# Patient Record
Sex: Male | Born: 1935
Health system: Southern US, Community
[De-identification: ages and names within clinical notes are randomized; demographics above are authoritative.]

## PROBLEM LIST (undated history)

## (undated) DIAGNOSIS — H9190 Unspecified hearing loss, unspecified ear: Secondary | ICD-10-CM

## (undated) DIAGNOSIS — I639 Cerebral infarction, unspecified: Secondary | ICD-10-CM

## (undated) DIAGNOSIS — I10 Essential (primary) hypertension: Secondary | ICD-10-CM

## (undated) DIAGNOSIS — N289 Disorder of kidney and ureter, unspecified: Secondary | ICD-10-CM

## (undated) DIAGNOSIS — E7800 Pure hypercholesterolemia, unspecified: Secondary | ICD-10-CM

## (undated) DIAGNOSIS — H269 Unspecified cataract: Secondary | ICD-10-CM

## (undated) DIAGNOSIS — K469 Unspecified abdominal hernia without obstruction or gangrene: Secondary | ICD-10-CM

## (undated) DIAGNOSIS — K219 Gastro-esophageal reflux disease without esophagitis: Secondary | ICD-10-CM

## (undated) DIAGNOSIS — I5032 Chronic diastolic (congestive) heart failure: Secondary | ICD-10-CM

## (undated) HISTORY — PX: CHOLECYSTECTOMY: SHX55

## (undated) HISTORY — PX: EYE SURGERY: SHX253

## (undated) HISTORY — PX: HERNIA REPAIR: SHX51

## (undated) SURGERY — Surgical Case
Anesthesia: *Unknown

---

## 1998-05-05 ENCOUNTER — Emergency Department (HOSPITAL_COMMUNITY): Admission: EM | Admit: 1998-05-05 | Discharge: 1998-05-05 | Payer: Self-pay | Admitting: Emergency Medicine

## 1998-05-07 ENCOUNTER — Ambulatory Visit (HOSPITAL_COMMUNITY): Admission: RE | Admit: 1998-05-07 | Discharge: 1998-05-07 | Payer: Self-pay | Admitting: Emergency Medicine

## 1998-05-15 ENCOUNTER — Observation Stay (HOSPITAL_COMMUNITY): Admission: RE | Admit: 1998-05-15 | Discharge: 1998-05-16 | Payer: Self-pay | Admitting: *Deleted

## 2007-11-05 ENCOUNTER — Emergency Department (HOSPITAL_COMMUNITY): Admission: EM | Admit: 2007-11-05 | Discharge: 2007-11-05 | Payer: Self-pay | Admitting: Emergency Medicine

## 2007-11-16 ENCOUNTER — Emergency Department (HOSPITAL_COMMUNITY): Admission: EM | Admit: 2007-11-16 | Discharge: 2007-11-16 | Payer: Self-pay | Admitting: Emergency Medicine

## 2007-11-23 ENCOUNTER — Encounter: Payer: Self-pay | Admitting: Pulmonary Disease

## 2007-12-02 ENCOUNTER — Ambulatory Visit: Payer: Self-pay | Admitting: Pulmonary Disease

## 2007-12-02 DIAGNOSIS — J209 Acute bronchitis, unspecified: Secondary | ICD-10-CM

## 2007-12-02 DIAGNOSIS — K219 Gastro-esophageal reflux disease without esophagitis: Secondary | ICD-10-CM | POA: Insufficient documentation

## 2007-12-02 DIAGNOSIS — T17308A Unspecified foreign body in larynx causing other injury, initial encounter: Secondary | ICD-10-CM

## 2007-12-10 ENCOUNTER — Telehealth: Payer: Self-pay | Admitting: Pulmonary Disease

## 2007-12-22 ENCOUNTER — Ambulatory Visit: Payer: Self-pay | Admitting: Pulmonary Disease

## 2009-07-19 ENCOUNTER — Inpatient Hospital Stay (HOSPITAL_COMMUNITY): Admission: EM | Admit: 2009-07-19 | Discharge: 2009-07-24 | Payer: Self-pay | Admitting: Emergency Medicine

## 2009-07-19 ENCOUNTER — Ambulatory Visit: Payer: Self-pay | Admitting: Cardiovascular Disease

## 2009-07-20 ENCOUNTER — Encounter (INDEPENDENT_AMBULATORY_CARE_PROVIDER_SITE_OTHER): Payer: Self-pay | Admitting: Internal Medicine

## 2009-07-23 ENCOUNTER — Ambulatory Visit: Payer: Self-pay | Admitting: Physical Medicine & Rehabilitation

## 2009-08-20 ENCOUNTER — Telehealth: Payer: Self-pay | Admitting: Pulmonary Disease

## 2009-08-21 ENCOUNTER — Encounter: Payer: Self-pay | Admitting: Pulmonary Disease

## 2009-08-30 ENCOUNTER — Ambulatory Visit: Payer: Self-pay | Admitting: Pulmonary Disease

## 2009-09-05 ENCOUNTER — Encounter: Payer: Self-pay | Admitting: Pulmonary Disease

## 2009-09-13 ENCOUNTER — Encounter: Payer: Self-pay | Admitting: Pulmonary Disease

## 2010-10-24 NOTE — Miscellaneous (Signed)
Summary: D/C summary/[facility]  D/C summary/[facility]   Imported By: Lester Halesite 09/28/2009 09:48:21  _____________________________________________________________________  External Attachment:    Type:   Image     Comment:   External Document

## 2010-12-26 LAB — POCT I-STAT, CHEM 8
Calcium, Ion: 1.13 mmol/L (ref 1.12–1.32)
Chloride: 103 mEq/L (ref 96–112)
Creatinine, Ser: 1 mg/dL (ref 0.4–1.5)
Glucose, Bld: 118 mg/dL — ABNORMAL HIGH (ref 70–99)
HCT: 46 % (ref 39.0–52.0)
Hemoglobin: 15.6 g/dL (ref 13.0–17.0)
Potassium: 3.8 mEq/L (ref 3.5–5.1)
Sodium: 139 mEq/L (ref 135–145)
TCO2: 25 mmol/L (ref 0–100)

## 2010-12-26 LAB — URINE CULTURE

## 2010-12-26 LAB — CBC
HCT: 44.7 % (ref 39.0–52.0)
Hemoglobin: 15.4 g/dL (ref 13.0–17.0)
MCHC: 34.5 g/dL (ref 30.0–36.0)
MCV: 95.4 fL (ref 78.0–100.0)
Platelets: 254 10*3/uL (ref 150–400)

## 2010-12-26 LAB — URINALYSIS, ROUTINE W REFLEX MICROSCOPIC
Bilirubin Urine: NEGATIVE
Hgb urine dipstick: NEGATIVE
Ketones, ur: NEGATIVE mg/dL
Nitrite: NEGATIVE

## 2010-12-26 LAB — DIFFERENTIAL
Eosinophils Absolute: 0 10*3/uL (ref 0.0–0.7)
Eosinophils Relative: 0 % (ref 0–5)

## 2010-12-26 LAB — LIPID PANEL
Triglycerides: 125 mg/dL (ref <150)
VLDL: 25 mg/dL (ref 0–40)

## 2010-12-26 LAB — HEMOGLOBIN A1C: Hgb A1c MFr Bld: 6 % (ref 4.6–6.1)

## 2010-12-26 LAB — PROTIME-INR
INR: 1.1 (ref 0.00–1.49)
Prothrombin Time: 14.1 seconds (ref 11.6–15.2)

## 2011-06-13 LAB — URINALYSIS, ROUTINE W REFLEX MICROSCOPIC
Glucose, UA: NEGATIVE
Hgb urine dipstick: NEGATIVE
Leukocytes, UA: NEGATIVE
Protein, ur: 100 — AB
pH: 5.5

## 2011-06-13 LAB — DIFFERENTIAL
Basophils Relative: 2 — ABNORMAL HIGH
Eosinophils Relative: 0
Lymphocytes Relative: 3 — ABNORMAL LOW
Monocytes Absolute: 0.5
Monocytes Relative: 3

## 2011-06-13 LAB — COMPREHENSIVE METABOLIC PANEL
ALT: 69 — ABNORMAL HIGH
Alkaline Phosphatase: 75
BUN: 22
CO2: 28
GFR calc Af Amer: >60
Potassium: 4.5

## 2011-06-13 LAB — URINE MICROSCOPIC-ADD ON

## 2011-06-13 LAB — CBC
MCHC: 34.7
MCV: 91.7
Platelets: 358
WBC: 13.5 — ABNORMAL HIGH

## 2012-06-08 ENCOUNTER — Emergency Department (HOSPITAL_COMMUNITY)
Admission: EM | Admit: 2012-06-08 | Discharge: 2012-06-08 | Disposition: A | Discharge status: Home / Self Care | Payer: Medicare Other | Attending: Emergency Medicine | Admitting: Emergency Medicine

## 2012-06-08 ENCOUNTER — Emergency Department (HOSPITAL_COMMUNITY): Payer: Medicare Other

## 2012-06-08 ENCOUNTER — Encounter (HOSPITAL_COMMUNITY): Payer: Self-pay | Admitting: *Deleted

## 2012-06-08 DIAGNOSIS — Z9089 Acquired absence of other organs: Secondary | ICD-10-CM | POA: Insufficient documentation

## 2012-06-08 DIAGNOSIS — Z8673 Personal history of transient ischemic attack (TIA), and cerebral infarction without residual deficits: Secondary | ICD-10-CM | POA: Insufficient documentation

## 2012-06-08 DIAGNOSIS — Z7982 Long term (current) use of aspirin: Secondary | ICD-10-CM | POA: Insufficient documentation

## 2012-06-08 DIAGNOSIS — R109 Unspecified abdominal pain: Secondary | ICD-10-CM | POA: Insufficient documentation

## 2012-06-08 DIAGNOSIS — Z79899 Other long term (current) drug therapy: Secondary | ICD-10-CM | POA: Insufficient documentation

## 2012-06-08 HISTORY — DX: Unspecified abdominal hernia without obstruction or gangrene: K46.9

## 2012-06-08 HISTORY — DX: Cerebral infarction, unspecified: I63.9

## 2012-06-08 HISTORY — DX: Disorder of kidney and ureter, unspecified: N28.9

## 2012-06-08 HISTORY — DX: Gastro-esophageal reflux disease without esophagitis: K21.9

## 2012-06-08 HISTORY — DX: Unspecified hearing loss, unspecified ear: H91.90

## 2012-06-08 HISTORY — DX: Unspecified cataract: H26.9

## 2012-06-08 HISTORY — DX: Pure hypercholesterolemia, unspecified: E78.00

## 2012-06-08 LAB — URINALYSIS, ROUTINE W REFLEX MICROSCOPIC
Bilirubin Urine: NEGATIVE
Glucose, UA: NEGATIVE mg/dL
Hgb urine dipstick: NEGATIVE
Ketones, ur: NEGATIVE mg/dL
Leukocytes, UA: NEGATIVE
Nitrite: NEGATIVE
Protein, ur: NEGATIVE mg/dL
Specific Gravity, Urine: 1.01 (ref 1.005–1.030)
Urobilinogen, UA: 0.2 mg/dL (ref 0.0–1.0)
pH: 6 (ref 5.0–8.0)

## 2012-06-08 LAB — CBC WITH DIFFERENTIAL/PLATELET
Basophils Absolute: 0 10*3/uL (ref 0.0–0.1)
Basophils Relative: 0 % (ref 0–1)
Eosinophils Absolute: 0.1 10*3/uL (ref 0.0–0.7)
Eosinophils Relative: 1 % (ref 0–5)
HCT: 42.8 % (ref 39.0–52.0)
Hemoglobin: 15.4 g/dL (ref 13.0–17.0)
Lymphocytes Relative: 26 % (ref 12–46)
Lymphs Abs: 2.1 10*3/uL (ref 0.7–4.0)
MCH: 32.6 pg (ref 26.0–34.0)
MCHC: 36 g/dL (ref 30.0–36.0)
MCV: 90.7 fL (ref 78.0–100.0)
Monocytes Absolute: 0.8 10*3/uL (ref 0.1–1.0)
Monocytes Relative: 10 % (ref 3–12)
Neutro Abs: 5.1 10*3/uL (ref 1.7–7.7)
Neutrophils Relative %: 63 % (ref 43–77)
Platelets: 268 10*3/uL (ref 150–400)
RBC: 4.72 MIL/uL (ref 4.22–5.81)
RDW: 12.5 % (ref 11.5–15.5)
WBC: 8.2 10*3/uL (ref 4.0–10.5)

## 2012-06-08 LAB — LIPASE, BLOOD: Lipase: 28 U/L (ref 11–59)

## 2012-06-08 LAB — CREATININE, SERUM
Creatinine, Ser: 0.89 mg/dL (ref 0.50–1.35)
GFR calc Af Amer: 89 mL/min — ABNORMAL LOW (ref >90)
GFR calc non Af Amer: 76 mL/min — ABNORMAL LOW (ref >90)

## 2012-06-08 LAB — BUN: BUN: 15 mg/dL (ref 6–23)

## 2012-06-08 MED ORDER — IOHEXOL 300 MG/ML  SOLN
100.0000 mL | Freq: Once | INTRAMUSCULAR | Status: AC | PRN
  Administered 2012-06-08: 100 mL via INTRAVENOUS

## 2012-06-08 NOTE — ED Notes (Signed)
Pt denies n/v

## 2012-06-08 NOTE — ED Notes (Signed)
Pt from home with reports of right lower abdominal pain that started about 3 days ago. Pt reports that he has his appendix but gallbladder has been removed. Pt also endorses hx of strangulated hernia on same side. Pt denies N/V/D.

## 2012-06-08 NOTE — ED Notes (Signed)
Took temp five times with two thermo mers. notified triage RN

## 2012-06-08 NOTE — ED Notes (Signed)
Patient transported to CT 

## 2012-06-18 NOTE — ED Provider Notes (Signed)
History    78 year old male with abdominal pain. Gradual onset about 3 days ago. Relatively persistent since. Pain is across lower abdomen, but worse on the right side. Past history significant for hernia repair. Triage note mentions strangulated hernia but patient's description is not consistent with this. He is status post cholecystectomy. He thinks he still has his appendix. No urinary complaints. No fevers or chills. No nausea or vomiting. CSN: [id number]  Arrival date & time 06/08/12  3086   First MD Initiated Contact with Patient 06/08/12 0900      Chief Complaint  Patient presents with  . Abdominal Pain    right lower    (Consider location/radiation/quality/duration/timing/severity/associated sxs/prior treatment) HPI  Past Medical History  Diagnosis Date  . Stroke   . Renal disorder   . Hernia   . High cholesterol   . Hearing loss   . GERD (gastroesophageal reflux disease)   . Cataracts, bilateral     Past Surgical History  Procedure Date  . Cholecystectomy   . Hernia repair   . Eye surgery     History reviewed. No pertinent family history.  History  Substance Use Topics  . Smoking status: Former Smoker    Quit date: 06/08/1962  . Smokeless tobacco: Never Used  . Alcohol Use: No      Review of Systems  Allergies  Review of patient's allergies indicates no known allergies.  Home Medications   Current Outpatient Rx  Name Route Sig Dispense Refill  . ASPIRIN 325 MG PO TABS Oral Take 325 mg by mouth daily.    Marland Kitchen VITAMIN D 1000 UNITS PO TABS Oral Take 1,000 Units by mouth daily.    Marland Kitchen CINNAMON 500 MG PO CAPS Oral Take 500 mg by mouth daily.    Marland Kitchen GARLIC PO Oral Take 1 tablet by mouth daily.    Marland Kitchen GLUCOSAMINE-CHONDROITIN 500-400 MG PO TABS Oral Take 1 tablet by mouth 3 (three) times daily.    Marland Kitchen OVER THE COUNTER MEDICATION  Mixes vinegar and grape juice in small glass and drinks at night-for metabolism    . SAW PALMETTO (SERENOA REPENS) 500 MG PO CAPS Oral  Take 500 mg by mouth daily.    Marland Kitchen SIMVASTATIN 40 MG PO TABS Oral Take 40 mg by mouth every evening.      BP 156/83  Pulse 78  Temp 92.3 F (33.5 C)  Resp 16  SpO2 98%  Physical Exam  Nursing note and vitals reviewed. Constitutional: He appears well-developed and well-nourished. No distress.  HENT:  Head: Normocephalic and atraumatic.  Eyes: Conjunctivae normal are normal. Right eye exhibits no discharge. Left eye exhibits no discharge.  Neck: Neck supple.  Cardiovascular: Normal rate, regular rhythm and normal heart sounds.  Exam reveals no gallop and no friction rub.   No murmur heard. Pulmonary/Chest: Effort normal and breath sounds normal. No respiratory distress.  Abdominal: Soft. He exhibits no distension. There is tenderness.       Well-healed surgical scars. No distention. Moderate tenderness suprapubically and right lower quadrant. No rebound or guarding. No hernia appreciated.  Genitourinary:       No CVA tenderness. Normal appearing external male genitalia. Normal eye testicles. No tenderness. No concerning lesions noted.  Musculoskeletal: He exhibits no edema and no tenderness.  Neurological: He is alert.  Skin: Skin is warm and dry.  Psychiatric: He has a normal mood and affect. His behavior is normal. Thought content normal.    ED Course  Procedures (including critical care  time)  Labs Reviewed  URINALYSIS, ROUTINE W REFLEX MICROSCOPIC - Abnormal; Notable for the following:    APPearance CLOUDY (*)     All other components within normal limits  CREATININE, SERUM - Abnormal; Notable for the following:    GFR calc non Af Amer 76 (*)     GFR calc Af Amer 89 (*)     All other components within normal limits  CBC WITH DIFFERENTIAL  LIPASE, BLOOD  BUN  LAB REPORT - SCANNED   No results found.   1. Abdominal pain       MDM  78 year old male with abdominal pain.: Mild tenderness on exam. Workup fairly unremarkable. Imaging without evidence of urgent acute  pathology. Patient noted to be hypothermic. he's not complaining of fevers or chills though. Consider infectious etiology, but doubt. Is hemodynamically stable otherwise.        Raeford Razor, MD 06/18/12 1159

## 2012-12-08 ENCOUNTER — Telehealth: Payer: Self-pay | Admitting: Family Medicine

## 2012-12-08 MED ORDER — SIMVASTATIN 40 MG PO TABS: 40.0000 mg | ORAL_TABLET | Freq: Every evening | ORAL | Status: DC

## 2012-12-08 NOTE — Telephone Encounter (Signed)
Done

## 2013-03-28 ENCOUNTER — Other Ambulatory Visit: Payer: Self-pay | Admitting: Family Medicine

## 2013-04-26 ENCOUNTER — Telehealth: Payer: Self-pay | Admitting: Family Medicine

## 2013-04-26 MED ORDER — SIMVASTATIN 40 MG PO TABS: ORAL_TABLET | ORAL | Status: DC

## 2013-04-26 NOTE — Telephone Encounter (Signed)
Rx Refilled  

## 2013-04-27 ENCOUNTER — Telehealth: Payer: Self-pay | Admitting: Family Medicine

## 2013-04-27 NOTE — Telephone Encounter (Signed)
Done 04/26/13

## 2013-04-28 MED ORDER — SIMVASTATIN 40 MG PO TABS: ORAL_TABLET | ORAL | Status: DC

## 2013-08-20 ENCOUNTER — Other Ambulatory Visit: Payer: Self-pay | Admitting: Family Medicine

## 2013-08-25 ENCOUNTER — Other Ambulatory Visit: Payer: Self-pay | Admitting: Family Medicine

## 2013-09-23 ENCOUNTER — Other Ambulatory Visit: Payer: Medicare HMO

## 2013-09-23 DIAGNOSIS — Z79899 Other long term (current) drug therapy: Secondary | ICD-10-CM

## 2013-09-23 DIAGNOSIS — Z125 Encounter for screening for malignant neoplasm of prostate: Secondary | ICD-10-CM

## 2013-09-23 DIAGNOSIS — E785 Hyperlipidemia, unspecified: Secondary | ICD-10-CM

## 2013-09-23 DIAGNOSIS — I10 Essential (primary) hypertension: Secondary | ICD-10-CM

## 2013-09-23 LAB — COMPREHENSIVE METABOLIC PANEL
ALBUMIN: 4.2 g/dL (ref 3.5–5.2)
ALK PHOS: 71 U/L (ref 39–117)
ALT: 28 U/L (ref 0–53)
AST: 30 U/L (ref 0–37)
BILIRUBIN TOTAL: 0.9 mg/dL (ref 0.3–1.2)
BUN: 19 mg/dL (ref 6–23)
CO2: 28 mEq/L (ref 19–32)
Calcium: 9.6 mg/dL (ref 8.4–10.5)
Chloride: 102 mEq/L (ref 96–112)
Creat: 1 mg/dL (ref 0.50–1.35)
GLUCOSE: 94 mg/dL (ref 70–99)
POTASSIUM: 5.1 meq/L (ref 3.5–5.3)
SODIUM: 140 meq/L (ref 135–145)
TOTAL PROTEIN: 7.5 g/dL (ref 6.0–8.3)

## 2013-09-23 LAB — CBC WITH DIFFERENTIAL/PLATELET
BASOS ABS: 0 10*3/uL (ref 0.0–0.1)
BASOS PCT: 0 % (ref 0–1)
EOS ABS: 0.1 10*3/uL (ref 0.0–0.7)
Eosinophils Relative: 1 % (ref 0–5)
HCT: 44.8 % (ref 39.0–52.0)
HEMOGLOBIN: 15.6 g/dL (ref 13.0–17.0)
Lymphocytes Relative: 31 % (ref 12–46)
Lymphs Abs: 2.5 10*3/uL (ref 0.7–4.0)
MCH: 32.4 pg (ref 26.0–34.0)
MCHC: 34.8 g/dL (ref 30.0–36.0)
MCV: 92.9 fL (ref 78.0–100.0)
Monocytes Absolute: 1 10*3/uL (ref 0.1–1.0)
Monocytes Relative: 13 % — ABNORMAL HIGH (ref 3–12)
NEUTROS ABS: 4.5 10*3/uL (ref 1.7–7.7)
NEUTROS PCT: 55 % (ref 43–77)
PLATELETS: 280 10*3/uL (ref 150–400)
RBC: 4.82 MIL/uL (ref 4.22–5.81)
RDW: 13 % (ref 11.5–15.5)
WBC: 8.1 10*3/uL (ref 4.0–10.5)

## 2013-09-23 LAB — LIPID PANEL
Cholesterol: 139 mg/dL (ref 0–200)
HDL: 38 mg/dL — ABNORMAL LOW (ref >39)
LDL CALC: 80 mg/dL (ref 0–99)
TRIGLYCERIDES: 107 mg/dL (ref <150)
Total CHOL/HDL Ratio: 3.7 Ratio
VLDL: 21 mg/dL (ref 0–40)

## 2013-09-23 LAB — PSA, MEDICARE: PSA: 5.68 ng/mL — AB (ref <=4.00)

## 2013-09-29 ENCOUNTER — Ambulatory Visit (INDEPENDENT_AMBULATORY_CARE_PROVIDER_SITE_OTHER): Payer: Medicare HMO | Admitting: Family Medicine

## 2013-09-29 ENCOUNTER — Encounter: Payer: Self-pay | Admitting: Family Medicine

## 2013-09-29 VITALS — BP 132/76 | HR 76 | Temp 96.4°F | Resp 18 | Ht 64.0 in | Wt 167.0 lb

## 2013-09-29 DIAGNOSIS — Z0000 Encounter for general adult medical examination without abnormal findings: Secondary | ICD-10-CM

## 2013-09-29 NOTE — Progress Notes (Signed)
Subjective:    Patient ID: Eric Berger, male    DOB: Jan 25, 1926, 80 y.o.   MRN: [id number]  HPI Patient is an 80 year old gentleman with a history of stroke who presents today for complete physical exam. He is followed by urology. He sees Dr. Jasmine December. His PSA was elevated on his most recent lab work. However he is asymptomatic. Given his age I feel like it is reasonable just to monitor this for now. Also given his age I would not recommend a colonoscopy at this point. The patient is in agreement with that. He is due for a flu vaccine as well as the pneumonia vaccine. He declines both of these vaccinations. Overall he has been doing well. He has no concerns. His most recent labwork as listed below: Lab on 09/23/2013  Component Date Value Range Status  . WBC 09/23/2013 8.1  4.0 - 10.5 K/uL Final  . RBC 09/23/2013 4.82  4.22 - 5.81 MIL/uL Final  . Hemoglobin 09/23/2013 15.6  13.0 - 17.0 g/dL Final  . HCT 09/23/2013 44.8  39.0 - 52.0 % Final  . MCV 09/23/2013 92.9  78.0 - 100.0 fL Final  . MCH 09/23/2013 32.4  26.0 - 34.0 pg Final  . MCHC 09/23/2013 34.8  30.0 - 36.0 g/dL Final  . RDW 09/23/2013 13.0  11.5 - 15.5 % Final  . Platelets 09/23/2013 280  150 - 400 K/uL Final  . Neutrophils Relative % 09/23/2013 55  43 - 77 % Final  . Neutro Abs 09/23/2013 4.5  1.7 - 7.7 K/uL Final  . Lymphocytes Relative 09/23/2013 31  12 - 46 % Final  . Lymphs Abs 09/23/2013 2.5  0.7 - 4.0 K/uL Final  . Monocytes Relative 09/23/2013 13* 3 - 12 % Final  . Monocytes Absolute 09/23/2013 1.0  0.1 - 1.0 K/uL Final  . Eosinophils Relative 09/23/2013 1  0 - 5 % Final  . Eosinophils Absolute 09/23/2013 0.1  0.0 - 0.7 K/uL Final  . Basophils Relative 09/23/2013 0  0 - 1 % Final  . Basophils Absolute 09/23/2013 0.0  0.0 - 0.1 K/uL Final  . Smear Review 09/23/2013 Criteria for review not met   Final  . Sodium 09/23/2013 140  135 - 145 mEq/L Final  . Potassium 09/23/2013 5.1  3.5 - 5.3 mEq/L Final  . Chloride  09/23/2013 102  96 - 112 mEq/L Final  . CO2 09/23/2013 28  19 - 32 mEq/L Final  . Glucose, Bld 09/23/2013 94  70 - 99 mg/dL Final  . BUN 09/23/2013 19  6 - 23 mg/dL Final  . Creat 09/23/2013 1.00  0.50 - 1.35 mg/dL Final  . Total Bilirubin 09/23/2013 0.9  0.3 - 1.2 mg/dL Final  . Alkaline Phosphatase 09/23/2013 71  39 - 117 U/L Final  . AST 09/23/2013 30  0 - 37 U/L Final  . ALT 09/23/2013 28  0 - 53 U/L Final  . Total Protein 09/23/2013 7.5  6.0 - 8.3 g/dL Final  . Albumin 09/23/2013 4.2  3.5 - 5.2 g/dL Final  . Calcium 09/23/2013 9.6  8.4 - 10.5 mg/dL Final  . Cholesterol 09/23/2013 139  0 - 200 mg/dL Final   Comment: ATP III Classification:                                < 200        mg/dL  Desirable                               200 - 239     mg/dL        Borderline High                               >= 240        mg/dL        High                             . Triglycerides 09/23/2013 107  <150 mg/dL Final  . HDL 09/23/2013 38* >39 mg/dL Final  . Total CHOL/HDL Ratio 09/23/2013 3.7   Final  . VLDL 09/23/2013 21  0 - 40 mg/dL Final  . LDL Cholesterol 09/23/2013 80  0 - 99 mg/dL Final   Comment:                            Total Cholesterol/HDL Ratio:CHD Risk                                                 Coronary Heart Disease Risk Table                                                                 Men       Women                                   1/2 Average Risk              3.4        3.3                                       Average Risk              5.0        4.4                                    2X Average Risk              9.6        7.1                                    3X Average Risk             23.4       11.0                          Use the calculated Patient  Ratio above and the CHD Risk table                           to determine the patient's CHD Risk.                          ATP III Classification (LDL):                                < 100         mg/dL         Optimal                               100 - 129     mg/dL         Near or Above Optimal                               130 - 159     mg/dL         Borderline High                               160 - 189     mg/dL         High                                > 190        mg/dL         Very High                             . PSA 09/23/2013 5.68* <=4.00 ng/mL Final   Comment: Test Methodology: ECLIA PSA (Electrochemiluminescence Immunoassay)                                                     For PSA values from 2.5-4.0, particularly in younger men <60 years                          old, the AUA and NCCN suggest testing for % Free PSA (3515) and                          evaluation of the rate of increase in PSA (PSA velocity).   Past Medical History  Diagnosis Date  . Stroke   . Renal disorder   . Hernia   . High cholesterol   . Hearing loss   . GERD (gastroesophageal reflux disease)   . Cataracts, bilateral    Current Outpatient Prescriptions on File Prior to Visit  Medication Sig Dispense Refill  . aspirin 325 MG tablet Take 325 mg by mouth daily.      . cholecalciferol (VITAMIN D) 1000 UNITS tablet Take 1,000 Units by mouth daily.      . Cinnamon 500 MG capsule Take 500 mg by mouth daily.      Marland Kitchen  GARLIC PO Take 1 tablet by mouth daily.      Marland Kitchen glucosamine-chondroitin 500-400 MG tablet Take 1 tablet by mouth 3 (three) times daily.      Marland Kitchen OVER THE COUNTER MEDICATION Mixes vinegar and grape juice in small glass and drinks at night-for metabolism      . saw palmetto 500 MG capsule Take 500 mg by mouth daily.      . simvastatin (ZOCOR) 40 MG tablet TAKE 1 TABLET BY MOUTH EVERY EVENING.*NEEDS OFFICE VISIT AND BLOOD WORK*  90 tablet  0   No current facility-administered medications on file prior to visit.   No Known Allergies History   Social History  . Marital Status: Married    Spouse Name: N/A    Number of Children: N/A  . Years of Education: N/A   Occupational  History  . Not on file.   Social History Main Topics  . Smoking status: Former Smoker    Quit date: 06/08/1962  . Smokeless tobacco: Never Used  . Alcohol Use: No  . Drug Use: No  . Sexual Activity: Yes     Comment: Married.  Retired.   Other Topics Concern  . Not on file   Social History Narrative  . No narrative on file      Review of Systems  All other systems reviewed and are negative.       Objective:   Physical Exam  Vitals reviewed. Constitutional: He is oriented to person, place, and time. He appears well-developed and well-nourished. No distress.  HENT:  Head: Normocephalic and atraumatic.  Right Ear: External ear normal.  Left Ear: External ear normal.  Nose: Nose normal.  Mouth/Throat: Oropharynx is clear and moist. No oropharyngeal exudate.  Eyes: Conjunctivae and EOM are normal. Pupils are equal, round, and reactive to light. Right eye exhibits no discharge. Left eye exhibits no discharge. No scleral icterus.  Neck: Normal range of motion. Neck supple. No JVD present. No tracheal deviation present. No thyromegaly present.  Cardiovascular: Normal rate, regular rhythm, normal heart sounds and intact distal pulses.  Exam reveals no gallop and no friction rub.   No murmur heard. Pulmonary/Chest: Breath sounds normal. No stridor. No respiratory distress. He has no wheezes. He has no rales. He exhibits no tenderness.  Abdominal: Soft. Bowel sounds are normal. He exhibits no distension and no mass. There is no tenderness. There is no rebound and no guarding.  Musculoskeletal: Normal range of motion. He exhibits no edema and no tenderness.  Lymphadenopathy:    He has no cervical adenopathy.  Neurological: He is alert and oriented to person, place, and time. He displays normal reflexes. No cranial nerve deficit. He exhibits normal muscle tone. Coordination normal.  Skin: Skin is warm. No rash noted. He is not diaphoretic. No erythema. No pallor.  Psychiatric: He  has a normal mood and affect. His behavior is normal. Judgment and thought content normal.          Assessment & Plan:  1. Routine general medical examination at a health care facility The patient's labs are significant for an HDL of 38. I recommended that he increase his regular aerobic exercise to address this. He also has an elevated PSA. However given his age we have determined clinically monitor this for the present time. He is not an appropriate candidate for colonoscopy. He declines pneumonia vaccine as well as the flu shot. The remainder of his preventive care is up to date. The remainder of his labs look  excellent. He is to follow up in 6 months or as needed. He can suddenly return any time for the flu vaccine and pneumonia vaccine that he wishes.  I also recommended a zostavax but he declines that as well

## 2013-11-20 ENCOUNTER — Other Ambulatory Visit: Payer: Self-pay | Admitting: Family Medicine

## 2013-11-24 ENCOUNTER — Other Ambulatory Visit: Payer: Self-pay | Admitting: Family Medicine

## 2013-11-24 DIAGNOSIS — E785 Hyperlipidemia, unspecified: Secondary | ICD-10-CM

## 2013-12-26 ENCOUNTER — Encounter: Payer: Self-pay | Admitting: Family Medicine

## 2013-12-26 ENCOUNTER — Ambulatory Visit (INDEPENDENT_AMBULATORY_CARE_PROVIDER_SITE_OTHER): Payer: Medicare HMO | Admitting: Family Medicine

## 2013-12-26 VITALS — BP 140/60 | HR 62 | Temp 96.7°F | Resp 18 | Ht 64.0 in | Wt 166.0 lb

## 2013-12-26 DIAGNOSIS — L723 Sebaceous cyst: Secondary | ICD-10-CM

## 2013-12-26 NOTE — Progress Notes (Signed)
Subjective:    Patient ID: Eric Berger, male    DOB: 09-22-1926, 80 y.o.   MRN: [id number]  HPI  Patient has had a sebaceous cyst around the level of T2 in the center of his upper back for 25 years. The lesion is approximately 2 cm in size. The patient is requesting excision of this lesion. There is no evidence of erythema or infection. There is a large central keratinaceous plug. Past Medical History  Diagnosis Date  . Stroke   . Renal disorder   . Hernia   . High cholesterol   . Hearing loss   . GERD (gastroesophageal reflux disease)   . Cataracts, bilateral    Past Surgical History  Procedure Laterality Date  . Cholecystectomy    . Hernia repair    . Eye surgery     Current Outpatient Prescriptions on File Prior to Visit  Medication Sig Dispense Refill  . aspirin 325 MG tablet Take 325 mg by mouth daily.      . cholecalciferol (VITAMIN D) 1000 UNITS tablet Take 1,000 Units by mouth daily.      . Cinnamon 500 MG capsule Take 500 mg by mouth daily.      Marland Kitchen GARLIC PO Take 1 tablet by mouth daily.      Marland Kitchen glucosamine-chondroitin 500-400 MG tablet Take 1 tablet by mouth 3 (three) times daily.      . lansoprazole (PREVACID) 15 MG capsule Take 15 mg by mouth daily at 12 noon.      Marland Kitchen OVER THE COUNTER MEDICATION Mixes vinegar and grape juice in small glass and drinks at night-for metabolism      . saw palmetto 500 MG capsule Take 500 mg by mouth daily.      . simvastatin (ZOCOR) 40 MG tablet Take 1 tablet (40 mg total) by mouth daily at 6 PM.  90 tablet  1  . simvastatin (ZOCOR) 40 MG tablet TAKE 1 TABLET BY MOUTH EVERY EVENING.  30 tablet  5   No current facility-administered medications on file prior to visit.   No Known Allergies\ History   Social History  . Marital Status: Married    Spouse Name: N/A    Number of Children: N/A  . Years of Education: N/A   Occupational History  . Not on file.   Social History Main Topics  . Smoking status: Former Smoker   Quit date: 06/08/1962  . Smokeless tobacco: Never Used  . Alcohol Use: No  . Drug Use: No  . Sexual Activity: Yes     Comment: Married.  Retired.   Other Topics Concern  . Not on file   Social History Narrative  . No narrative on file    Review of Systems  All other systems reviewed and are negative.       Objective:   Physical Exam  Vitals reviewed. Cardiovascular: Normal rate and regular rhythm.   Pulmonary/Chest: Effort normal and breath sounds normal.   2 cm sebaceous cyst in the center of the upper back around the level of T2        Assessment & Plan:  1. Sebaceous cyst After obtaining informed consent the area was anesthetized with 0.1% lidocaine without epinephrine. It was then prepped and draped in sterile fashion. A 1 cm x 2 cm elliptical excision was performed down to the subcutaneous fat and fascia.  The cyst sac was removed in its entirety. The subcutaneous fascia was then approximated using 2 simple interrupted 3-0  Vicryl sutures. The skin was then closed using 4 simple interrupted 3-0 Ethilon sutures. Blood loss was minimal. The patient tolerated the procedure well without complication. The wound was then dressed. Return in 10 days for suture removal or sooner if complications arise

## 2014-01-05 ENCOUNTER — Ambulatory Visit (INDEPENDENT_AMBULATORY_CARE_PROVIDER_SITE_OTHER): Payer: Medicare HMO | Admitting: Family Medicine

## 2014-01-05 ENCOUNTER — Encounter: Payer: Self-pay | Admitting: Family Medicine

## 2014-01-05 VITALS — BP 140/70 | HR 76 | Temp 97.8°F | Resp 16 | Ht 64.0 in | Wt 167.0 lb

## 2014-01-05 DIAGNOSIS — Z4802 Encounter for removal of sutures: Secondary | ICD-10-CM

## 2014-01-05 DIAGNOSIS — Q619 Cystic kidney disease, unspecified: Secondary | ICD-10-CM

## 2014-01-05 DIAGNOSIS — N281 Cyst of kidney, acquired: Secondary | ICD-10-CM

## 2014-01-05 NOTE — Progress Notes (Signed)
Subjective:    Patient ID: Eric Berger, male    DOB: 1926-02-10, 80 y.o.   MRN: [id number]  HPI  12/26/13 Patient has had a sebaceous cyst around the level of T2 in the center of his upper back for 25 years. The lesion is approximately 2 cm in size. The patient is requesting excision of this lesion. There is no evidence of erythema or infection. There is a large central keratinaceous plug.  At that time, my plan was: 1. Sebaceous cyst After obtaining informed consent the area was anesthetized with 0.1% lidocaine without epinephrine. It was then prepped and draped in sterile fashion. A 1 cm x 2 cm elliptical excision was performed down to the subcutaneous fat and fascia.  The cyst sac was removed in its entirety. The subcutaneous fascia was then approximated using 2 simple interrupted 3-0 Vicryl sutures. The skin was then closed using 4 simple interrupted 3-0 Ethilon sutures. Blood loss was minimal. The patient tolerated the procedure well without complication. The wound was then dressed. Return in 10 days for suture removal or sooner if complications arise  01/05/14 Patient here for suture removal.  No evidence of cellulitis or infection.  Wound appears clean, dry, and intact.   Patient also had a history of a left renal cyst is Bosniak category 2 last seen on CT scan in 2013. He has been seeing urology who has been following that. He is requesting a repeat ultrasound to monitor the progression of the cyst.  He is asymtpomatic.  He denies hematuria, dysuria, frequency, or low-back pain.   Past Medical History  Diagnosis Date  . Stroke   . Renal disorder   . Hernia   . High cholesterol   . Hearing loss   . GERD (gastroesophageal reflux disease)   . Cataracts, bilateral    Past Surgical History  Procedure Laterality Date  . Cholecystectomy    . Hernia repair    . Eye surgery     Current Outpatient Prescriptions on File Prior to Visit  Medication Sig Dispense Refill  . aspirin  325 MG tablet Take 325 mg by mouth daily.      . cholecalciferol (VITAMIN D) 1000 UNITS tablet Take 1,000 Units by mouth daily.      . Cinnamon 500 MG capsule Take 500 mg by mouth daily.      Marland Kitchen. GARLIC PO Take 1 tablet by mouth daily.      Marland Kitchen. glucosamine-chondroitin 500-400 MG tablet Take 1 tablet by mouth 3 (three) times daily.      . lansoprazole (PREVACID) 15 MG capsule Take 15 mg by mouth daily at 12 noon.      Marland Kitchen. OVER THE COUNTER MEDICATION Mixes vinegar and grape juice in small glass and drinks at night-for metabolism      . saw palmetto 500 MG capsule Take 500 mg by mouth daily.      . simvastatin (ZOCOR) 40 MG tablet Take 1 tablet (40 mg total) by mouth daily at 6 PM.  90 tablet  1  . simvastatin (ZOCOR) 40 MG tablet TAKE 1 TABLET BY MOUTH EVERY EVENING.  30 tablet  5   No current facility-administered medications on file prior to visit.   No Known Allergies\ History   Social History  . Marital Status: Married    Spouse Name: N/A    Number of Children: N/A  . Years of Education: N/A   Occupational History  . Not on file.   Social History Main  Topics  . Smoking status: Former Smoker    Quit date: 06/08/1962  . Smokeless tobacco: Never Used  . Alcohol Use: No  . Drug Use: No  . Sexual Activity: Yes     Comment: Married.  Retired.   Other Topics Concern  . Not on file   Social History Narrative  . No narrative on file    Review of Systems  All other systems reviewed and are negative.      Objective:   Physical Exam  Vitals reviewed. Cardiovascular: Normal rate and regular rhythm.   Pulmonary/Chest: Effort normal and breath sounds normal.  Wound is clean dry and intact without evidence of cellulitis.       Assessment & Plan:  1. Visit for suture removal 4 sutures removed without difficulty.  Healing incision reinforced with steri strips.  Wound cared discussed.  2. Renal cyst, left I tried to reassure the patient that no further followup is needed for the  cyst. He is still requesting a repeat ultrasound of the cyst because his brother recently was diagnosed with kidney cancer. Therefore I will schedule an ultrasound of the patient's left renal cyst the monitor for any signs of depression or malignancy.   - US Renal; Future

## 2014-01-09 ENCOUNTER — Ambulatory Visit
Admission: RE | Admit: 2014-01-09 | Discharge: 2014-01-09 | Disposition: A | Discharge status: Home / Self Care | Payer: Commercial Managed Care - HMO | Source: Ambulatory Visit | Attending: Family Medicine | Admitting: Family Medicine

## 2014-01-09 DIAGNOSIS — N281 Cyst of kidney, acquired: Secondary | ICD-10-CM

## 2014-05-18 ENCOUNTER — Other Ambulatory Visit: Payer: Self-pay | Admitting: Family Medicine

## 2014-05-18 NOTE — Telephone Encounter (Signed)
Refill appropriate and filled per protocol. 

## 2014-10-06 ENCOUNTER — Ambulatory Visit (INDEPENDENT_AMBULATORY_CARE_PROVIDER_SITE_OTHER): Payer: Commercial Managed Care - HMO | Admitting: Family Medicine

## 2014-10-06 ENCOUNTER — Encounter: Payer: Self-pay | Admitting: Family Medicine

## 2014-10-06 VITALS — BP 150/82 | HR 68 | Temp 97.4°F | Resp 14 | Ht 64.0 in | Wt 162.0 lb

## 2014-10-06 DIAGNOSIS — I10 Essential (primary) hypertension: Secondary | ICD-10-CM | POA: Diagnosis not present

## 2014-10-06 DIAGNOSIS — Z79899 Other long term (current) drug therapy: Secondary | ICD-10-CM | POA: Diagnosis not present

## 2014-10-06 DIAGNOSIS — Z0000 Encounter for general adult medical examination without abnormal findings: Secondary | ICD-10-CM

## 2014-10-06 LAB — COMPLETE METABOLIC PANEL WITH GFR
ALBUMIN: 4.3 g/dL (ref 3.5–5.2)
ALT: 34 U/L (ref 0–53)
AST: 32 U/L (ref 0–37)
Alkaline Phosphatase: 74 U/L (ref 39–117)
BILIRUBIN TOTAL: 0.9 mg/dL (ref 0.2–1.2)
BUN: 17 mg/dL (ref 6–23)
CO2: 27 meq/L (ref 19–32)
Calcium: 9.9 mg/dL (ref 8.4–10.5)
Chloride: 103 mEq/L (ref 96–112)
Creat: 0.97 mg/dL (ref 0.50–1.35)
GFR, Est African American: 81 mL/min
GFR, Est Non African American: 70 mL/min
Glucose, Bld: 92 mg/dL (ref 70–99)
Potassium: 5.3 mEq/L (ref 3.5–5.3)
SODIUM: 140 meq/L (ref 135–145)
Total Protein: 7.6 g/dL (ref 6.0–8.3)

## 2014-10-06 LAB — CBC WITH DIFFERENTIAL/PLATELET
Basophils Absolute: 0 10*3/uL (ref 0.0–0.1)
Basophils Relative: 0 % (ref 0–1)
EOS ABS: 0.1 10*3/uL (ref 0.0–0.7)
Eosinophils Relative: 1 % (ref 0–5)
HEMATOCRIT: 48.6 % (ref 39.0–52.0)
Hemoglobin: 16.5 g/dL (ref 13.0–17.0)
LYMPHS ABS: 2.5 10*3/uL (ref 0.7–4.0)
Lymphocytes Relative: 33 % (ref 12–46)
MCH: 32 pg (ref 26.0–34.0)
MCHC: 34 g/dL (ref 30.0–36.0)
MCV: 94.2 fL (ref 78.0–100.0)
MONOS PCT: 11 % (ref 3–12)
MPV: 10.6 fL (ref 8.6–12.4)
Monocytes Absolute: 0.8 10*3/uL (ref 0.1–1.0)
NEUTROS ABS: 4.2 10*3/uL (ref 1.7–7.7)
Neutrophils Relative %: 55 % (ref 43–77)
PLATELETS: 295 10*3/uL (ref 150–400)
RBC: 5.16 MIL/uL (ref 4.22–5.81)
RDW: 13.2 % (ref 11.5–15.5)
WBC: 7.6 10*3/uL (ref 4.0–10.5)

## 2014-10-06 LAB — LIPID PANEL
Cholesterol: 137 mg/dL (ref 0–200)
HDL: 41 mg/dL (ref >39)
LDL CALC: 69 mg/dL (ref 0–99)
TRIGLYCERIDES: 136 mg/dL (ref <150)
Total CHOL/HDL Ratio: 3.3 Ratio
VLDL: 27 mg/dL (ref 0–40)

## 2014-10-06 NOTE — Progress Notes (Signed)
Subjective:    Patient ID: Eric Berger, male    DOB: 1926-05-09, 81 y.o.   MRN: [id number]  HPI Patient is here today for complete physical exam. He has never had a colonoscopy. Due to his age I have recommended that he not proceed with a colonoscopy at this point. Also due to his age I recommended against prostate cancer screening. I did recommend a flu shot, pneumonia vaccine, and shingles vaccine all of which the patient refused even after a 10 minute discussion of the importance of these vaccinations given his age.  Otherwise the patient denies any complications or complaints. Past Medical History  Diagnosis Date  . Stroke   . Renal disorder   . Hernia   . High cholesterol   . Hearing loss   . GERD (gastroesophageal reflux disease)   . Cataracts, bilateral    Past Surgical History  Procedure Laterality Date  . Cholecystectomy    . Hernia repair    . Eye surgery     Current Outpatient Prescriptions on File Prior to Visit  Medication Sig Dispense Refill  . aspirin 325 MG tablet Take 325 mg by mouth daily.    . cholecalciferol (VITAMIN D) 1000 UNITS tablet Take 1,000 Units by mouth daily.    . Cinnamon 500 MG capsule Take 500 mg by mouth daily.    Marland Kitchen GARLIC PO Take 1 tablet by mouth daily.    Marland Kitchen glucosamine-chondroitin 500-400 MG tablet Take 1 tablet by mouth 3 (three) times daily.    . lansoprazole (PREVACID) 15 MG capsule Take 15 mg by mouth daily at 12 noon.    Marland Kitchen OVER THE COUNTER MEDICATION Mixes vinegar and grape juice in small glass and drinks at night-for metabolism    . saw palmetto 500 MG capsule Take 500 mg by mouth daily.    . simvastatin (ZOCOR) 40 MG tablet TAKE 1 TABLET (40 MG TOTAL) BY MOUTH DAILY AT 6 PM. 90 tablet 1   No current facility-administered medications on file prior to visit.   No Known Allergies History   Social History  . Marital Status: Married    Spouse Name: N/A    Number of Children: N/A  . Years of Education: N/A   Occupational  History  . Not on file.   Social History Main Topics  . Smoking status: Former Smoker    Quit date: 06/08/1962  . Smokeless tobacco: Never Used  . Alcohol Use: No  . Drug Use: No  . Sexual Activity: Yes     Comment: Married.  Retired.   Other Topics Concern  . Not on file   Social History Narrative   No family history on file.    Review of Systems  All other systems reviewed and are negative.      Objective:   Physical Exam  Constitutional: He is oriented to person, place, and time. He appears well-developed and well-nourished. No distress.  HENT:  Head: Normocephalic and atraumatic.  Right Ear: External ear normal.  Left Ear: External ear normal.  Nose: Nose normal.  Mouth/Throat: Oropharynx is clear and moist. No oropharyngeal exudate.  Eyes: Conjunctivae and EOM are normal. Pupils are equal, round, and reactive to light. Right eye exhibits no discharge. Left eye exhibits no discharge. No scleral icterus.  Neck: Normal range of motion. Neck supple. No JVD present. No tracheal deviation present. No thyromegaly present.  Cardiovascular: Normal rate, regular rhythm, normal heart sounds and intact distal pulses.  Exam reveals no gallop  and no friction rub.   No murmur heard. Pulmonary/Chest: Effort normal and breath sounds normal. No stridor. No respiratory distress. He has no wheezes. He has no rales. He exhibits no tenderness.  Abdominal: Soft. Bowel sounds are normal. He exhibits no distension and no mass. There is no tenderness. There is no rebound and no guarding.  Musculoskeletal: Normal range of motion. He exhibits no edema or tenderness.  Lymphadenopathy:    He has no cervical adenopathy.  Neurological: He is alert and oriented to person, place, and time. He has normal reflexes. He displays normal reflexes. No cranial nerve deficit. He exhibits normal muscle tone. Coordination normal.  Skin: Skin is warm. No rash noted. He is not diaphoretic. No erythema. No pallor.   Psychiatric: He has a normal mood and affect. His behavior is normal. Judgment and thought content normal.  Vitals reviewed.         Assessment & Plan:  Routine general medical examination at a health care facility - Plan: COMPLETE METABOLIC PANEL WITH GFR, CBC with Differential, Lipid panel  Patient's blood pressure slightly elevated today. I recommended that he check his blood pressure on a daily basis for the next week and report the values to me. If they're consistently greater than 140/90, I would start the patient on losartan. I will also check a CBC, CMP, and fasting lipid panel. I strongly encouraged a flu shot and a pneumonia vaccine but the patient declined.

## 2014-10-10 ENCOUNTER — Encounter: Payer: Self-pay | Admitting: *Deleted

## 2014-11-03 ENCOUNTER — Telehealth: Payer: Self-pay | Admitting: Family Medicine

## 2014-11-03 MED ORDER — LOSARTAN POTASSIUM 50 MG PO TABS: 50.0000 mg | ORAL_TABLET | Freq: Every day | ORAL | Status: DC

## 2014-11-03 NOTE — Telephone Encounter (Signed)
Pt's BP's 149/72,159/71,157/71,149/73,161/71,123/80,159/71,161/72,126/70  Per Dr. Tanya NonesPickard that is too high and pt needs to start Losartan 50mg  qd and follow up in one month  Pt aware, med sent to pharm and appt made.

## 2014-11-07 ENCOUNTER — Encounter: Payer: Self-pay | Admitting: Family Medicine

## 2014-11-07 DIAGNOSIS — Z79899 Other long term (current) drug therapy: Secondary | ICD-10-CM | POA: Insufficient documentation

## 2014-11-07 DIAGNOSIS — I10 Essential (primary) hypertension: Secondary | ICD-10-CM | POA: Insufficient documentation

## 2014-11-15 ENCOUNTER — Other Ambulatory Visit: Payer: Self-pay | Admitting: Family Medicine

## 2014-11-27 ENCOUNTER — Other Ambulatory Visit: Payer: Self-pay | Admitting: Family Medicine

## 2014-11-27 NOTE — Telephone Encounter (Signed)
Medication refilled per protocol. 

## 2014-11-29 ENCOUNTER — Other Ambulatory Visit: Payer: Self-pay | Admitting: Family Medicine

## 2014-12-05 ENCOUNTER — Other Ambulatory Visit: Payer: Self-pay | Admitting: Family Medicine

## 2014-12-06 ENCOUNTER — Other Ambulatory Visit: Payer: Self-pay | Admitting: Family Medicine

## 2014-12-18 ENCOUNTER — Encounter: Payer: Self-pay | Admitting: Family Medicine

## 2014-12-18 ENCOUNTER — Ambulatory Visit (INDEPENDENT_AMBULATORY_CARE_PROVIDER_SITE_OTHER): Payer: Commercial Managed Care - HMO | Admitting: Family Medicine

## 2014-12-18 VITALS — BP 130/68 | HR 80 | Temp 97.6°F | Resp 18 | Ht 64.0 in | Wt 160.0 lb

## 2014-12-18 DIAGNOSIS — I10 Essential (primary) hypertension: Secondary | ICD-10-CM

## 2014-12-18 MED ORDER — LOSARTAN POTASSIUM 50 MG PO TABS: 50.0000 mg | ORAL_TABLET | Freq: Every day | ORAL | Status: DC

## 2014-12-18 MED ORDER — SIMVASTATIN 40 MG PO TABS: ORAL_TABLET | ORAL | Status: DC

## 2014-12-18 NOTE — Progress Notes (Signed)
   Subjective:    Patient ID: Eric Berger, male    DOB: [birth year], 81 y.o.   MRN: [id number]  HPI Please see last office visit. Patient checked his blood pressure at home and it was consistently higher than 140 systolic. Therefore started the patient on losartan 50 mg by mouth daily. His blood pressure today is much better 130/68. He denies any chest pain shortness of breath or dyspnea on exertion. Past Medical History  Diagnosis Date  . Stroke   . Renal disorder   . Hernia   . High cholesterol   . Hearing loss   . GERD (gastroesophageal reflux disease)   . Cataracts, bilateral    Past Surgical History  Procedure Laterality Date  . Cholecystectomy    . Hernia repair    . Eye surgery     Current Outpatient Prescriptions on File Prior to Visit  Medication Sig Dispense Refill  . aspirin 325 MG tablet Take 325 mg by mouth daily.    . cholecalciferol (VITAMIN D) 1000 UNITS tablet Take 1,000 Units by mouth daily.    . Cinnamon 500 MG capsule Take 500 mg by mouth daily.    Marland Kitchen. GARLIC PO Take 1 tablet by mouth daily.    Marland Kitchen. glucosamine-chondroitin 500-400 MG tablet Take 1 tablet by mouth 3 (three) times daily.    . lansoprazole (PREVACID) 15 MG capsule Take 15 mg by mouth daily at 12 noon.    Marland Kitchen. OVER THE COUNTER MEDICATION Mixes vinegar and grape juice in small glass and drinks at night-for metabolism    . saw palmetto 500 MG capsule Take 500 mg by mouth daily.     No current facility-administered medications on file prior to visit.   No Known Allergies History   Social History  . Marital Status: Married    Spouse Name: N/A  . Number of Children: N/A  . Years of Education: N/A   Occupational History  . Not on file.   Social History Main Topics  . Smoking status: Former Smoker    Quit date: 06/08/1962  . Smokeless tobacco: Never Used  . Alcohol Use: No  . Drug Use: No  . Sexual Activity: Yes     Comment: Married.  Retired.   Other Topics Concern  . Not on file    Social History Narrative      Review of Systems  All other systems reviewed and are negative.      Objective:   Physical Exam  Cardiovascular: Normal rate, regular rhythm and normal heart sounds.   No murmur heard. Pulmonary/Chest: Effort normal and breath sounds normal. No respiratory distress. He has no wheezes. He has no rales.  Abdominal: Soft. Bowel sounds are normal. He exhibits no distension. There is no tenderness. There is no rebound and no guarding.  Musculoskeletal: He exhibits no edema.  Vitals reviewed.         Assessment & Plan:  Benign essential HTN  Blood pressure is much better today. The blood pressure is controlled. Continue losartan 50 mg by mouth daily and recheck in 6 months.

## 2015-05-11 ENCOUNTER — Other Ambulatory Visit: Payer: Self-pay | Admitting: Family Medicine

## 2015-05-11 NOTE — Telephone Encounter (Signed)
Medication refilled per protocol. 

## 2015-09-23 ENCOUNTER — Inpatient Hospital Stay (HOSPITAL_COMMUNITY)
Admission: EM | Admit: 2015-09-23 | Discharge: 2015-10-03 | DRG: 871 | Disposition: A | Discharge status: Home / Self Care | Payer: Medicare HMO | Attending: Internal Medicine | Admitting: Internal Medicine

## 2015-09-23 ENCOUNTER — Encounter (HOSPITAL_COMMUNITY): Payer: Self-pay | Admitting: Family Medicine

## 2015-09-23 ENCOUNTER — Inpatient Hospital Stay (HOSPITAL_COMMUNITY): Payer: Medicare HMO

## 2015-09-23 ENCOUNTER — Emergency Department (HOSPITAL_COMMUNITY): Payer: Medicare HMO

## 2015-09-23 DIAGNOSIS — E7800 Pure hypercholesterolemia, unspecified: Secondary | ICD-10-CM | POA: Diagnosis present

## 2015-09-23 DIAGNOSIS — R0902 Hypoxemia: Secondary | ICD-10-CM | POA: Diagnosis not present

## 2015-09-23 DIAGNOSIS — K219 Gastro-esophageal reflux disease without esophagitis: Secondary | ICD-10-CM | POA: Diagnosis present

## 2015-09-23 DIAGNOSIS — G9340 Encephalopathy, unspecified: Secondary | ICD-10-CM | POA: Diagnosis not present

## 2015-09-23 DIAGNOSIS — R112 Nausea with vomiting, unspecified: Secondary | ICD-10-CM | POA: Diagnosis not present

## 2015-09-23 DIAGNOSIS — R197 Diarrhea, unspecified: Secondary | ICD-10-CM | POA: Diagnosis present

## 2015-09-23 DIAGNOSIS — R05 Cough: Secondary | ICD-10-CM | POA: Diagnosis not present

## 2015-09-23 DIAGNOSIS — R739 Hyperglycemia, unspecified: Secondary | ICD-10-CM | POA: Diagnosis not present

## 2015-09-23 DIAGNOSIS — H9190 Unspecified hearing loss, unspecified ear: Secondary | ICD-10-CM | POA: Diagnosis present

## 2015-09-23 DIAGNOSIS — J690 Pneumonitis due to inhalation of food and vomit: Secondary | ICD-10-CM | POA: Diagnosis present

## 2015-09-23 DIAGNOSIS — Z87891 Personal history of nicotine dependence: Secondary | ICD-10-CM | POA: Diagnosis not present

## 2015-09-23 DIAGNOSIS — J9601 Acute respiratory failure with hypoxia: Secondary | ICD-10-CM | POA: Diagnosis not present

## 2015-09-23 DIAGNOSIS — I10 Essential (primary) hypertension: Secondary | ICD-10-CM | POA: Diagnosis present

## 2015-09-23 DIAGNOSIS — Z7982 Long term (current) use of aspirin: Secondary | ICD-10-CM | POA: Diagnosis not present

## 2015-09-23 DIAGNOSIS — R1310 Dysphagia, unspecified: Secondary | ICD-10-CM | POA: Diagnosis present

## 2015-09-23 DIAGNOSIS — Z66 Do not resuscitate: Secondary | ICD-10-CM | POA: Diagnosis not present

## 2015-09-23 DIAGNOSIS — J209 Acute bronchitis, unspecified: Secondary | ICD-10-CM | POA: Diagnosis present

## 2015-09-23 DIAGNOSIS — R0602 Shortness of breath: Secondary | ICD-10-CM | POA: Diagnosis not present

## 2015-09-23 DIAGNOSIS — E860 Dehydration: Secondary | ICD-10-CM | POA: Diagnosis not present

## 2015-09-23 DIAGNOSIS — I340 Nonrheumatic mitral (valve) insufficiency: Secondary | ICD-10-CM | POA: Diagnosis not present

## 2015-09-23 DIAGNOSIS — I5033 Acute on chronic diastolic (congestive) heart failure: Secondary | ICD-10-CM | POA: Diagnosis not present

## 2015-09-23 DIAGNOSIS — T380X5A Adverse effect of glucocorticoids and synthetic analogues, initial encounter: Secondary | ICD-10-CM | POA: Diagnosis not present

## 2015-09-23 DIAGNOSIS — A419 Sepsis, unspecified organism: Principal | ICD-10-CM | POA: Diagnosis present

## 2015-09-23 DIAGNOSIS — F05 Delirium due to known physiological condition: Secondary | ICD-10-CM | POA: Diagnosis present

## 2015-09-23 DIAGNOSIS — R531 Weakness: Secondary | ICD-10-CM | POA: Diagnosis present

## 2015-09-23 DIAGNOSIS — R509 Fever, unspecified: Secondary | ICD-10-CM | POA: Diagnosis not present

## 2015-09-23 DIAGNOSIS — N289 Disorder of kidney and ureter, unspecified: Secondary | ICD-10-CM

## 2015-09-23 DIAGNOSIS — N179 Acute kidney failure, unspecified: Secondary | ICD-10-CM | POA: Diagnosis not present

## 2015-09-23 DIAGNOSIS — E785 Hyperlipidemia, unspecified: Secondary | ICD-10-CM | POA: Diagnosis present

## 2015-09-23 DIAGNOSIS — J189 Pneumonia, unspecified organism: Secondary | ICD-10-CM | POA: Diagnosis not present

## 2015-09-23 DIAGNOSIS — Z79899 Other long term (current) drug therapy: Secondary | ICD-10-CM | POA: Diagnosis not present

## 2015-09-23 DIAGNOSIS — Z8673 Personal history of transient ischemic attack (TIA), and cerebral infarction without residual deficits: Secondary | ICD-10-CM | POA: Diagnosis not present

## 2015-09-23 DIAGNOSIS — R1111 Vomiting without nausea: Secondary | ICD-10-CM | POA: Diagnosis not present

## 2015-09-23 DIAGNOSIS — R062 Wheezing: Secondary | ICD-10-CM

## 2015-09-23 DIAGNOSIS — R0609 Other forms of dyspnea: Secondary | ICD-10-CM | POA: Diagnosis not present

## 2015-09-23 HISTORY — DX: Essential (primary) hypertension: I10

## 2015-09-23 LAB — CBC WITH DIFFERENTIAL/PLATELET
BASOS ABS: 0 10*3/uL (ref 0.0–0.1)
Basophils Relative: 0 %
EOS ABS: 0 10*3/uL (ref 0.0–0.7)
EOS PCT: 0 %
HCT: 43.7 % (ref 39.0–52.0)
Hemoglobin: 15.2 g/dL (ref 13.0–17.0)
LYMPHS PCT: 9 %
Lymphs Abs: 0.9 10*3/uL (ref 0.7–4.0)
MCH: 32.3 pg (ref 26.0–34.0)
MCHC: 34.8 g/dL (ref 30.0–36.0)
MCV: 93 fL (ref 78.0–100.0)
MONO ABS: 1.1 10*3/uL — AB (ref 0.1–1.0)
Monocytes Relative: 10 %
Neutro Abs: 8.4 10*3/uL — ABNORMAL HIGH (ref 1.7–7.7)
Neutrophils Relative %: 81 %
PLATELETS: 228 10*3/uL (ref 150–400)
RBC: 4.7 MIL/uL (ref 4.22–5.81)
RDW: 12.5 % (ref 11.5–15.5)
WBC: 10.4 10*3/uL (ref 4.0–10.5)

## 2015-09-23 LAB — URINALYSIS, ROUTINE W REFLEX MICROSCOPIC
Bilirubin Urine: NEGATIVE
Glucose, UA: NEGATIVE mg/dL
Ketones, ur: 15 mg/dL — AB
LEUKOCYTES UA: NEGATIVE
Nitrite: NEGATIVE
PROTEIN: 100 mg/dL — AB
Specific Gravity, Urine: 1.023 (ref 1.005–1.030)
pH: 5 (ref 5.0–8.0)

## 2015-09-23 LAB — COMPREHENSIVE METABOLIC PANEL
ALT: 32 U/L (ref 17–63)
AST: 66 U/L — AB (ref 15–41)
Albumin: 4.1 g/dL (ref 3.5–5.0)
Alkaline Phosphatase: 50 U/L (ref 38–126)
Anion gap: 14 (ref 5–15)
BUN: 26 mg/dL — AB (ref 6–20)
CHLORIDE: 100 mmol/L — AB (ref 101–111)
CO2: 23 mmol/L (ref 22–32)
Calcium: 9.2 mg/dL (ref 8.9–10.3)
Creatinine, Ser: 1.35 mg/dL — ABNORMAL HIGH (ref 0.61–1.24)
GFR, EST AFRICAN AMERICAN: 53 mL/min — AB (ref >60)
GFR, EST NON AFRICAN AMERICAN: 46 mL/min — AB (ref >60)
Glucose, Bld: 134 mg/dL — ABNORMAL HIGH (ref 65–99)
POTASSIUM: 4.9 mmol/L (ref 3.5–5.1)
SODIUM: 137 mmol/L (ref 135–145)
Total Bilirubin: 1.2 mg/dL (ref 0.3–1.2)
Total Protein: 7.7 g/dL (ref 6.5–8.1)

## 2015-09-23 LAB — LACTIC ACID, PLASMA
LACTIC ACID, VENOUS: 1.5 mmol/L (ref 0.5–2.0)
Lactic Acid, Venous: 1.7 mmol/L (ref 0.5–2.0)

## 2015-09-23 LAB — URINE MICROSCOPIC-ADD ON: Bacteria, UA: NONE SEEN

## 2015-09-23 LAB — TROPONIN I: TROPONIN I: 0.03 ng/mL (ref <0.031)

## 2015-09-23 MED ORDER — ONDANSETRON HCL 4 MG/2ML IJ SOLN
4.0000 mg | Freq: Once | INTRAMUSCULAR | Status: AC
  Administered 2015-09-23: 4 mg via INTRAVENOUS
  Filled 2015-09-23: qty 2

## 2015-09-23 MED ORDER — VITAMIN D 1000 UNITS PO TABS
1000.0000 [IU] | ORAL_TABLET | Freq: Every day | ORAL | Status: DC
  Administered 2015-09-23 – 2015-09-27 (×5): 1000 [IU] via ORAL
  Filled 2015-09-23 (×5): qty 1

## 2015-09-23 MED ORDER — ACETAMINOPHEN 650 MG RE SUPP: 650.0000 mg | Freq: Four times a day (QID) | RECTAL | Status: DC | PRN

## 2015-09-23 MED ORDER — SODIUM CHLORIDE 0.9 % IV BOLUS (SEPSIS)
2000.0000 mL | Freq: Once | INTRAVENOUS | Status: AC
  Administered 2015-09-23: 2000 mL via INTRAVENOUS

## 2015-09-23 MED ORDER — ASPIRIN 325 MG PO TABS
325.0000 mg | ORAL_TABLET | Freq: Every day | ORAL | Status: DC
  Administered 2015-09-23 – 2015-10-03 (×11): 325 mg via ORAL
  Filled 2015-09-23 (×11): qty 1

## 2015-09-23 MED ORDER — ONDANSETRON HCL 4 MG PO TABS: 4.0000 mg | ORAL_TABLET | Freq: Four times a day (QID) | ORAL | Status: DC | PRN

## 2015-09-23 MED ORDER — ACETAMINOPHEN 325 MG PO TABS
650.0000 mg | ORAL_TABLET | Freq: Four times a day (QID) | ORAL | Status: DC | PRN
  Administered 2015-09-23: 650 mg via ORAL
  Filled 2015-09-23: qty 2

## 2015-09-23 MED ORDER — ENOXAPARIN SODIUM 40 MG/0.4ML ~~LOC~~ SOLN
40.0000 mg | Freq: Every day | SUBCUTANEOUS | Status: DC
  Administered 2015-09-23 – 2015-10-02 (×9): 40 mg via SUBCUTANEOUS
  Filled 2015-09-23 (×10): qty 0.4

## 2015-09-23 MED ORDER — SODIUM CHLORIDE 0.9 % IV SOLN
INTRAVENOUS | Status: DC
  Administered 2015-09-23 – 2015-09-24 (×2): via INTRAVENOUS

## 2015-09-23 MED ORDER — LOSARTAN POTASSIUM 50 MG PO TABS
50.0000 mg | ORAL_TABLET | Freq: Every day | ORAL | Status: DC
  Administered 2015-09-23: 50 mg via ORAL
  Filled 2015-09-23: qty 1

## 2015-09-23 MED ORDER — DEXTROSE 5 % IV SOLN
1.0000 g | INTRAVENOUS | Status: DC
  Administered 2015-09-24: 1 g via INTRAVENOUS
  Filled 2015-09-23: qty 10

## 2015-09-23 MED ORDER — DEXTROSE 5 % IV SOLN
500.0000 mg | INTRAVENOUS | Status: DC
  Administered 2015-09-24 – 2015-09-27 (×4): 500 mg via INTRAVENOUS
  Filled 2015-09-23 (×5): qty 500

## 2015-09-23 MED ORDER — PANTOPRAZOLE SODIUM 40 MG PO TBEC
40.0000 mg | DELAYED_RELEASE_TABLET | Freq: Every day | ORAL | Status: DC
  Administered 2015-09-23 – 2015-10-03 (×11): 40 mg via ORAL
  Filled 2015-09-23 (×12): qty 1

## 2015-09-23 MED ORDER — ONDANSETRON HCL 4 MG/2ML IJ SOLN: 4.0000 mg | Freq: Four times a day (QID) | INTRAMUSCULAR | Status: DC | PRN

## 2015-09-23 MED ORDER — DEXTROSE 5 % IV SOLN
1.0000 g | Freq: Once | INTRAVENOUS | Status: AC
  Administered 2015-09-23: 1 g via INTRAVENOUS
  Filled 2015-09-23: qty 10

## 2015-09-23 MED ORDER — DEXTROSE 5 % IV SOLN
500.0000 mg | Freq: Once | INTRAVENOUS | Status: AC
  Administered 2015-09-23: 500 mg via INTRAVENOUS
  Filled 2015-09-23: qty 500

## 2015-09-23 MED ORDER — ACETAMINOPHEN 325 MG PO TABS
650.0000 mg | ORAL_TABLET | Freq: Four times a day (QID) | ORAL | Status: DC | PRN
  Filled 2015-09-23: qty 2

## 2015-09-23 MED ORDER — SIMVASTATIN 40 MG PO TABS
40.0000 mg | ORAL_TABLET | Freq: Every day | ORAL | Status: DC
  Administered 2015-09-24 – 2015-10-02 (×9): 40 mg via ORAL
  Filled 2015-09-23 (×9): qty 1

## 2015-09-23 NOTE — ED Provider Notes (Signed)
CSN: [id number]     Arrival date & time 09/23/15  1437 History   First MD Initiated Contact with Patient 09/23/15 1506     Chief Complaint  Patient presents with  . Altered Mental Status  . Emesis      HPI Patient presents to emergency department with productive cough and chills at home over the past week.  Over the past 2 days he's had nausea and vomiting.  No diarrhea.  No blood in his vomit.  Patient denies chest pain.  He does report some mild shortness of breath.  His family is concerned that his been slightly confused over the past several days.  Today he's been too weak to get up out of the bed.  No dysuria urinary frequency.  No back pain or abdominal pain.  No new rash noted by family   Past Medical History  Diagnosis Date  . Stroke (HCC)   . Renal disorder   . Hernia   . High cholesterol   . Hearing loss   . GERD (gastroesophageal reflux disease)   . Cataracts, bilateral   . Hypertension    Past Surgical History  Procedure Laterality Date  . Cholecystectomy    . Hernia repair    . Eye surgery     History reviewed. No pertinent family history. Social History  Substance Use Topics  . Smoking status: Former Smoker    Quit date: 06/08/1962  . Smokeless tobacco: Never Used  . Alcohol Use: No    Review of Systems  All other systems reviewed and are negative.     Allergies  Review of patient's allergies indicates no known allergies.  Home Medications   Prior to Admission medications   Medication Sig Start Date End Date Taking? Authorizing Provider  aspirin 325 MG tablet Take 325 mg by mouth daily.   Yes Historical Provider, MD  cholecalciferol (VITAMIN D) 1000 UNITS tablet Take 1,000 Units by mouth daily.   Yes Historical Provider, MD  Cinnamon 500 MG capsule Take 500 mg by mouth daily.   Yes Historical Provider, MD  Dextromethorphan Polistirex (DELSYM PO) Take 10 mLs by mouth 2 (two) times daily.   Yes Historical Provider, MD  GARLIC PO Take 1 tablet by  mouth daily.   Yes Historical Provider, MD  glucosamine-chondroitin 500-400 MG tablet Take 1 tablet by mouth 3 (three) times daily.   Yes Historical Provider, MD  lansoprazole (PREVACID) 15 MG capsule Take 15 mg by mouth daily at 12 noon.   Yes Historical Provider, MD  losartan (COZAAR) 50 MG tablet Take 1 tablet (50 mg total) by mouth daily. 12/18/14  Yes Donita BrooksWarren T Pickard, MD  OVER THE COUNTER MEDICATION Mixes vinegar and grape juice in small glass and drinks at night-for metabolism   Yes Historical Provider, MD  saw palmetto 500 MG capsule Take 500 mg by mouth daily.   Yes Historical Provider, MD  simvastatin (ZOCOR) 40 MG tablet TAKE ONE TABLET BY MOUTH ONCE DAILY AT 6PM 05/11/15  Yes Donita BrooksWarren T Pickard, MD   BP 138/57 mmHg  Pulse 94  Temp(Src) 99.2 F (37.3 C) (Oral)  Resp 39  SpO2 91% Physical Exam  Constitutional: He is oriented to person, place, and time. He appears well-developed and well-nourished.  HENT:  Head: Normocephalic and atraumatic.  Eyes: EOM are normal.  Neck: Normal range of motion.  Cardiovascular: Normal rate, regular rhythm, normal heart sounds and intact distal pulses.   Pulmonary/Chest: Effort normal. No respiratory distress.  Rhonchi bilaterally.  Decreased breath sounds at the bases  Abdominal: Soft. He exhibits no distension. There is no tenderness.  Musculoskeletal: Normal range of motion.  Neurological: He is alert and oriented to person, place, and time.  Skin: Skin is warm and dry.  Psychiatric: He has a normal mood and affect. Judgment normal.  Nursing note and vitals reviewed.   ED Course  Procedures (including critical care time) Labs Review Labs Reviewed  CBC WITH DIFFERENTIAL/PLATELET - Abnormal; Notable for the following:    Neutro Abs 8.4 (*)    Monocytes Absolute 1.1 (*)    All other components within normal limits  COMPREHENSIVE METABOLIC PANEL - Abnormal; Notable for the following:    Chloride 100 (*)    Glucose, Bld 134 (*)    BUN 26  (*)    Creatinine, Ser 1.35 (*)    AST 66 (*)    GFR calc non Af Amer 46 (*)    GFR calc Af Amer 53 (*)    All other components within normal limits  URINALYSIS, ROUTINE W REFLEX MICROSCOPIC (NOT AT [facility]) - Abnormal; Notable for the following:    Color, Urine AMBER (*)    Hgb urine dipstick MODERATE (*)    Ketones, ur 15 (*)    Protein, ur 100 (*)    All other components within normal limits  URINE MICROSCOPIC-ADD ON - Abnormal; Notable for the following:    Squamous Epithelial / LPF 0-5 (*)    All other components within normal limits  CULTURE, BLOOD (ROUTINE X 2)  CULTURE, BLOOD (ROUTINE X 2)  URINE CULTURE  TROPONIN I  LACTIC ACID, PLASMA  LACTIC ACID, PLASMA   BUN  Date Value Ref Range Status  09/23/2015 26* 6 - 20 mg/dL Final  16/06/9603 17 6 - 23 mg/dL Final  54/05/8118 19 6 - 23 mg/dL Final  14/78/2956 15 6 - 23 mg/dL Final   CREAT  Date Value Ref Range Status  10/06/2014 0.97 0.50 - 1.35 mg/dL Final  21/30/8657 8.46 0.50 - 1.35 mg/dL Final   CREATININE, SER  Date Value Ref Range Status  09/23/2015 1.35* 0.61 - 1.24 mg/dL Final  96/29/5284 1.32 0.50 - 1.35 mg/dL Final  44/09/270 1.0 0.4 - 1.5 mg/dL Final  53/66/4403 4.74  Final       Imaging Review Dg Chest 2 View  09/23/2015  CLINICAL DATA:  Patient with cough, fever and weakness for 3 days. EXAM: CHEST  2 VIEW COMPARISON:  Chest radiograph 07/18/2009 FINDINGS: Patient is rotated. Stable enlarged cardiac and mediastinal contours. Dependent heterogeneous opacities within the lung bases bilaterally. No pleural effusion or pneumothorax. Thoracic spine degenerative changes. IMPRESSION: Heterogeneous opacities within the lung bases bilaterally favored to represent atelectasis. Infection not excluded. Electronically Signed   By: Annia Belt M.D.   On: 09/23/2015 16:25   I have personally reviewed and evaluated these images and lab results as part of my medical decision-making.   EKG Interpretation   Date/Time:   Sunday September 23 2015 14:49:12 EST Ventricular Rate:  93 PR Interval:  154 QRS Duration: 98 QT Interval:  342 QTC Calculation: 425 R Axis:   -10 Text Interpretation:  Sinus rhythm Low voltage, extremity leads  nonspecific st changes compared to prior ecg Confirmed by Danijela Vessey  MD,  Caryn Bee ([id number]) on 09/23/2015 6:07:57 PM      MDM   Final diagnoses:  None    Nausea vomiting.  Inability keep fluids down.  Patient with cough and fever.  Chest x-ray with  what looks like developing between acquired pneumonia.  Patient was given Rocephin and azithromycin.  Tachypnea noted.  Oxygen level 91% on arrival.  Patient be admitted the hospital.  IV hydration.  Zofran.  Antibiotics.  Worsening renal insufficiency from baseline likely volume related    Azalia Bilis, MD 09/23/15 [phone]

## 2015-09-23 NOTE — H&P (Signed)
[facility] History and Physical  Eric Berger ZOX:[id number] DOB: Feb 13, 1926 DOA: 09/23/2015  Referring physician: Beatris Shipr.Campos. PCP: Leo GrosserPICKARD,WARREN TOM, MD  Specialists: None.  Chief Complaint:  Nausea vomiting.  History obtained from patient's wife.  HPI: Eric IvanWilliam H January is a 82 y.o. male with history of hypertension, stroke, hyperlipidemia was brought to the ER after patient had sudden onset of multiple episodes of nausea vomiting. Patient's wife states that prior to that patient has been having productive cough for last for 5 days. In the ER patient was found to be febrile. Chest x-ray shows possible infiltrates concerning for pneumonia. Patient also appears confused but follows commands. Abdomen appears benign. Blood cultures were obtained along with urine cultures and admitted for fever most likely from pneumonia. Patient's wife stated that he has become confused after having the fever. Has not had any recent sick contacts or recent travel.  Review of Systems: As presented in the history of presenting illness, rest negative.  Past Medical History  Diagnosis Date  . Stroke (HCC)   . Renal disorder   . Hernia   . High cholesterol   . Hearing loss   . GERD (gastroesophageal reflux disease)   . Cataracts, bilateral   . Hypertension    Past Surgical History  Procedure Laterality Date  . Cholecystectomy    . Hernia repair    . Eye surgery     Social History:  reports that he quit smoking about 53 years ago. He has never used smokeless tobacco. He reports that he does not drink alcohol or use illicit drugs. Where does patient live home. Can patient participate in ADLs? Yes.  No Known Allergies  Family History:  Family History  Problem Relation Age of Onset  . Diabetes Mellitus II Brother       Prior to Admission medications   Medication Sig Start Date End Date Taking? Authorizing Provider  aspirin 325 MG tablet Take 325 mg by mouth daily.   Yes Historical  Provider, MD  cholecalciferol (VITAMIN D) 1000 UNITS tablet Take 1,000 Units by mouth daily.   Yes Historical Provider, MD  Cinnamon 500 MG capsule Take 500 mg by mouth daily.   Yes Historical Provider, MD  Dextromethorphan Polistirex (DELSYM PO) Take 10 mLs by mouth 2 (two) times daily.   Yes Historical Provider, MD  GARLIC PO Take 1 tablet by mouth daily.   Yes Historical Provider, MD  glucosamine-chondroitin 500-400 MG tablet Take 1 tablet by mouth 3 (three) times daily.   Yes Historical Provider, MD  lansoprazole (PREVACID) 15 MG capsule Take 15 mg by mouth daily at 12 noon.   Yes Historical Provider, MD  losartan (COZAAR) 50 MG tablet Take 1 tablet (50 mg total) by mouth daily. 12/18/14  Yes Donita BrooksWarren T Pickard, MD  OVER THE COUNTER MEDICATION Mixes vinegar and grape juice in small glass and drinks at night-for metabolism   Yes Historical Provider, MD  saw palmetto 500 MG capsule Take 500 mg by mouth daily.   Yes Historical Provider, MD  simvastatin (ZOCOR) 40 MG tablet TAKE ONE TABLET BY MOUTH ONCE DAILY AT 6PM 05/11/15  Yes Donita BrooksWarren T Pickard, MD    Physical Exam: Filed Vitals:   09/23/15 1830 09/23/15 1900 09/23/15 2034 09/23/15 2049  BP: 127/63 129/90 114/46   Pulse: 93 96 86   Temp:   100.9 F (38.3 C) 103.7 F (39.8 C)  TempSrc:   Oral Rectal  Resp: 26 25 24    Weight:   73.5 kg (  162 lb 0.6 oz)   SpO2: 93% 91% 91% 93%     General: Moderately built and nourished.  Eyes: Anicteric no pallor.  ENT: No discharge from the ears eyes nose and mouth.  Neck: No mass felt. No neck rigidity.  Cardiovascular: S1 and S2 heard.  Respiratory: No rhonchi or crepitations.  Abdomen: Soft nontender bowel sounds present. No guarding or rigidity.  Skin: No rash.  Musculoskeletal: No edema.  Psychiatric: Appears confused.  Neurologic: Appears confused but moves all extremities.  Labs on Admission:  Basic Metabolic Panel:  Recent Labs Lab 09/23/15 1555  NA 137  K 4.9  CL 100*   CO2 23  GLUCOSE 134*  BUN 26*  CREATININE 1.35*  CALCIUM 9.2   Liver Function Tests:  Recent Labs Lab 09/23/15 1555  AST 66*  ALT 32  ALKPHOS 50  BILITOT 1.2  PROT 7.7  ALBUMIN 4.1   No results for input(s): LIPASE, AMYLASE in the last 168 hours. No results for input(s): AMMONIA in the last 168 hours. CBC:  Recent Labs Lab 09/23/15 1555  WBC 10.4  NEUTROABS 8.4*  HGB 15.2  HCT 43.7  MCV 93.0  PLT 228   Cardiac Enzymes:  Recent Labs Lab 09/23/15 1555  TROPONINI 0.03    BNP (last 3 results) No results for input(s): BNP in the last 8760 hours.  ProBNP (last 3 results) No results for input(s): PROBNP in the last 8760 hours.  CBG: No results for input(s): GLUCAP in the last 168 hours.  Radiological Exams on Admission: Dg Chest 2 View  09/23/2015  CLINICAL DATA:  Patient with cough, fever and weakness for 3 days. EXAM: CHEST  2 VIEW COMPARISON:  Chest radiograph 07/18/2009 FINDINGS: Patient is rotated. Stable enlarged cardiac and mediastinal contours. Dependent heterogeneous opacities within the lung bases bilaterally. No pleural effusion or pneumothorax. Thoracic spine degenerative changes. IMPRESSION: Heterogeneous opacities within the lung bases bilaterally favored to represent atelectasis. Infection not excluded. Electronically Signed   By: Annia Belt M.D.   On: 09/23/2015 16:25     Assessment/Plan Principal Problem:   Pneumonia Active Problems:   HTN (hypertension)   CAP (community acquired pneumonia)   Acute encephalopathy   ARF (acute renal failure) (HCC)   Nausea with vomiting   1. Fever most likely from pneumonia - at this time patient been placed on them began by ceftriaxone and Zithromax. Check urine strep antigen and Legionella antigen and influenza PCR. Follow blood cultures and urine cultures. Continue with hydration. 2. Acute encephalopathy from fever - see #1. 3. Nausea vomiting - abdomen B is benign. Since patient is having fever we  will check CT abdomen and pelvis. 4. Acute renal failure - probably from nausea vomiting. Hold Cozaar and hydrated. 5. Hypertension - when necessary IV hydralazine since we are holding off Cozaar closely follow blood pressure trends. 6. History of stroke.   DVT Prophylaxis Lovenox.  Code Status: DO NOT RESUSCITATE.  Family Communication: Discussed with patient's wife.  Disposition Plan: Admit to inpatient.    Jamina Macbeth N. [facility] Pager [phone].  If 7PM-7AM, please contact night-coverage [email or web address] Password [facility] 09/23/2015, 9:47 PM

## 2015-09-23 NOTE — ED Notes (Signed)
Pt requesting update, unaware of admission status. Dr.Campos aware and to bedside

## 2015-09-23 NOTE — ED Notes (Signed)
Pt here for altered mental status, vomiting per family. Pt lethargic. Pt has cough x 1 week. Pt A&Ox3. 20 in lac. PT HAS NO COMPLAINTS. BP 166/68 CBG 189. 12 lead unremarkable.

## 2015-09-24 ENCOUNTER — Inpatient Hospital Stay (HOSPITAL_COMMUNITY): Payer: Medicare HMO

## 2015-09-24 LAB — COMPREHENSIVE METABOLIC PANEL
ALBUMIN: 3.1 g/dL — AB (ref 3.5–5.0)
ALT: 34 U/L (ref 17–63)
ANION GAP: 10 (ref 5–15)
AST: 65 U/L — ABNORMAL HIGH (ref 15–41)
Alkaline Phosphatase: 39 U/L (ref 38–126)
BILIRUBIN TOTAL: 0.8 mg/dL (ref 0.3–1.2)
BUN: 30 mg/dL — ABNORMAL HIGH (ref 6–20)
CO2: 24 mmol/L (ref 22–32)
Calcium: 8.1 mg/dL — ABNORMAL LOW (ref 8.9–10.3)
Chloride: 107 mmol/L (ref 101–111)
Creatinine, Ser: 1.5 mg/dL — ABNORMAL HIGH (ref 0.61–1.24)
GFR calc non Af Amer: 40 mL/min — ABNORMAL LOW (ref >60)
GFR, EST AFRICAN AMERICAN: 46 mL/min — AB (ref >60)
GLUCOSE: 109 mg/dL — AB (ref 65–99)
POTASSIUM: 4.6 mmol/L (ref 3.5–5.1)
SODIUM: 141 mmol/L (ref 135–145)
TOTAL PROTEIN: 6.3 g/dL — AB (ref 6.5–8.1)

## 2015-09-24 LAB — CBC WITH DIFFERENTIAL/PLATELET
BASOS ABS: 0 10*3/uL (ref 0.0–0.1)
BASOS PCT: 0 %
Eosinophils Absolute: 0 10*3/uL (ref 0.0–0.7)
Eosinophils Relative: 0 %
HEMATOCRIT: 39.1 % (ref 39.0–52.0)
HEMOGLOBIN: 13.2 g/dL (ref 13.0–17.0)
LYMPHS PCT: 16 %
Lymphs Abs: 1.6 10*3/uL (ref 0.7–4.0)
MCH: 31.7 pg (ref 26.0–34.0)
MCHC: 33.8 g/dL (ref 30.0–36.0)
MCV: 93.8 fL (ref 78.0–100.0)
MONO ABS: 1.7 10*3/uL — AB (ref 0.1–1.0)
Monocytes Relative: 17 %
NEUTROS ABS: 6.7 10*3/uL (ref 1.7–7.7)
NEUTROS PCT: 67 %
Platelets: 193 10*3/uL (ref 150–400)
RBC: 4.17 MIL/uL — AB (ref 4.22–5.81)
RDW: 12.9 % (ref 11.5–15.5)
WBC: 9.9 10*3/uL (ref 4.0–10.5)

## 2015-09-24 LAB — CK: Total CK: 1516 U/L — ABNORMAL HIGH (ref 49–397)

## 2015-09-24 MED ORDER — HYDRALAZINE HCL 20 MG/ML IJ SOLN: 10.0000 mg | INTRAMUSCULAR | Status: DC | PRN

## 2015-09-24 MED ORDER — IOHEXOL 300 MG/ML  SOLN
25.0000 mL | INTRAMUSCULAR | Status: AC
  Administered 2015-09-24 (×2): 25 mL via ORAL

## 2015-09-24 MED ORDER — SODIUM CHLORIDE 0.9 % IV SOLN
INTRAVENOUS | Status: DC
  Administered 2015-09-24 – 2015-09-25 (×2): via INTRAVENOUS

## 2015-09-24 MED ORDER — SODIUM CHLORIDE 0.9 % IV SOLN
3.0000 g | Freq: Three times a day (TID) | INTRAVENOUS | Status: DC
  Administered 2015-09-24 – 2015-09-28 (×11): 3 g via INTRAVENOUS
  Filled 2015-09-24 (×14): qty 3

## 2015-09-24 MED ORDER — VANCOMYCIN HCL IN DEXTROSE 1-5 GM/200ML-% IV SOLN
1000.0000 mg | INTRAVENOUS | Status: DC
  Administered 2015-09-24 – 2015-09-25 (×2): 1000 mg via INTRAVENOUS
  Filled 2015-09-24 (×3): qty 200

## 2015-09-24 NOTE — Progress Notes (Addendum)
[facility] PROGRESS NOTE  Eric IvanWilliam H Mazurowski JYN:[id number] DOB: [birth year] DOA: 09/23/2015 PCP: Leo GrosserPICKARD,WARREN TOM, MD  Brief Summary  Eric Berger is a 82 y.o. male with history of hypertension, stroke, hyperlipidemia was brought to the ER after patient had sudden onset of multiple episodes of nausea vomiting. Patient's wife states that prior to that patient has been having productive cough for last for 5 days. In the ER patient was found to be febrile. Chest x-ray shows possible infiltrates concerning for pneumonia. Patient also appears confused but follows commands. Abdomen appears benign. Blood cultures were obtained along with urine cultures and admitted for fever most likely from pneumonia. Patient's wife stated that he has become confused after having the fever. Has not had any recent sick contacts or recent travel.  Assessment/Plan  Sepsis (fever, tachypnea) secondary to CAP vs. Aspiration pneumonia since hx of stroke and vomiting -  Continue azithromycin -  Change ceftriaxone to unasyn -  F/u blood cultures (see below) -  Legionella and S. pneumo ag -  Flu PCR neg -  SLP evaluation  Positive blood culture -  Start vancomycin -  Repeat blood culture -  F/u speciation  Acute encephalopathy, likely secondary to infection -  Still confused -  As above  Nausea and vomiting, may be secondary to pneumonia -  CT without evidence of underlying intraabdominal process to explain these symptoms  AKI, creatinine trended up to 1.5  -  Continue to minimize nephrotoxins -  Avoid IV contrast -  IVF   Hypertension, currently hypotensive -  Hold losartan  Hx of stroke, stable -  Continue aspirin and simvastatin  Diet:  Healthy heart Access:  PIV IVF:  yes Proph:  lovenox  Code Status: DNR Family Communication: patient alone Disposition Plan: pending improvement in sepsis, afebrile, breathing and mentation improved.  F/u blood culture, creatinine trending down,  blood pressure stable   Consultants:  none  Procedures:  CT abd/pelvis  Antibiotics:  Ceftriaxone 1/1  Azithromycin 1/1  Vancomycin 1/2    HPI/Subjective:  Confused, no recent vomiting.  Denies pain, SOB.    Objective: Filed Vitals:   09/23/15 2049 09/24/15 0400 09/24/15 0511 09/24/15 1033  BP:   111/43 91/51  Pulse:   74 80  Temp: 103.7 F (39.8 C) 98.8 F (37.1 C) 98.1 F (36.7 C) 98.5 F (36.9 C)  TempSrc: Rectal Oral Oral Oral  Resp:   20 18  Weight:      SpO2: 93%  95% 94%    Intake/Output Summary (Last 24 hours) at 09/24/15 1801 Last data filed at 09/24/15 1500  Gross per 24 hour  Intake   2000 ml  Output    720 ml  Net   1280 ml   Filed Weights   09/23/15 2034  Weight: 73.5 kg (162 lb 0.6 oz)   Body mass index is 27.8 kg/(m^2).  Exam:   General:  adult male, No acute distress  HEENT:  NCAT, MMM  Cardiovascular:  RRR, nl S1, S2 no mrg, 2+ pulses, warm extremities  Respiratory:  rhonchorous bilateral breath sounds, rales at bilateral bases, no wheezes, no increased WOB  Abdomen:   NABS, soft, NT/ND  MSK:   Normal tone and bulk, no LEE  Data Reviewed: Basic Metabolic Panel:  Recent Labs Lab 09/23/15 1555 09/24/15 0458  NA 137 141  K 4.9 4.6  CL 100* 107  CO2 23 24  GLUCOSE 134* 109*  BUN 26* 30*  CREATININE 1.35* 1.50*  CALCIUM  9.2 8.1*   Liver Function Tests:  Recent Labs Lab 09/23/15 1555 09/24/15 0458  AST 66* 65*  ALT 32 34  ALKPHOS 50 39  BILITOT 1.2 0.8  PROT 7.7 6.3*  ALBUMIN 4.1 3.1*   No results for input(s): LIPASE, AMYLASE in the last 168 hours. No results for input(s): AMMONIA in the last 168 hours. CBC:  Recent Labs Lab 09/23/15 1555 09/24/15 0458  WBC 10.4 9.9  NEUTROABS 8.4* 6.7  HGB 15.2 13.2  HCT 43.7 39.1  MCV 93.0 93.8  PLT 228 193    Recent Results (from the past 240 hour(s))  Urine culture     Status: None (Preliminary result)   Collection Time: 09/23/15  3:50 PM  Result Value  Ref Range Status   Specimen Description URINE, RANDOM  Final   Special Requests NONE  Final   Culture TOO YOUNG TO READ  Final   Report Status PENDING  Incomplete  Blood culture (routine x 2)     Status: None (Preliminary result)   Collection Time: 09/23/15  3:55 PM  Result Value Ref Range Status   Specimen Description BLOOD RIGHT ANTECUBITAL  Final   Special Requests BOTTLES DRAWN AEROBIC AND ANAEROBIC 5CC  Final   Culture  Setup Time   Final    GRAM POSITIVE COCCI IN CLUSTERS AEROBIC BOTTLE ONLY CRITICAL RESULT CALLED TO, READ BACK BY AND VERIFIED WITH: J ADAMS,RN AT 1237 09/24/15 BY L BENFIELD    Culture GRAM POSITIVE COCCI  Final   Report Status PENDING  Incomplete  Blood culture (routine x 2)     Status: None (Preliminary result)   Collection Time: 09/23/15  4:00 PM  Result Value Ref Range Status   Specimen Description BLOOD RIGHT HAND  Final   Special Requests IN PEDIATRIC BOTTLE 3CC  Final   Culture NO GROWTH < 24 HOURS  Final   Report Status PENDING  Incomplete     Studies: Ct Abdomen Pelvis Wo Contrast  09/24/2015  CLINICAL DATA:  Fever nausea vomiting since yesterday. EXAM: CT ABDOMEN AND PELVIS WITHOUT CONTRAST TECHNIQUE: Multidetector CT imaging of the abdomen and pelvis was performed following the standard protocol without IV contrast. COMPARISON:  06/08/2012 FINDINGS: Lower chest: 4 mm posterior right middle lobe pulmonary nodule noted. Right lower lobe collapse/consolidation with mild atelectasis or infiltrate in the left base. Hepatobiliary: No focal abnormality in the liver on this study without intravenous contrast. No evidence of hepatomegaly. Small rim calcified structures along the posterior liver capsular stable. Patient is status post cholecystectomy. No intrahepatic or extrahepatic biliary dilation. Pancreas: No focal mass lesion. No dilatation of the main duct. No intraparenchymal cyst. No peripancreatic edema. Spleen: No splenomegaly. No focal mass lesion.  Adrenals/Urinary Tract: No adrenal nodule or mass. Right kidney is unremarkable. There is evidence of central sinus cysts in the left kidney, as seen previously. Fine calcification along the wall of 1 of the cysts is also stable. No evidence for hydroureter. The urinary bladder appears normal for the degree of distention. Stomach/Bowel: Stomach is nondistended. No gastric wall thickening. No evidence of outlet obstruction. Duodenum is normally positioned as is the ligament of Treitz. No small bowel wall thickening. No small bowel dilatation. The terminal ileum is normal. The appendix is normal. No gross colonic mass. No colonic wall thickening. No substantial diverticular change. Vascular/Lymphatic: There is abdominal aortic atherosclerosis without aneurysm. There is no gastrohepatic or hepatoduodenal ligament lymphadenopathy. No intraperitoneal or retroperitoneal lymphadenopy. No pelvic sidewall lymphadenopathy. Reproductive: Prostate gland appears  enlarged. Other: No intraperitoneal free fluid. Musculoskeletal: Gas loculation the subcutaneous fat of the lower left anterior abdominal wall presumably represent injection site. Left inguinal hernia contains prominent venous anatomy and varicocele could have this appearance. Bone windows reveal no worrisome lytic or sclerotic osseous lesions. IMPRESSION: 1. No CT findings to explain the patient's history of nausea and vomiting with fever. 2. Bilateral lower lobe collapse/consolidation suspicious for pneumonia. 3. 4 mm right middle lobe pulmonary nodule. If the patient is at high risk for bronchogenic carcinoma, follow-up chest CT at 1 year is recommended. If the patient is at low risk, no follow-up is needed. This recommendation follows the consensus statement: Guidelines for Management of Small Pulmonary Nodules Detected on CT Scans: A Statement from the Fleischner Society as published in Radiology 2005; 237:395-400. 4. Central sinus cysts in the left kidney. 5.  Prostatomegaly. 6. Abdominal aortic atherosclerosis. Electronically Signed   By: Kennith Center M.D.   On: 09/24/2015 11:50   Dg Chest 2 View  09/23/2015  CLINICAL DATA:  Patient with cough, fever and weakness for 3 days. EXAM: CHEST  2 VIEW COMPARISON:  Chest radiograph 07/18/2009 FINDINGS: Patient is rotated. Stable enlarged cardiac and mediastinal contours. Dependent heterogeneous opacities within the lung bases bilaterally. No pleural effusion or pneumothorax. Thoracic spine degenerative changes. IMPRESSION: Heterogeneous opacities within the lung bases bilaterally favored to represent atelectasis. Infection not excluded. Electronically Signed   By: Annia Belt M.D.   On: 09/23/2015 16:25   Ct Head Wo Contrast  09/24/2015  CLINICAL DATA:  Acute onset of productive cough and chills. Nausea and vomiting. Mild shortness of breath. Initial encounter. EXAM: CT HEAD WITHOUT CONTRAST TECHNIQUE: Contiguous axial images were obtained from the base of the skull through the vertex without intravenous contrast. COMPARISON:  CT of the head performed 07/18/2009, and MRI/MRA of the head performed 07/19/2009 FINDINGS: There is no evidence of acute infarction, mass lesion, or intra- or extra-axial hemorrhage on CT. Prominence of ventricles and sulci reflects mild cortical volume loss. Scattered periventricular and subcortical white matter change likely reflects small vessel ischemic microangiopathy. Chronic lacunar infarcts are noted at the basal ganglia bilaterally. The brainstem and fourth ventricle are within normal limits. The cerebral hemispheres demonstrate grossly normal gray-white differentiation. No mass effect or midline shift is seen. There is no evidence of fracture; visualized osseous structures are unremarkable in appearance. The visualized portions of the orbits are within normal limits. The paranasal sinuses and mastoid air cells are well-aerated. No significant soft tissue abnormalities are seen. IMPRESSION:  1. No acute intracranial pathology seen on CT. 2. Mild cortical volume loss and scattered small vessel microangiopathy. 3. Chronic lacunar infarcts at the basal ganglia bilaterally. Electronically Signed   By: Roanna Raider M.D.   On: 09/24/2015 00:10    Scheduled Meds: . aspirin  325 mg Oral Daily  . azithromycin  500 mg Intravenous Q24H  . cefTRIAXone (ROCEPHIN)  IV  1 g Intravenous Q24H  . cholecalciferol  1,000 Units Oral Daily  . enoxaparin (LOVENOX) injection  40 mg Subcutaneous QHS  . pantoprazole  40 mg Oral Daily  . simvastatin  40 mg Oral q1800  . vancomycin  1,000 mg Intravenous Q24H   Continuous Infusions: . sodium chloride 100 mL/hr at 09/24/15 1555    Principal Problem:   Pneumonia Active Problems:   HTN (hypertension)   CAP (community acquired pneumonia)   Acute encephalopathy   ARF (acute renal failure) (HCC)   Nausea with vomiting    Time  spent: 30 min    Ameshia Pewitt, [facility]  [facility] Pager [phone]. If 7PM-7AM, please contact night-coverage at [email or web address], password [email or web address] 09/24/2015, 6:01 PM  LOS: 1 day

## 2015-09-24 NOTE — Evaluation (Addendum)
Clinical/Bedside Swallow Evaluation Patient Details  Name: Eric Berger MRN: [id number] Date of Birth: [birth year]  Today's Date: 09/24/2015 Time: SLP Start Time (ACUTE ONLY): 1328 SLP Stop Time (ACUTE ONLY): 1340 SLP Time Calculation (min) (ACUTE ONLY): 12 min  Past Medical History:  Past Medical History  Diagnosis Date  . Stroke (HCC)   . Renal disorder   . Hernia   . High cholesterol   . Hearing loss   . GERD (gastroesophageal reflux disease)   . Cataracts, bilateral   . Hypertension    Past Surgical History:  Past Surgical History  Procedure Laterality Date  . Cholecystectomy    . Hernia repair    . Eye surgery     HPI:  82 y.o. male with history of hypertension, stroke, hyperlipidemia, GERD and hernia brought to the ER after patient had sudden onset of multiple episodes of nausea vomiting and productive cough for last for 5 days. Per MD note pt found to be febrile and chest x-ray shows possible infiltrates concerning for pneumonia.CT abdomen " not findings to explain nausea and vomiting". Pt reports using thick liquids for "awhile after stroke and I hated it" .   Assessment / Plan / Recommendation Clinical Impression  Immediate throat clearing following straw sips thin water not present with cup sips. Adequate mastication and transit with solid. Suspect possible intermittent laryngeal penetration. Delayed oral transit (mild) with liquids (? volitional). Mild eructation noting possible esophageal involvement; has hx of GERD. Recommend continue regular texture and thin liquids, no straws, pills whole in applesauce, small sips and ST follow up.     Aspiration Risk  Moderate aspiration risk    Diet Recommendation Regular;Thin liquid   Liquid Administration via: Cup;No straw Medication Administration: Whole meds with liquid Supervision: Patient able to self feed;Intermittent supervision to cue for compensatory strategies Compensations: Slow rate;Small  sips/bites Postural Changes: Seated upright at 90 degrees;Remain upright for at least 30 minutes after po intake    Other  Recommendations Oral Care Recommendations: Oral care QID   Follow up Recommendations   (TBD)    Frequency and Duration min 2x/week  2 weeks       Prognosis Prognosis for Safe Diet Advancement: Good      Swallow Study   General HPI: 82 y.o. male with history of hypertension, stroke, hyperlipidemia, GERD and hernia brought to the ER after patient had sudden onset of multiple episodes of nausea vomiting and productive cough for last for 5 days. Per MD note pt found to be febrile and chest x-ray shows possible infiltrates concerning for pneumonia.CT abdomen " not findings to explain nausea and vomiting". Pt reports using thick liquids for "awhile after stroke and I hated it" . Type of Study: Bedside Swallow Evaluation Previous Swallow Assessment:  (none found) Diet Prior to this Study: Regular;Thin liquids Temperature Spikes Noted: No Respiratory Status: Nasal cannula History of Recent Intubation: No Behavior/Cognition: Alert;Cooperative;Pleasant mood;Requires cueing Oral Cavity Assessment: Within Functional Limits Oral Care Completed by SLP: Yes Oral Cavity - Dentition: Dentures, top;Dentures, bottom (upper dentures unsanitary due to previous adhesive ) Vision: Functional for self-feeding Self-Feeding Abilities: Able to feed self Patient Positioning: Upright in bed Baseline Vocal Quality: Normal Volitional Cough: Strong Volitional Swallow: Able to elicit    Oral/Motor/Sensory Function Overall Oral Motor/Sensory Function: Within functional limits   Ice Chips Ice chips: Not tested   Thin Liquid Thin Liquid: Impaired Presentation: Cup;Straw Oral Phase Functional Implications: Oral holding (hesitation vs holding) Pharyngeal  Phase Impairments: Throat Clearing -  Immediate (after straw sips)    Nectar Thick Nectar Thick Liquid: Not tested   Honey Thick Honey  Thick Liquid: Not tested   Puree Puree: Within functional limits   Solid      Solid: Within functional limits       Eric Berger 09/24/2015,3:18 PM  Eric Berger.Ed [employer] [phone]

## 2015-09-24 NOTE — Progress Notes (Signed)
Bladder scan showed >500 cc urine. Will place foley as per standing care order.

## 2015-09-24 NOTE — Progress Notes (Signed)
ANTIBIOTIC CONSULT NOTE - INITIAL  Pharmacy Consult for Vancomycin Indication: GPC in BC x 1  No Known Allergies  Patient Measurements: Weight: 162 lb 0.6 oz (73.5 kg) Adjusted Body Weight:    Vital Signs: Temp: 98.5 F (36.9 C) (01/02 1033) Temp Source: Oral (01/02 1033) BP: 91/51 mmHg (01/02 1033) Pulse Rate: 80 (01/02 1033) Intake/Output from previous day: 01/01 0701 - 01/02 0700 In: 2000 [I.V.:2000] Out: 450 [Urine:450] Intake/Output from this shift: Total I/O In: -  Out: 20 [Urine:20]  Labs:  Recent Labs  09/23/15 1555 09/24/15 0458  WBC 10.4 9.9  HGB 15.2 13.2  PLT 228 193  CREATININE 1.35* 1.50*   CrCl cannot be calculated (Unknown ideal weight.). No results for input(s): VANCOTROUGH, VANCOPEAK, VANCORANDOM, GENTTROUGH, GENTPEAK, GENTRANDOM, TOBRATROUGH, TOBRAPEAK, TOBRARND, AMIKACINPEAK, AMIKACINTROU, AMIKACIN in the last 72 hours.   Microbiology: Recent Results (from the past 720 hour(s))  Urine culture     Status: None (Preliminary result)   Collection Time: 09/23/15  3:50 PM  Result Value Ref Range Status   Specimen Description URINE, RANDOM  Final   Special Requests NONE  Final   Culture NO GROWTH < 24 HOURS  Final   Report Status PENDING  Incomplete  Blood culture (routine x 2)     Status: None (Preliminary result)   Collection Time: 09/23/15  3:55 PM  Result Value Ref Range Status   Specimen Description BLOOD RIGHT ANTECUBITAL  Final   Special Requests BOTTLES DRAWN AEROBIC AND ANAEROBIC 5CC  Final   Culture  Setup Time   Final    GRAM POSITIVE COCCI IN CLUSTERS AEROBIC BOTTLE ONLY CRITICAL RESULT CALLED TO, READ BACK BY AND VERIFIED WITH: J ADAMS,RN AT 1237 09/24/15 BY L BENFIELD    Culture GRAM POSITIVE COCCI  Final   Report Status PENDING  Incomplete  Blood culture (routine x 2)     Status: None (Preliminary result)   Collection Time: 09/23/15  4:00 PM  Result Value Ref Range Status   Specimen Description BLOOD RIGHT HAND  Final   Special Requests IN PEDIATRIC BOTTLE 3CC  Final   Culture NO GROWTH < 24 HOURS  Final   Report Status PENDING  Incomplete    Medical History: Past Medical History  Diagnosis Date  . Stroke (HCC)   . Renal disorder   . Hernia   . High cholesterol   . Hearing loss   . GERD (gastroesophageal reflux disease)   . Cataracts, bilateral   . Hypertension     Medications:  Prescriptions prior to admission  Medication Sig Dispense Refill Last Dose  . aspirin 325 MG tablet Take 325 mg by mouth daily.   09/22/2015 at Unknown time  . cholecalciferol (VITAMIN D) 1000 UNITS tablet Take 1,000 Units by mouth daily.   09/22/2015 at Unknown time  . Cinnamon 500 MG capsule Take 500 mg by mouth daily.   09/22/2015 at Unknown time  . Dextromethorphan Polistirex (DELSYM PO) Take 10 mLs by mouth 2 (two) times daily.   09/23/2015 at Unknown time  . GARLIC PO Take 1 tablet by mouth daily.   09/22/2015 at Unknown time  . glucosamine-chondroitin 500-400 MG tablet Take 1 tablet by mouth 3 (three) times daily.   09/22/2015 at Unknown time  . lansoprazole (PREVACID) 15 MG capsule Take 15 mg by mouth daily at 12 noon.   09/22/2015 at Unknown time  . losartan (COZAAR) 50 MG tablet Take 1 tablet (50 mg total) by mouth daily. 30 tablet 5 09/22/2015  at Unknown time  . OVER THE COUNTER MEDICATION Mixes vinegar and grape juice in small glass and drinks at night-for metabolism   09/22/2015 at Unknown time  . saw palmetto 500 MG capsule Take 500 mg by mouth daily.   09/22/2015 at Unknown time  . simvastatin (ZOCOR) 40 MG tablet TAKE ONE TABLET BY MOUTH ONCE DAILY AT 6PM 90 tablet 0 09/22/2015 at Unknown time   Assessment: 82 y/o M presents with AMS, cough/chills x 1 week, N/V last 2 days. Febrile in ER with chest x-ray shows possible infiltrates concerning for pneumonia. Initially started on Rocephin/Azithro - Temp 103.8. CK 1516. Scr 1.5, AST 65  Infectious Disease: Tmax 103.7. WBC 9.9.  Rocephin/Azithro 1/1>> Vanco  1/2>>  1/1: BC x2: GPC 1/1 UCx: pending  Goal of Therapy:  Vancomycin trough level 15-20 mcg/ml  Plan:  Vancomcyin 1g IV q24hrs Trough after 3-5 doses at steady state Continue Rocephin/Azithro for PNA.   Krysteena Stalker S. Merilynn Finlandobertson, PharmD, BCPS Clinical Staff Pharmacist Pager [phone]  Misty Stanleyobertson, Ambree Frances Stillinger 09/24/2015,2:08 PM

## 2015-09-24 NOTE — Progress Notes (Signed)
New Admission Note  Arrival: Via stretcher Mental Orientation: Alert &oriented X 2, confused. Telemetry: On tele Skin:  No skin issues verified by 2nd RN Cathie IV: Left AC Pain: No c/o of pain Safety measures:  verbalized understanding. Bed in lowest position. Yellow bracelet on. Non-skid socks on. Bed alarm on. Family: No family at bedside. Orders have been reviewed and implemented. Will continue to monitor.

## 2015-09-24 NOTE — Progress Notes (Signed)
ANTIBIOTIC CONSULT NOTE - INITIAL  Pharmacy Consult for Unasyn Indication: aspiration PNA  No Known Allergies  Patient Measurements: Weight: 162 lb 0.6 oz (73.5 kg)  Vital Signs: Temp: 98.5 F (36.9 C) (01/02 1033) Temp Source: Oral (01/02 1033) BP: 91/51 mmHg (01/02 1033) Pulse Rate: 80 (01/02 1033) Intake/Output from previous day: 01/01 0701 - 01/02 0700 In: 2000 [I.V.:2000] Out: 450 [Urine:450] Intake/Output from this shift: Total I/O In: -  Out: 370 [Urine:370]  Labs:  Recent Labs  09/23/15 1555 09/24/15 0458  WBC 10.4 9.9  HGB 15.2 13.2  PLT 228 193  CREATININE 1.35* 1.50*   CrCl cannot be calculated (Unknown ideal weight.). No results for input(s): VANCOTROUGH, VANCOPEAK, VANCORANDOM, GENTTROUGH, GENTPEAK, GENTRANDOM, TOBRATROUGH, TOBRAPEAK, TOBRARND, AMIKACINPEAK, AMIKACINTROU, AMIKACIN in the last 72 hours.   Microbiology: Recent Results (from the past 720 hour(s))  Urine culture     Status: None (Preliminary result)   Collection Time: 09/23/15  3:50 PM  Result Value Ref Range Status   Specimen Description URINE, RANDOM  Final   Special Requests NONE  Final   Culture TOO YOUNG TO READ  Final   Report Status PENDING  Incomplete  Blood culture (routine x 2)     Status: None (Preliminary result)   Collection Time: 09/23/15  3:55 PM  Result Value Ref Range Status   Specimen Description BLOOD RIGHT ANTECUBITAL  Final   Special Requests BOTTLES DRAWN AEROBIC AND ANAEROBIC 5CC  Final   Culture  Setup Time   Final    GRAM POSITIVE COCCI IN CLUSTERS AEROBIC BOTTLE ONLY CRITICAL RESULT CALLED TO, READ BACK BY AND VERIFIED WITH: J ADAMS,RN AT 1237 09/24/15 BY L BENFIELD    Culture GRAM POSITIVE COCCI  Final   Report Status PENDING  Incomplete  Blood culture (routine x 2)     Status: None (Preliminary result)   Collection Time: 09/23/15  4:00 PM  Result Value Ref Range Status   Specimen Description BLOOD RIGHT HAND  Final   Special Requests IN PEDIATRIC  BOTTLE 3CC  Final   Culture NO GROWTH < 24 HOURS  Final   Report Status PENDING  Incomplete    Medical History: Past Medical History  Diagnosis Date  . Stroke (HCC)   . Renal disorder   . Hernia   . High cholesterol   . Hearing loss   . GERD (gastroesophageal reflux disease)   . Cataracts, bilateral   . Hypertension     Assessment: 82 y/o M presents with AMS, cough/chills x 1 week, N/V last 2 days. Febrile in ER with chest x-ray shows possible infiltrates concerning for pneumonia. Initially started on Rocephin/Azithro. Now has 1/2 blood cx's positive and switching Rocephin to Unasyn for possible aspiration PNA. SCr 1.5, CrCl ~3335ml/min.  Goal of Therapy:  Resolution of infection  Plan:  Start Unasyn 3g IV Q8 Monitor clinical picture, renal function F/U C&S, abx deescalation / LOT

## 2015-09-25 ENCOUNTER — Inpatient Hospital Stay (HOSPITAL_COMMUNITY): Payer: Medicare HMO

## 2015-09-25 DIAGNOSIS — I10 Essential (primary) hypertension: Secondary | ICD-10-CM

## 2015-09-25 DIAGNOSIS — A419 Sepsis, unspecified organism: Secondary | ICD-10-CM | POA: Diagnosis present

## 2015-09-25 LAB — CULTURE, BLOOD (ROUTINE X 2)

## 2015-09-25 LAB — INFLUENZA PANEL BY PCR (TYPE A & B)
H1N1 flu by pcr: NOT DETECTED
INFLAPCR: NEGATIVE
INFLBPCR: NEGATIVE

## 2015-09-25 LAB — BASIC METABOLIC PANEL
Anion gap: 6 (ref 5–15)
BUN: 39 mg/dL — ABNORMAL HIGH (ref 6–20)
CHLORIDE: 109 mmol/L (ref 101–111)
CO2: 22 mmol/L (ref 22–32)
Calcium: 7.5 mg/dL — ABNORMAL LOW (ref 8.9–10.3)
Creatinine, Ser: 1.66 mg/dL — ABNORMAL HIGH (ref 0.61–1.24)
GFR, EST AFRICAN AMERICAN: 41 mL/min — AB (ref >60)
GFR, EST NON AFRICAN AMERICAN: 35 mL/min — AB (ref >60)
Glucose, Bld: 96 mg/dL (ref 65–99)
POTASSIUM: 3.7 mmol/L (ref 3.5–5.1)
SODIUM: 137 mmol/L (ref 135–145)

## 2015-09-25 LAB — CBC
HEMATOCRIT: 32.2 % — AB (ref 39.0–52.0)
Hemoglobin: 11 g/dL — ABNORMAL LOW (ref 13.0–17.0)
MCH: 31.7 pg (ref 26.0–34.0)
MCHC: 34.2 g/dL (ref 30.0–36.0)
MCV: 92.8 fL (ref 78.0–100.0)
PLATELETS: 170 10*3/uL (ref 150–400)
RBC: 3.47 MIL/uL — AB (ref 4.22–5.81)
RDW: 13 % (ref 11.5–15.5)
WBC: 7.8 10*3/uL (ref 4.0–10.5)

## 2015-09-25 LAB — URINE CULTURE

## 2015-09-25 LAB — STREP PNEUMONIAE URINARY ANTIGEN: STREP PNEUMO URINARY ANTIGEN: NEGATIVE

## 2015-09-25 MED ORDER — SACCHAROMYCES BOULARDII 250 MG PO CAPS
250.0000 mg | ORAL_CAPSULE | Freq: Two times a day (BID) | ORAL | Status: DC
  Administered 2015-09-25 – 2015-10-03 (×15): 250 mg via ORAL
  Filled 2015-09-25 (×16): qty 1

## 2015-09-25 MED ORDER — IPRATROPIUM-ALBUTEROL 0.5-2.5 (3) MG/3ML IN SOLN
3.0000 mL | Freq: Four times a day (QID) | RESPIRATORY_TRACT | Status: DC
  Administered 2015-09-25 – 2015-10-03 (×25): 3 mL via RESPIRATORY_TRACT
  Filled 2015-09-25 (×33): qty 3

## 2015-09-25 MED ORDER — SODIUM CHLORIDE 0.9 % IV SOLN
INTRAVENOUS | Status: DC
  Administered 2015-09-26: via INTRAVENOUS

## 2015-09-25 NOTE — Progress Notes (Signed)
[facility] PROGRESS NOTE  Eric Berger:[id number] DOB: 1926-04-26 DOA: 09/23/2015 PCP: Leo Grosser, MD  Brief Summary  Eric Berger is a 82 y.o. male with history of hypertension, stroke, hyperlipidemia was brought to the ER after patient had sudden onset of multiple episodes of nausea vomiting. Patient's wife states that prior to that patient has been having productive cough for last for 5 days. In the ER patient was found to be febrile. Chest x-ray shows possible infiltrates concerning for pneumonia. Patient also appears confused but follows commands. Abdomen appears benign. Blood cultures were obtained along with urine cultures and admitted for fever most likely from pneumonia. Patient's wife stated that he has become confused after having the fever. Has not had any recent sick contacts or recent travel.  Assessment/Plan  Sepsis (fever, tachypnea) and acute hypoxic respiratory failure present at time of admission secondary to CAP vs. Aspiration pneumonia since hx of stroke and vomiting -  Continue azithromycin day 3 -  Continue unasyn -  F/u blood cultures (see below) -  Legionella pending and S. pneumo ag neg -  Flu PCR neg -  SLP assistance appreciated:  Regular with thin liquid -  CXR:  Worsening infiltrates   Coag negative staph blood culture, contaminant -  D/c vancomycin -  Repeat blood culture negative  Acute encephalopathy, likely secondary to infection, gradually improving per family  Nausea and vomiting, may be secondary to pneumonia, resolving -  CT without evidence of underlying intraabdominal process to explain these symptoms  AKI, creatinine trended up to 1.5 and now up to 1.66 today.  -  FENa and UA -  No evidence of obstruction on CT -  Decreased IVF   Hypertension, currently hypotensive, continue to hold losartan  Hx of stroke, stable, continue aspirin and simvastatin  Generalized weakness, PT evaluation.  Suspect that he will  need SNF.    Diet:  Healthy heart Access:  PIV IVF:  yes Proph:  lovenox  Code Status: DNR Family Communication: patient, his wife and his daughter Disposition Plan: pending improvement in sepsis, afebrile  F/u blood culture, creatinine trending down, blood pressure stable, likely to SNF   Consultants:  none  Procedures:  CT abd/pelvis  Antibiotics:  Ceftriaxone 1/1  Azithromycin 1/1  Vancomycin 1/2 > 1/3  HPI/Subjective:  Having cough, SOB at rest.  Denies nausea, vomiting, abdominal pain.  Objective: Filed Vitals:   09/24/15 2100 09/25/15 0500 09/25/15 0958 09/25/15 1300  BP: 95/42 114/46 117/51   Pulse: 81 75 70   Temp: 100.6 F (38.1 C) 98.5 F (36.9 C) 98.6 F (37 C)   TempSrc: Oral Oral Oral   Resp: 16 18 18    Height:    5\' 4"  (1.626 m)  Weight: 75 kg (165 lb 5.5 oz)     SpO2: 95% 94% 94%     Intake/Output Summary (Last 24 hours) at 09/25/15 1646 Last data filed at 09/25/15 1300  Gross per 24 hour  Intake 1838.33 ml  Output    825 ml  Net 1013.33 ml   Filed Weights   09/23/15 2034 09/24/15 2100  Weight: 73.5 kg (162 lb 0.6 oz) 75 kg (165 lb 5.5 oz)   Body mass index is 28.37 kg/(m^2).  Exam:   General:  adult male, No acute distress, frequent cough, wheezing audible  HEENT:  NCAT, MMM  Cardiovascular:  RRR, nl S1, S2 no mrg, 2+ pulses, warm extremities  Respiratory:  rhonchorous bilateral breath sounds, rales throughout the left  chest and also at right base to mid back, + wheezing, no increased WOB  Abdomen:   NABS, soft, NT/ND  MSK:   Normal tone and bulk, no LEE  Data Reviewed: Basic Metabolic Panel:  Recent Labs Lab 09/23/15 1555 09/24/15 0458 09/25/15 0520  NA 137 141 137  K 4.9 4.6 3.7  CL 100* 107 109  CO2 23 24 22   GLUCOSE 134* 109* 96  BUN 26* 30* 39*  CREATININE 1.35* 1.50* 1.66*  CALCIUM 9.2 8.1* 7.5*   Liver Function Tests:  Recent Labs Lab 09/23/15 1555 09/24/15 0458  AST 66* 65*  ALT 32 34  ALKPHOS  50 39  BILITOT 1.2 0.8  PROT 7.7 6.3*  ALBUMIN 4.1 3.1*   No results for input(s): LIPASE, AMYLASE in the last 168 hours. No results for input(s): AMMONIA in the last 168 hours. CBC:  Recent Labs Lab 09/23/15 1555 09/24/15 0458 09/25/15 0520  WBC 10.4 9.9 7.8  NEUTROABS 8.4* 6.7  --   HGB 15.2 13.2 11.0*  HCT 43.7 39.1 32.2*  MCV 93.0 93.8 92.8  PLT 228 193 170    Recent Results (from the past 240 hour(s))  Urine culture     Status: None   Collection Time: 09/23/15  3:50 PM  Result Value Ref Range Status   Specimen Description URINE, RANDOM  Final   Special Requests NONE  Final   Culture MULTIPLE SPECIES PRESENT, SUGGEST RECOLLECTION  Final   Report Status 09/25/2015 FINAL  Final  Blood culture (routine x 2)     Status: None   Collection Time: 09/23/15  3:55 PM  Result Value Ref Range Status   Specimen Description BLOOD RIGHT ANTECUBITAL  Final   Special Requests BOTTLES DRAWN AEROBIC AND ANAEROBIC 5CC  Final   Culture  Setup Time   Final    GRAM POSITIVE COCCI IN CLUSTERS AEROBIC BOTTLE ONLY CRITICAL RESULT CALLED TO, READ BACK BY AND VERIFIED WITH: J ADAMS,RN AT 1237 09/24/15 BY L BENFIELD    Culture   Final    STAPHYLOCOCCUS SPECIES (COAGULASE NEGATIVE) THE SIGNIFICANCE OF ISOLATING THIS ORGANISM FROM A SINGLE SET OF BLOOD CULTURES WHEN MULTIPLE SETS ARE DRAWN IS UNCERTAIN. PLEASE NOTIFY THE MICROBIOLOGY DEPARTMENT WITHIN ONE WEEK IF SPECIATION AND SENSITIVITIES ARE REQUIRED.    Report Status 09/25/2015 FINAL  Final  Blood culture (routine x 2)     Status: None (Preliminary result)   Collection Time: 09/23/15  4:00 PM  Result Value Ref Range Status   Specimen Description BLOOD RIGHT HAND  Final   Special Requests IN PEDIATRIC BOTTLE 3CC  Final   Culture NO GROWTH 2 DAYS  Final   Report Status PENDING  Incomplete  Culture, blood (single)     Status: None (Preliminary result)   Collection Time: 09/24/15  2:04 PM  Result Value Ref Range Status   Specimen  Description BLOOD RIGHT ANTECUBITAL  Final   Special Requests BOTTLES DRAWN AEROBIC ONLY 10CCS  Final   Culture NO GROWTH < 24 HOURS  Final   Report Status PENDING  Incomplete     Studies: Ct Abdomen Pelvis Wo Contrast  09/24/2015  CLINICAL DATA:  Fever nausea vomiting since yesterday. EXAM: CT ABDOMEN AND PELVIS WITHOUT CONTRAST TECHNIQUE: Multidetector CT imaging of the abdomen and pelvis was performed following the standard protocol without IV contrast. COMPARISON:  06/08/2012 FINDINGS: Lower chest: 4 mm posterior right middle lobe pulmonary nodule noted. Right lower lobe collapse/consolidation with mild atelectasis or infiltrate in the left base.  Hepatobiliary: No focal abnormality in the liver on this study without intravenous contrast. No evidence of hepatomegaly. Small rim calcified structures along the posterior liver capsular stable. Patient is status post cholecystectomy. No intrahepatic or extrahepatic biliary dilation. Pancreas: No focal mass lesion. No dilatation of the main duct. No intraparenchymal cyst. No peripancreatic edema. Spleen: No splenomegaly. No focal mass lesion. Adrenals/Urinary Tract: No adrenal nodule or mass. Right kidney is unremarkable. There is evidence of central sinus cysts in the left kidney, as seen previously. Fine calcification along the wall of 1 of the cysts is also stable. No evidence for hydroureter. The urinary bladder appears normal for the degree of distention. Stomach/Bowel: Stomach is nondistended. No gastric wall thickening. No evidence of outlet obstruction. Duodenum is normally positioned as is the ligament of Treitz. No small bowel wall thickening. No small bowel dilatation. The terminal ileum is normal. The appendix is normal. No gross colonic mass. No colonic wall thickening. No substantial diverticular change. Vascular/Lymphatic: There is abdominal aortic atherosclerosis without aneurysm. There is no gastrohepatic or hepatoduodenal ligament  lymphadenopathy. No intraperitoneal or retroperitoneal lymphadenopy. No pelvic sidewall lymphadenopathy. Reproductive: Prostate gland appears enlarged. Other: No intraperitoneal free fluid. Musculoskeletal: Gas loculation the subcutaneous fat of the lower left anterior abdominal wall presumably represent injection site. Left inguinal hernia contains prominent venous anatomy and varicocele could have this appearance. Bone windows reveal no worrisome lytic or sclerotic osseous lesions. IMPRESSION: 1. No CT findings to explain the patient's history of nausea and vomiting with fever. 2. Bilateral lower lobe collapse/consolidation suspicious for pneumonia. 3. 4 mm right middle lobe pulmonary nodule. If the patient is at high risk for bronchogenic carcinoma, follow-up chest CT at 1 year is recommended. If the patient is at low risk, no follow-up is needed. This recommendation follows the consensus statement: Guidelines for Management of Small Pulmonary Nodules Detected on CT Scans: A Statement from the Fleischner Society as published in Radiology 2005; 237:395-400. 4. Central sinus cysts in the left kidney. 5. Prostatomegaly. 6. Abdominal aortic atherosclerosis. Electronically Signed   By: Kennith CenterEric  Mansell M.D.   On: 09/24/2015 11:50   Ct Head Wo Contrast  09/24/2015  CLINICAL DATA:  Acute onset of productive cough and chills. Nausea and vomiting. Mild shortness of breath. Initial encounter. EXAM: CT HEAD WITHOUT CONTRAST TECHNIQUE: Contiguous axial images were obtained from the base of the skull through the vertex without intravenous contrast. COMPARISON:  CT of the head performed 07/18/2009, and MRI/MRA of the head performed 07/19/2009 FINDINGS: There is no evidence of acute infarction, mass lesion, or intra- or extra-axial hemorrhage on CT. Prominence of ventricles and sulci reflects mild cortical volume loss. Scattered periventricular and subcortical white matter change likely reflects small vessel ischemic  microangiopathy. Chronic lacunar infarcts are noted at the basal ganglia bilaterally. The brainstem and fourth ventricle are within normal limits. The cerebral hemispheres demonstrate grossly normal gray-white differentiation. No mass effect or midline shift is seen. There is no evidence of fracture; visualized osseous structures are unremarkable in appearance. The visualized portions of the orbits are within normal limits. The paranasal sinuses and mastoid air cells are well-aerated. No significant soft tissue abnormalities are seen. IMPRESSION: 1. No acute intracranial pathology seen on CT. 2. Mild cortical volume loss and scattered small vessel microangiopathy. 3. Chronic lacunar infarcts at the basal ganglia bilaterally. Electronically Signed   By: Roanna RaiderJeffery  Chang M.D.   On: 09/24/2015 00:10   Dg Chest Port 1 View  09/25/2015  CLINICAL DATA:  Wheezing and shortness  of breath. Patient reports a diagnosis of pneumonia. Also have a syncopal episode 3 days ago. EXAM: PORTABLE CHEST 1 VIEW COMPARISON:  09/23/2015 FINDINGS: Cardiac silhouette is normal in size. No mediastinal or hilar masses. Interstitial opacities at the lung bases have increased most evident on the right. There is milder perihilar interstitial prominence, greater on the right. No convincing pleural effusion.  No pneumothorax. Bony thorax is demineralized but grossly intact. IMPRESSION: 1. Worsening lung aeration when compared the prior exam, and when allowing for differences in technique. There is an increase in interstitial opacities at the lung bases, right greater than left. Findings may reflect acute bronchitis/bronchopneumonia, atelectasis or a combination. Electronically Signed   By: Amie Portland M.D.   On: 09/25/2015 13:50    Scheduled Meds: . ampicillin-sulbactam (UNASYN) IV  3 g Intravenous Q8H  . aspirin  325 mg Oral Daily  . azithromycin  500 mg Intravenous Q24H  . cholecalciferol  1,000 Units Oral Daily  . enoxaparin (LOVENOX)  injection  40 mg Subcutaneous QHS  . pantoprazole  40 mg Oral Daily  . simvastatin  40 mg Oral q1800  . vancomycin  1,000 mg Intravenous Q24H   Continuous Infusions:    Principal Problem:   Pneumonia Active Problems:   HTN (hypertension)   CAP (community acquired pneumonia)   Acute encephalopathy   ARF (acute renal failure) (HCC)   Nausea with vomiting    Time spent: 30 min    Phuoc Huy  [facility] Pager [phone]. If 7PM-7AM, please contact night-coverage at [email or web address], password [facility] 09/25/2015, 4:46 PM  LOS: 2 days

## 2015-09-25 NOTE — Progress Notes (Signed)
Speech Language Pathology Treatment: Dysphagia  Patient Details Name: Eric Berger MRN: [id number] DOB: 07-Dec-1925 Today's Date: 09/25/2015 Time: [id number] SLP Time Calculation (min) (ACUTE ONLY): 12 min  Assessment / Plan / Recommendation Clinical Impression  Skilled treatment session focused on addressing diagnostic treatment of dysphagia.  Patient was observed to consume thin liquids via cup with Mod verbal and visual cues for small portions, which still resulted in intermittent delayed throat clears and coughs.  RN informed SLP that patient frequently insists on continuing to use a straw which consistently resulted in coughing.  Updated chest x-ray results also reviewed and as a result recommend an objective assessment for trials of compensatory strategies as well as for awareness and education purposes.  Plan for MBS tomorrow.     HPI HPI: 82 y.o. male with history of hypertension, stroke, hyperlipidemia, GERD and hernia brought to the ER after patient had sudden onset of multiple episodes of nausea vomiting and productive cough for last for 5 days. Per MD note pt found to be febrile and chest x-ray shows possible infiltrates concerning for pneumonia.CT abdomen " not findings to explain nausea and vomiting". Pt reports using thick liquids for "awhile after stroke and I hated it" .      SLP Plan  Continue with current plan of care;Other (Comment) (objective assessment)     Recommendations  Diet recommendations: Dysphagia 3 (mechanical soft);Thin liquid Liquids provided via: Cup;No straw Medication Administration: Whole meds with liquid Supervision: Patient able to self feed;Full supervision/cueing for compensatory strategies Compensations: Slow rate;Small sips/bites Postural Changes and/or Swallow Maneuvers: Seated upright 90 degrees              Oral Care Recommendations: Oral care BID Follow up Recommendations: Other (comment) (TBD) Plan: Continue with current plan of  care;Other (Comment) (objective assessment)  Fae PippinMelissa Namya Voges, M.A., CCC-SLP [phone]  Taleshia Luff 09/25/2015, 4:50 PM

## 2015-09-25 NOTE — Progress Notes (Signed)
SLP Cancellation Note  Patient Details Name: Eric Berger MRN: [id number] DOB: Mar 10, 1926   Cancelled treatment:       Reason Eval/Treat Not Completed: Patient at procedure or test/unavailable.  Will follow up as able.  Fae PippinMelissa Deshawnda Acrey, M.A., CCC-SLP [phone]  Riot Barrick 09/25/2015, 10:01 AM

## 2015-09-26 ENCOUNTER — Inpatient Hospital Stay (HOSPITAL_COMMUNITY): Payer: Medicare HMO

## 2015-09-26 LAB — CBC
HEMATOCRIT: 32.5 % — AB (ref 39.0–52.0)
HEMOGLOBIN: 11 g/dL — AB (ref 13.0–17.0)
MCH: 31.1 pg (ref 26.0–34.0)
MCHC: 33.8 g/dL (ref 30.0–36.0)
MCV: 91.8 fL (ref 78.0–100.0)
Platelets: 190 10*3/uL (ref 150–400)
RBC: 3.54 MIL/uL — ABNORMAL LOW (ref 4.22–5.81)
RDW: 12.7 % (ref 11.5–15.5)
WBC: 7.2 10*3/uL (ref 4.0–10.5)

## 2015-09-26 LAB — BASIC METABOLIC PANEL
ANION GAP: 8 (ref 5–15)
BUN: 30 mg/dL — AB (ref 6–20)
CHLORIDE: 110 mmol/L (ref 101–111)
CO2: 21 mmol/L — ABNORMAL LOW (ref 22–32)
Calcium: 7.9 mg/dL — ABNORMAL LOW (ref 8.9–10.3)
Creatinine, Ser: 1.36 mg/dL — ABNORMAL HIGH (ref 0.61–1.24)
GFR calc Af Amer: 52 mL/min — ABNORMAL LOW (ref >60)
GFR, EST NON AFRICAN AMERICAN: 45 mL/min — AB (ref >60)
GLUCOSE: 108 mg/dL — AB (ref 65–99)
POTASSIUM: 3.6 mmol/L (ref 3.5–5.1)
Sodium: 139 mmol/L (ref 135–145)

## 2015-09-26 LAB — URINALYSIS, ROUTINE W REFLEX MICROSCOPIC
BILIRUBIN URINE: NEGATIVE
GLUCOSE, UA: NEGATIVE mg/dL
KETONES UR: NEGATIVE mg/dL
Nitrite: NEGATIVE
PH: 5.5 (ref 5.0–8.0)
Protein, ur: 30 mg/dL — AB
SPECIFIC GRAVITY, URINE: 1.024 (ref 1.005–1.030)

## 2015-09-26 LAB — URINE MICROSCOPIC-ADD ON

## 2015-09-26 LAB — LEGIONELLA ANTIGEN, URINE

## 2015-09-26 LAB — CREATININE, URINE, RANDOM: Creatinine, Urine: 142.53 mg/dL

## 2015-09-26 LAB — SODIUM, URINE, RANDOM: Sodium, Ur: 32 mmol/L

## 2015-09-26 MED ORDER — METHYLPREDNISOLONE SODIUM SUCC 125 MG IJ SOLR
60.0000 mg | Freq: Two times a day (BID) | INTRAMUSCULAR | Status: AC
  Administered 2015-09-26 (×2): 60 mg via INTRAVENOUS
  Filled 2015-09-26 (×2): qty 2

## 2015-09-26 MED ORDER — PREDNISONE 50 MG PO TABS
50.0000 mg | ORAL_TABLET | Freq: Every day | ORAL | Status: DC
  Administered 2015-09-27: 50 mg via ORAL
  Filled 2015-09-26: qty 1

## 2015-09-26 NOTE — Care Management Important Message (Signed)
Important Message  Patient Details  Name: Eric Berger MRN: [id number] Date of Birth: 1925/10/28   Medicare Important Message Given:  Yes    Adib Wahba P Estelle Greenleaf 09/26/2015, 1:22 PM

## 2015-09-26 NOTE — Progress Notes (Addendum)
Speech Language Pathology  Patient Details Name: Eric IvanWilliam H Berger MRN: [id number] DOB: 08-03-1926 Today's Date: 09/26/2015 Time:  -1055     MBS completed. Full report to be documented. Pharyngeal penetration to vocal cords (silent), pharyngeal residue ranged from mild to max, decreased awareness and inability to clear. Honey thick liquids are recommended, however pt has stated on 2 separate occasions that he will NOT drink thick liquids again. SLP explained chronic aspiration risk and he responded "that's just a risk I'll have to take." SLP will leave pt on thin liquids and Dys 3 texture; he is a DNR. SLP will attempt to speak with pt's wife as well. Concern for future repeat hospitalizations for pna.   [name] M.Ed [employer] IndustriesCCC-SLP Pager [phone]

## 2015-09-26 NOTE — Evaluation (Signed)
Physical Therapy Evaluation Patient Details Name: Eric Berger MRN: [id number] DOB: 05/01/1926 Today's Date: 09/26/2015   History of Present Illness  82 y.o. male with history of hypertension, stroke, hyperlipidemia, GERD and hernia brought to the ER after patient had sudden onset of multiple episodes of nausea vomiting and productive cough for last for 5 days. Per MD note pt found to be febrile and chest x-ray shows possible infiltrates concerning for pneumonia.  Clinical Impression  Pt admitted with the above complications. Pt currently with functional limitations due to the deficits listed below (see PT Problem List). Ambulates slowly with a rolling walker but without overt loss of balance. SpO2 94% on both 2 and 3L supplemental O2 but quite dyspneic on 2L. Reports he was independent at home prior to this admission. Difficulty focus at times and with delayed thought processing. Pt will benefit from skilled PT to increase their independence and safety with mobility to allow discharge to the venue listed below.       Follow Up Recommendations Other (comment) Would be appropriate with HHPT if family can provide 24/7 supervision due to cognitive difficulties. Otherwise will benefit from ST-SNF for rehab prior to returning home.    Equipment Recommendations  None recommended by PT    Recommendations for Other Services OT consult     Precautions / Restrictions Precautions Precautions: Fall Precaution Comments: monitor O2 Restrictions Weight Bearing Restrictions: No      Mobility  Bed Mobility Overal bed mobility: Needs Assistance Bed Mobility: Supine to Sit;Sit to Supine     Supine to sit: Supervision Sit to supine: Supervision   General bed mobility comments: Supervision for safety. Cues for technique  Transfers Overall transfer level: Needs assistance Equipment used: Rolling walker (2 wheeled) Transfers: Sit to/from Stand Sit to Stand: Min guard         General  transfer comment: min guard for safety. VC for hand placement. Performed from recliner  Ambulation/Gait Ambulation/Gait assistance: Supervision Ambulation Distance (Feet): 100 Feet Assistive device: Rolling walker (2 wheeled) Gait Pattern/deviations: Step-through pattern;Decreased stride length;Trunk flexed Gait velocity: slow Gait velocity interpretation: <1.8 ft/sec, indicative of risk for recurrent falls General Gait Details: Very slow but overall stable with RW for support. Intermittent cues to continue ambulating as he is occasionally distracted. No overt loss of balance. Cues for larger steps and forward gaze with upright posture. SpO2 94% on 3L supplemental O2 with 2/4 dyspnea. SpO2 94% on 2L supplemental O2 with 3/4 dyspnea. Cues for pursed lip breathing.  Stairs            Wheelchair Mobility    Modified Rankin (Stroke Patients Only)       Balance Overall balance assessment: Needs assistance Sitting-balance support: No upper extremity supported;Feet supported Sitting balance-Leahy Scale: Good     Standing balance support: No upper extremity supported Standing balance-Leahy Scale: Fair                               Pertinent Vitals/Pain Pain Assessment: No/denies pain    Home Living Family/patient expects to be discharged to:: Private residence Living Arrangements: Spouse/significant other;Children Available Help at Discharge: Family;Available 24 hours/day Type of Home: House Home Access: Stairs to enter Entrance Stairs-Rails: Doctor, general practiceight;Left Entrance Stairs-Number of Steps: 3 Home Layout: One level Home Equipment: Shower seat;Bedside commode;Walker - 2 wheels;Hospital bed;Cane - single point      Prior Function Level of Independence: Independent  Hand Dominance   Dominant Hand: Right    Extremity/Trunk Assessment   Upper Extremity Assessment: Defer to OT evaluation           Lower Extremity Assessment: Generalized  weakness         Communication   Communication: No difficulties  Cognition Arousal/Alertness: Awake/alert Behavior During Therapy: WFL for tasks assessed/performed Overall Cognitive Status: Impaired/Different from baseline Area of Impairment: Orientation;Problem solving;Following commands Orientation Level: Disoriented to;Situation     Following Commands: Follows one step commands with increased time     Problem Solving: Slow processing;Difficulty sequencing;Requires verbal cues      General Comments      Exercises        Assessment/Plan    PT Assessment Patient needs continued PT services  PT Diagnosis Abnormality of gait;Generalized weakness;Altered mental status   PT Problem List Decreased strength;Decreased activity tolerance;Decreased balance;Decreased mobility;Decreased cognition;Decreased knowledge of use of DME;Cardiopulmonary status limiting activity  PT Treatment Interventions DME instruction;Gait training;Stair training;Functional mobility training;Therapeutic activities;Therapeutic exercise;Balance training;Patient/family education;Cognitive remediation   PT Goals (Current goals can be found in the Care Plan section) Acute Rehab PT Goals Patient Stated Goal: Go home PT Goal Formulation: With patient Time For Goal Achievement: 10/10/15 Potential to Achieve Goals: Good    Frequency Min 3X/week   Barriers to discharge        Co-evaluation               End of Session Equipment Utilized During Treatment: Oxygen Activity Tolerance: Patient tolerated treatment well Patient left: in bed;with call bell/phone within reach;with bed alarm set Nurse Communication: Mobility status         Time: 1610-9604 PT Time Calculation (min) (ACUTE ONLY): 25 min   Charges:   PT Evaluation $PT Eval Low Complexity: 1 Procedure PT Treatments $Gait Training: 8-22 mins   PT G CodesBerton Mount 09/26/2015, 10:47 AM  Charlsie Merles,  PT [phone]

## 2015-09-26 NOTE — Progress Notes (Signed)
[facility] Progress Note  Patient: Eric IvanWilliam H Werling ZOX:[id number]   PCP: Leo GrosserPICKARD,WARREN TOM, MD DOB: September 12, 1926   DOA: 09/23/2015   DOS: 09/26/2015   Date of Service: the patient was seen and examined on 09/26/2015  Subjective: Patient mentions his breathing is better. Does not use oxygen at his baseline. Denies having any chest pain or abdominal pain. Nutrition: Able to tolerate oral diet and working with speech therapy Activity: Working with physical therapy Last BM: 09/26/2015  Assessment and Plan: 1. Pneumonia sepsis with acute hypoxic respiratory failure, suspected aspiration pneumonia. Complete azithromycin for day 5, continue Unasyn. Legionella streptococcus negative, flu PCR negative, coagulase-negative blood culture contaminant. Continues to have some wheezing and tachypnea will continue close monitoring.  2. Acute encephalopathy. Most likely delirium secondary to infection. Improving and started at his baseline.  3.  Acute kidney injury. Improving. We'll continue close monitoring.  4. History of CVA. Dyslipidemia. Continuing aspirin and simvastatin.  DVT Prophylaxis: subcutaneous Heparin Nutrition: Following recommendation from the speech therapy Advance goals of care discussion: DO NOT RESUSCITATE  Brief Summary of Hospitalization:  HPI: As per the H and P dictated on admission, "Eric IvanWilliam H Berger is a 82 y.o. male with history of hypertension, stroke, hyperlipidemia was brought to the ER after patient had sudden onset of multiple episodes of nausea vomiting. Patient's wife states that prior to that patient has been having productive cough for last for 5 days. In the ER patient was found to be febrile. Chest x-ray shows possible infiltrates concerning for pneumonia. Patient also appears confused but follows commands. Abdomen appears benign. Blood cultures were obtained along with urine cultures and admitted for fever most likely from pneumonia. Patient's wife stated  that he has become confused after having the fever. Has not had any recent sick contacts or recent travel." Daily update, Procedures: Admitted on 09/23/2015, modified barium swallow on 09/26/2015 Consultants: None Antibiotics: Anti-infectives    Start     Dose/Rate Route Frequency Ordered Stop   09/24/15 1900  Ampicillin-Sulbactam (UNASYN) 3 g in sodium chloride 0.9 % 100 mL IVPB     3 g 100 mL/hr over 60 Minutes Intravenous Every 8 hours 09/24/15 1813     09/24/15 1800  cefTRIAXone (ROCEPHIN) 1 g in dextrose 5 % 50 mL IVPB  Status:  Discontinued     1 g 100 mL/hr over 30 Minutes Intravenous Every 24 hours 09/23/15 2144 09/24/15 1809   09/24/15 1800  azithromycin (ZITHROMAX) 500 mg in dextrose 5 % 250 mL IVPB     500 mg 250 mL/hr over 60 Minutes Intravenous Every 24 hours 09/23/15 2144 10/01/15 1759   09/24/15 1500  vancomycin (VANCOCIN) IVPB 1000 mg/200 mL premix  Status:  Discontinued     1,000 mg 200 mL/hr over 60 Minutes Intravenous Every 24 hours 09/24/15 1408 09/26/15 0918   09/23/15 1700  cefTRIAXone (ROCEPHIN) 1 g in dextrose 5 % 50 mL IVPB     1 g 100 mL/hr over 30 Minutes Intravenous  Once 09/23/15 1647 09/23/15 1901   09/23/15 1700  azithromycin (ZITHROMAX) 500 mg in dextrose 5 % 250 mL IVPB     500 mg 250 mL/hr over 60 Minutes Intravenous  Once 09/23/15 1647 09/23/15 1814       Family Communication: no family was present at bedside, at the time of interview.   Disposition:  Expected discharge date: 09/27/2015 Barriers to safe discharge: Improvement in oxygenation   Intake/Output Summary (Last 24 hours) at 09/26/15 1327 Last data filed at  09/26/15 0900  Gross per 24 hour  Intake 1458.33 ml  Output    501 ml  Net 957.33 ml   Filed Weights   09/23/15 2034 09/24/15 2100 09/25/15 2100  Weight: 73.5 kg (162 lb 0.6 oz) 75 kg (165 lb 5.5 oz) 75.3 kg (166 lb 0.1 oz)    Objective: Physical Exam: Filed Vitals:   09/25/15 2100 09/26/15 0519 09/26/15 1002 09/26/15 1222   BP: 115/55 127/68 129/55   Pulse: 95 71 76 78  Temp: 98.5 F (36.9 C) 98.1 F (36.7 C) 97.9 F (36.6 C)   TempSrc: Oral Oral Oral   Resp: 18 22 19 22   Height:      Weight: 75.3 kg (166 lb 0.1 oz)     SpO2: 95% 96% 100% 100%     General: Appear in mild distress, no Rash; Oral Mucosa moist. Cardiovascular: S1 and S2 Present, no Murmur, no JVD Respiratory: Bilateral Air entry present and basal Crackles, bilateral wheezes Abdomen: Bowel Sound present, Soft and no tenderness Extremities: no Pedal edema, no calf tenderness Neurology: Grossly no focal neuro deficit.  Data Reviewed: CBC:  Recent Labs Lab 09/23/15 1555 09/24/15 0458 09/25/15 0520 09/26/15 0515  WBC 10.4 9.9 7.8 7.2  NEUTROABS 8.4* 6.7  --   --   HGB 15.2 13.2 11.0* 11.0*  HCT 43.7 39.1 32.2* 32.5*  MCV 93.0 93.8 92.8 91.8  PLT 228 193 170 190   Basic Metabolic Panel:  Recent Labs Lab 09/23/15 1555 09/24/15 0458 09/25/15 0520 09/26/15 0515  NA 137 141 137 139  K 4.9 4.6 3.7 3.6  CL 100* 107 109 110  CO2 23 24 22  21*  GLUCOSE 134* 109* 96 108*  BUN 26* 30* 39* 30*  CREATININE 1.35* 1.50* 1.66* 1.36*  CALCIUM 9.2 8.1* 7.5* 7.9*   Liver Function Tests:  Recent Labs Lab 09/23/15 1555 09/24/15 0458  AST 66* 65*  ALT 32 34  ALKPHOS 50 39  BILITOT 1.2 0.8  PROT 7.7 6.3*  ALBUMIN 4.1 3.1*   No results for input(s): LIPASE, AMYLASE in the last 168 hours. No results for input(s): AMMONIA in the last 168 hours.  Cardiac Enzymes:  Recent Labs Lab 09/23/15 1555 09/24/15 0458  CKTOTAL  --  1516*  TROPONINI 0.03  --     BNP (last 3 results) No results for input(s): BNP in the last 8760 hours.  CBG: No results for input(s): GLUCAP in the last 168 hours.  Recent Results (from the past 240 hour(s))  Urine culture     Status: None   Collection Time: 09/23/15  3:50 PM  Result Value Ref Range Status   Specimen Description URINE, RANDOM  Final   Special Requests NONE  Final   Culture  MULTIPLE SPECIES PRESENT, SUGGEST RECOLLECTION  Final   Report Status 09/25/2015 FINAL  Final  Blood culture (routine x 2)     Status: None   Collection Time: 09/23/15  3:55 PM  Result Value Ref Range Status   Specimen Description BLOOD RIGHT ANTECUBITAL  Final   Special Requests BOTTLES DRAWN AEROBIC AND ANAEROBIC 5CC  Final   Culture  Setup Time   Final    GRAM POSITIVE COCCI IN CLUSTERS AEROBIC BOTTLE ONLY CRITICAL RESULT CALLED TO, READ BACK BY AND VERIFIED WITH: J ADAMS,RN AT 1237 09/24/15 BY L BENFIELD    Culture   Final    STAPHYLOCOCCUS SPECIES (COAGULASE NEGATIVE) THE SIGNIFICANCE OF ISOLATING THIS ORGANISM FROM A SINGLE SET OF BLOOD CULTURES WHEN  MULTIPLE SETS ARE DRAWN IS UNCERTAIN. PLEASE NOTIFY THE MICROBIOLOGY DEPARTMENT WITHIN ONE WEEK IF SPECIATION AND SENSITIVITIES ARE REQUIRED.    Report Status 09/25/2015 FINAL  Final  Blood culture (routine x 2)     Status: None (Preliminary result)   Collection Time: 09/23/15  4:00 PM  Result Value Ref Range Status   Specimen Description BLOOD RIGHT HAND  Final   Special Requests IN PEDIATRIC BOTTLE 3CC  Final   Culture NO GROWTH 2 DAYS  Final   Report Status PENDING  Incomplete  Culture, blood (single)     Status: None (Preliminary result)   Collection Time: 09/24/15  2:04 PM  Result Value Ref Range Status   Specimen Description BLOOD RIGHT ANTECUBITAL  Final   Special Requests BOTTLES DRAWN AEROBIC ONLY 10CCS  Final   Culture NO GROWTH < 24 HOURS  Final   Report Status PENDING  Incomplete     Studies: Dg Chest Port 1 View  09/25/2015  CLINICAL DATA:  Wheezing and shortness of breath. Patient reports a diagnosis of pneumonia. Also have a syncopal episode 3 days ago. EXAM: PORTABLE CHEST 1 VIEW COMPARISON:  09/23/2015 FINDINGS: Cardiac silhouette is normal in size. No mediastinal or hilar masses. Interstitial opacities at the lung bases have increased most evident on the right. There is milder perihilar interstitial prominence,  greater on the right. No convincing pleural effusion.  No pneumothorax. Bony thorax is demineralized but grossly intact. IMPRESSION: 1. Worsening lung aeration when compared the prior exam, and when allowing for differences in technique. There is an increase in interstitial opacities at the lung bases, right greater than left. Findings may reflect acute bronchitis/bronchopneumonia, atelectasis or a combination. Electronically Signed   By: Amie Portland M.D.   On: 09/25/2015 13:50     Scheduled Meds: . ampicillin-sulbactam (UNASYN) IV  3 g Intravenous Q8H  . aspirin  325 mg Oral Daily  . azithromycin  500 mg Intravenous Q24H  . cholecalciferol  1,000 Units Oral Daily  . enoxaparin (LOVENOX) injection  40 mg Subcutaneous QHS  . ipratropium-albuterol  3 mL Nebulization QID  . methylPREDNISolone (SOLU-MEDROL) injection  60 mg Intravenous Q12H  . pantoprazole  40 mg Oral Daily  . [START ON 09/27/2015] predniSONE  50 mg Oral Q breakfast  . saccharomyces boulardii  250 mg Oral BID  . simvastatin  40 mg Oral q1800   Continuous Infusions:  PRN Meds: acetaminophen **OR** acetaminophen, hydrALAZINE, ondansetron **OR** ondansetron (ZOFRAN) IV  Time spent: 30 minutes  Author: Lynden Oxford, MD [facility] Pager: [phone] 09/26/2015 1:27 PM  If 7PM-7AM, please contact night-coverage at [email or web address], password [facility]

## 2015-09-27 LAB — BASIC METABOLIC PANEL
Anion gap: 9 (ref 5–15)
BUN: 24 mg/dL — AB (ref 6–20)
CALCIUM: 8.5 mg/dL — AB (ref 8.9–10.3)
CHLORIDE: 111 mmol/L (ref 101–111)
CO2: 23 mmol/L (ref 22–32)
CREATININE: 0.94 mg/dL (ref 0.61–1.24)
GFR calc Af Amer: >60 mL/min (ref >60)
GFR calc non Af Amer: >60 mL/min (ref >60)
Glucose, Bld: 161 mg/dL — ABNORMAL HIGH (ref 65–99)
Potassium: 4.1 mmol/L (ref 3.5–5.1)
SODIUM: 143 mmol/L (ref 135–145)

## 2015-09-27 LAB — HEPATIC FUNCTION PANEL
ALBUMIN: 2.6 g/dL — AB (ref 3.5–5.0)
ALT: 54 U/L (ref 17–63)
AST: 48 U/L — ABNORMAL HIGH (ref 15–41)
Alkaline Phosphatase: 49 U/L (ref 38–126)
BILIRUBIN DIRECT: 0.2 mg/dL (ref 0.1–0.5)
BILIRUBIN INDIRECT: 0.5 mg/dL (ref 0.3–0.9)
BILIRUBIN TOTAL: 0.7 mg/dL (ref 0.3–1.2)
Total Protein: 6.5 g/dL (ref 6.5–8.1)

## 2015-09-27 LAB — CK: CK TOTAL: 227 U/L (ref 49–397)

## 2015-09-27 LAB — CBC
HCT: 32.9 % — ABNORMAL LOW (ref 39.0–52.0)
Hemoglobin: 11.2 g/dL — ABNORMAL LOW (ref 13.0–17.0)
MCH: 31.5 pg (ref 26.0–34.0)
MCHC: 34 g/dL (ref 30.0–36.0)
MCV: 92.7 fL (ref 78.0–100.0)
PLATELETS: 215 10*3/uL (ref 150–400)
RBC: 3.55 MIL/uL — ABNORMAL LOW (ref 4.22–5.81)
RDW: 12.9 % (ref 11.5–15.5)
WBC: 6.9 10*3/uL (ref 4.0–10.5)

## 2015-09-27 MED ORDER — METHYLPREDNISOLONE SODIUM SUCC 125 MG IJ SOLR
60.0000 mg | Freq: Four times a day (QID) | INTRAMUSCULAR | Status: DC
  Administered 2015-09-27 – 2015-09-28 (×4): 60 mg via INTRAVENOUS
  Filled 2015-09-27 (×4): qty 2

## 2015-09-27 MED ORDER — RESOURCE THICKENUP CLEAR PO POWD
ORAL | Status: DC | PRN
  Filled 2015-09-27: qty 125

## 2015-09-27 NOTE — [place] FL2 (Signed)
[place] MEDICAID FL2 LEVEL OF CARE SCREENING TOOL     IDENTIFICATION  Patient Name: Eric Berger Birthdate: [birth year] Sex: male Admission Date (Current Location): 09/23/2015  [facility] [place]   Facility and Address:  [facility] [place], [address], [place], [place] [address]      Provider Number: [id number]  Attending Physician Name and Address:  Rolly SalterPranav M Patel, MD  Relative Name and Phone Number:  Butler DenmarkLillie S. Rickman, wife.  Phone #[phone].    Current Level of Care: Hospital Recommended Level of Care: Skilled Nursing Facility Prior Approval Number:    Date Approved/Denied:   PASRR Number: [id number] (Eff. 07/24/09)  Discharge Plan: SNF    Current Diagnoses: Patient Active Problem List   Diagnosis Date Noted  . Sepsis (HCC) 09/25/2015  . CAP (community acquired pneumonia) 09/23/2015  . Pneumonia 09/23/2015  . Acute encephalopathy 09/23/2015  . ARF (acute renal failure) (HCC) 09/23/2015  . Nausea with vomiting 09/23/2015  . HTN (hypertension) 11/07/2014  . Encounter for long-term (current) use of other high-risk medications 11/07/2014  . BRONCHITIS, ACUTE 12/02/2007  . G E R D 12/02/2007  . ASPIRATION FOREIGN BODY 12/02/2007    Orientation RESPIRATION BLADDER Height & Weight    Self, Time, Situation, Place  Normal Incontinent   169 lbs.  BEHAVIORAL SYMPTOMS/MOOD NEUROLOGICAL BOWEL NUTRITION STATUS      Incontinent Diet (DYS 3)  AMBULATORY STATUS COMMUNICATION OF NEEDS Skin   Supervision Verbally Normal                       Personal Care Assistance Level of Assistance  Bathing, Feeding, Dressing Bathing Assistance: Limited assistance Feeding assistance: Independent Dressing Assistance: Limited assistance     Functional Limitations Info  Sight, Hearing Sight Info: Adequate Hearing Info: Adequate Speech Info: Adequate    SPECIAL CARE FACTORS FREQUENCY  PT (By licensed PT), Speech therapy      PT Frequency: Evaluated 1/4. A minimum of 3X per week recommended       Speech Therapy Frequency: Evaluation 09/24/15      Contractures Contractures Info: Not present    Additional Factors Info  Code Status Code Status Info: DNR             Current Medications (09/27/2015):  This is the current hospital active medication list Current Facility-Administered Medications  Medication Dose Route Frequency Provider Last Rate Last Dose  . acetaminophen (TYLENOL) tablet 650 mg  650 mg Oral Q6H PRN Eduard ClosArshad N Kakrakandy, MD       Or  . acetaminophen (TYLENOL) suppository 650 mg  650 mg Rectal Q6H PRN Eduard ClosArshad N Kakrakandy, MD      . Ampicillin-Sulbactam (UNASYN) 3 g in sodium chloride 0.9 % 100 mL IVPB  3 g Intravenous Q8H Para MarchNathan J Batchelder, RPH   3 g at 09/27/15 0257  . aspirin tablet 325 mg  325 mg Oral Daily Eduard ClosArshad N Kakrakandy, MD   325 mg at 09/27/15 0940  . azithromycin (ZITHROMAX) 500 mg in dextrose 5 % 250 mL IVPB  500 mg Intravenous Q24H Eduard ClosArshad N Kakrakandy, MD   500 mg at 09/26/15 1824  . cholecalciferol (VITAMIN D) tablet 1,000 Units  1,000 Units Oral Daily Eduard ClosArshad N Kakrakandy, MD   1,000 Units at 09/27/15 0940  . enoxaparin (LOVENOX) injection 40 mg  40 mg Subcutaneous QHS Eduard ClosArshad N Kakrakandy, MD   40 mg at 09/26/15 2255  . hydrALAZINE (APRESOLINE) injection 10 mg  10 mg Intravenous Q4H PRN Eduard Clos, MD      . ipratropium-albuterol (DUONEB) 0.5-2.5 (3) MG/3ML nebulizer solution 3 mL  3 mL Nebulization QID Renae Fickle, MD   3 mL at 09/27/15 0940  . ondansetron (ZOFRAN) tablet 4 mg  4 mg Oral Q6H PRN Eduard Clos, MD       Or  . ondansetron [facility]) injection 4 mg  4 mg Intravenous Q6H PRN Eduard Clos, MD      . pantoprazole (PROTONIX) EC tablet 40 mg  40 mg Oral Daily Eduard Clos, MD   40 mg at 09/27/15 0940  . saccharomyces boulardii (FLORASTOR) capsule 250 mg  250 mg Oral BID Renae Fickle, MD   250 mg at 09/27/15 0940  . simvastatin  (ZOCOR) tablet 40 mg  40 mg Oral q1800 Eduard Clos, MD   40 mg at 09/26/15 1823     Discharge Medications: Please see discharge summary for a list of discharge medications.  Relevant Imaging Results:  Relevant Lab Results:   Additional Information    Okey Dupre, Lazaro Arms, LCSW

## 2015-09-27 NOTE — Progress Notes (Signed)
Physical Therapy Treatment Patient Details Name: Eric Berger MRN: [id number] DOB: 11/19/1925 Today's Date: 09/27/2015    History of Present Illness 82 y.o. male with history of hypertension, stroke, hyperlipidemia, GERD and hernia brought to the ER after patient had sudden onset of multiple episodes of nausea vomiting and productive cough for last for 5 days. Per MD note pt found to be febrile and chest x-ray shows possible infiltrates concerning for pneumonia.    PT Comments    Several episodes of loss of balance today requiring physical assist to correct. Unless pt family can provide adequate assistance pt would benefit from ST-SNF prior to returning home. SpO2 94% while ambulating with 3 and 4L supplemental O2. More dyspneic today and increased coughing throughout therapy session. Pt in good spirits and was eager to get OOB and work with therapy. Patient will continue to benefit from skilled physical therapy services to further improve independence with functional mobility.   Follow Up Recommendations  SNF;Supervision/Assistance - 24 hour     Equipment Recommendations  None recommended by PT    Recommendations for Other Services       Precautions / Restrictions Precautions Precautions: Fall Precaution Comments: monitor O2 Restrictions Weight Bearing Restrictions: No    Mobility  Bed Mobility Overal bed mobility: Needs Assistance Bed Mobility: Supine to Sit     Supine to sit: Supervision     General bed mobility comments: Supervision for safety. Cues for technique  Transfers Overall transfer level: Needs assistance Equipment used: Rolling walker (2 wheeled) Transfers: Sit to/from Stand Sit to Stand: Min assist         General transfer comment: Min assist for loss of balance to posterior upon standing, required to sit back onto bed. Denied dizziness but feels "off-balance." Improved second attempt. VC for hand placment.  Ambulation/Gait Ambulation/Gait  assistance: Min assist Ambulation Distance (Feet): 100 Feet Assistive device: Rolling walker (2 wheeled) Gait Pattern/deviations: Step-through pattern;Decreased stride length;Leaning posteriorly;Trunk flexed Gait velocity: decreased Gait velocity interpretation: Below normal speed for age/gender General Gait Details: Improved speed slightly today but with increased distraction, releasing RW at times and losing balance requiring min assist to regain stability. VC for safety. SpO2 94% on 3 and 4L supplemental O2 while ambulating, increased dyspnea today 3/4 throughout therapy session.   Stairs            Wheelchair Mobility    Modified Rankin (Stroke Patients Only)       Balance                                    Cognition Arousal/Alertness: Awake/alert Behavior During Therapy: WFL for tasks assessed/performed Overall Cognitive Status: No family/caregiver present to determine baseline cognitive functioning Area of Impairment: Safety/judgement         Safety/Judgement: Decreased awareness of safety;Decreased awareness of deficits   Problem Solving: Slow processing;Difficulty sequencing;Requires verbal cues      Exercises General Exercises - Lower Extremity Ankle Circles/Pumps: AROM;Both;10 reps;Seated    General Comments General comments (skin integrity, edema, etc.): Sounds more congested today with more coughing noted.      Pertinent Vitals/Pain Pain Assessment: No/denies pain    Home Living                      Prior Function            PT Goals (current goals can now be found in  the care plan section) Acute Rehab PT Goals Patient Stated Goal: Go home PT Goal Formulation: With patient Time For Goal Achievement: 10/10/15 Potential to Achieve Goals: Good Progress towards PT goals: Progressing toward goals    Frequency  Min 3X/week    PT Plan Discharge plan needs to be updated    Co-evaluation             End of  Session Equipment Utilized During Treatment: Oxygen Activity Tolerance: Patient tolerated treatment well Patient left: with call bell/phone within reach;in chair;with chair alarm set     Time: [id number] PT Time Calculation (min) (ACUTE ONLY): 19 min  Charges:  $Gait Training: 8-22 mins                    G Codes:      [place], Hugo Lybrand S 09/27/2015, 6:08 PM  Sunday SpillersLogan Secor [place], [place] [phone]

## 2015-09-27 NOTE — Progress Notes (Signed)
[facility] Progress Note  Patient: Eric Berger ZOX:[id number]   PCP: Leo GrosserPICKARD,WARREN TOM, MD DOB: August 19, 1926   DOA: 09/23/2015   DOS: 09/27/2015   Date of Service: the patient was seen and examined on 09/27/2015  Subjective: Patient continues to have increased work of breathing. Also has persistent cough with expectoration. No fever no chills no chest pain and abdominal pain and nausea. Nutrition: Able to tolerate oral diet and working with speech therapy Activity: Working with physical therapy Last BM: 09/26/2015  Assessment and Plan: 1. Pneumonia sepsis with acute hypoxic respiratory failure, suspected aspiration pneumonia. Complete azithromycin for day 5, continue Unasyn. Legionella streptococcus negative, flu PCR negative, coagulase-negative blood culture contaminant. Continues to have some wheezing and tachypnea, continue duo nebs and increase steroids to every 6 hours soluMedrol.  2. Acute encephalopathy. Most likely delirium secondary to infection. Improving and started at his baseline.  3.  Acute kidney injury. Improving. We'll continue close monitoring.  4. History of CVA. Dyslipidemia. Continuing aspirin and simvastatin.  5. Dysphagia. Appreciate input from speech therapy. Patient is on honey thick liquids. Patient is at recurrent admission regarding aspiration pneumonia.  DVT Prophylaxis: subcutaneous Heparin Nutrition: Honey thick liquids Advance goals of care discussion: DO NOT RESUSCITATE  Brief Summary of Hospitalization:  HPI: As per the H and P dictated on admission, "Eric IvanWilliam H Massmann is a 82 y.o. male with history of hypertension, stroke, hyperlipidemia was brought to the ER after patient had sudden onset of multiple episodes of nausea vomiting. Patient's wife states that prior to that patient has been having productive cough for last for 5 days. In the ER patient was found to be febrile. Chest x-ray shows possible infiltrates concerning for  pneumonia. Patient also appears confused but follows commands. Abdomen appears benign. Blood cultures were obtained along with urine cultures and admitted for fever most likely from pneumonia. Patient's wife stated that he has become confused after having the fever. Has not had any recent sick contacts or recent travel." Daily update, Procedures: Admitted on 09/23/2015, modified barium swallow on 09/26/2015 Consultants: None Antibiotics: Anti-infectives    Start     Dose/Rate Route Frequency Ordered Stop   09/24/15 1900  Ampicillin-Sulbactam (UNASYN) 3 g in sodium chloride 0.9 % 100 mL IVPB     3 g 100 mL/hr over 60 Minutes Intravenous Every 8 hours 09/24/15 1813     09/24/15 1800  cefTRIAXone (ROCEPHIN) 1 g in dextrose 5 % 50 mL IVPB  Status:  Discontinued     1 g 100 mL/hr over 30 Minutes Intravenous Every 24 hours 09/23/15 2144 09/24/15 1809   09/24/15 1800  azithromycin (ZITHROMAX) 500 mg in dextrose 5 % 250 mL IVPB     500 mg 250 mL/hr over 60 Minutes Intravenous Every 24 hours 09/23/15 2144 10/01/15 1759   09/24/15 1500  vancomycin (VANCOCIN) IVPB 1000 mg/200 mL premix  Status:  Discontinued     1,000 mg 200 mL/hr over 60 Minutes Intravenous Every 24 hours 09/24/15 1408 09/26/15 0918   09/23/15 1700  cefTRIAXone (ROCEPHIN) 1 g in dextrose 5 % 50 mL IVPB     1 g 100 mL/hr over 30 Minutes Intravenous  Once 09/23/15 1647 09/23/15 1901   09/23/15 1700  azithromycin (ZITHROMAX) 500 mg in dextrose 5 % 250 mL IVPB     500 mg 250 mL/hr over 60 Minutes Intravenous  Once 09/23/15 1647 09/23/15 1814      Family Communication: family was present at bedside, at the time of interview.  All the questions were answered satisfactorily.  Disposition:  Expected discharge date: 09/29/2015 Barriers to safe discharge: Improvement in oxygenation   Intake/Output Summary (Last 24 hours) at 09/27/15 1515 Last data filed at 09/27/15 1430  Gross per 24 hour  Intake   1010 ml  Output   1851 ml  Net    -841 ml   Filed Weights   09/24/15 2100 09/25/15 2100 09/26/15 2026  Weight: 75 kg (165 lb 5.5 oz) 75.3 kg (166 lb 0.1 oz) 77 kg (169 lb 12.1 oz)    Objective: Physical Exam: Filed Vitals:   09/27/15 0620 09/27/15 0807 09/27/15 1155 09/27/15 1503  BP: 140/61 151/72  131/56  Pulse: 70 75  83  Temp: 97.5 F (36.4 C) 97.4 F (36.3 C)  97.5 F (36.4 C)  TempSrc: Oral Oral  Oral  Resp: 20 20  18   Height:      Weight:      SpO2: 96% 97% 98% 94%    General: Appear in mild distress, no Rash; Oral Mucosa moist. Cardiovascular: S1 and S2 Present, no Murmur, no JVD Respiratory: Bilateral Air entry present and basal Crackles, bilateral wheezes Abdomen: Bowel Sound present, Soft and no tenderness Extremities: no Pedal edema, no calf tenderness  Data Reviewed: CBC:  Recent Labs Lab 09/23/15 1555 09/24/15 0458 09/25/15 0520 09/26/15 0515 09/27/15 0416  WBC 10.4 9.9 7.8 7.2 6.9  NEUTROABS 8.4* 6.7  --   --   --   HGB 15.2 13.2 11.0* 11.0* 11.2*  HCT 43.7 39.1 32.2* 32.5* 32.9*  MCV 93.0 93.8 92.8 91.8 92.7  PLT 228 193 170 190 215   Basic Metabolic Panel:  Recent Labs Lab 09/23/15 1555 09/24/15 0458 09/25/15 0520 09/26/15 0515 09/27/15 0416  NA 137 141 137 139 143  K 4.9 4.6 3.7 3.6 4.1  CL 100* 107 109 110 111  CO2 23 24 22  21* 23  GLUCOSE 134* 109* 96 108* 161*  BUN 26* 30* 39* 30* 24*  CREATININE 1.35* 1.50* 1.66* 1.36* 0.94  CALCIUM 9.2 8.1* 7.5* 7.9* 8.5*   Liver Function Tests:  Recent Labs Lab 09/23/15 1555 09/24/15 0458 09/27/15 0416  AST 66* 65* 48*  ALT 32 34 54  ALKPHOS 50 39 49  BILITOT 1.2 0.8 0.7  PROT 7.7 6.3* 6.5  ALBUMIN 4.1 3.1* 2.6*   No results for input(s): LIPASE, AMYLASE in the last 168 hours. No results for input(s): AMMONIA in the last 168 hours.  Cardiac Enzymes:  Recent Labs Lab 09/23/15 1555 09/24/15 0458 09/27/15 0416  CKTOTAL  --  1516* 227  TROPONINI 0.03  --   --     BNP (last 3 results) No results for  input(s): BNP in the last 8760 hours.  CBG: No results for input(s): GLUCAP in the last 168 hours.  Recent Results (from the past 240 hour(s))  Urine culture     Status: None   Collection Time: 09/23/15  3:50 PM  Result Value Ref Range Status   Specimen Description URINE, RANDOM  Final   Special Requests NONE  Final   Culture MULTIPLE SPECIES PRESENT, SUGGEST RECOLLECTION  Final   Report Status 09/25/2015 FINAL  Final  Blood culture (routine x 2)     Status: None   Collection Time: 09/23/15  3:55 PM  Result Value Ref Range Status   Specimen Description BLOOD RIGHT ANTECUBITAL  Final   Special Requests BOTTLES DRAWN AEROBIC AND ANAEROBIC 5CC  Final   Culture  Setup Time  Final    GRAM POSITIVE COCCI IN CLUSTERS AEROBIC BOTTLE ONLY CRITICAL RESULT CALLED TO, READ BACK BY AND VERIFIED WITH: J ADAMS,RN AT 1237 09/24/15 BY L BENFIELD    Culture   Final    STAPHYLOCOCCUS SPECIES (COAGULASE NEGATIVE) THE SIGNIFICANCE OF ISOLATING THIS ORGANISM FROM A SINGLE SET OF BLOOD CULTURES WHEN MULTIPLE SETS ARE DRAWN IS UNCERTAIN. PLEASE NOTIFY THE MICROBIOLOGY DEPARTMENT WITHIN ONE WEEK IF SPECIATION AND SENSITIVITIES ARE REQUIRED.    Report Status 09/25/2015 FINAL  Final  Blood culture (routine x 2)     Status: None (Preliminary result)   Collection Time: 09/23/15  4:00 PM  Result Value Ref Range Status   Specimen Description BLOOD RIGHT HAND  Final   Special Requests IN PEDIATRIC BOTTLE 3CC  Final   Culture NO GROWTH 4 DAYS  Final   Report Status PENDING  Incomplete  Culture, blood (single)     Status: None (Preliminary result)   Collection Time: 09/24/15  2:04 PM  Result Value Ref Range Status   Specimen Description BLOOD RIGHT ANTECUBITAL  Final   Special Requests BOTTLES DRAWN AEROBIC ONLY 10CCS  Final   Culture NO GROWTH 3 DAYS  Final   Report Status PENDING  Incomplete  Culture, blood (single)     Status: None (Preliminary result)   Collection Time: 09/25/15  5:20 AM  Result Value  Ref Range Status   Specimen Description BLOOD RIGHT ANTECUBITAL  Final   Special Requests IN PEDIATRIC BOTTLE 3CC  Final   Culture NO GROWTH 1 DAY  Final   Report Status PENDING  Incomplete     Studies: No results found.   Scheduled Meds: . ampicillin-sulbactam (UNASYN) IV  3 g Intravenous Q8H  . aspirin  325 mg Oral Daily  . azithromycin  500 mg Intravenous Q24H  . enoxaparin (LOVENOX) injection  40 mg Subcutaneous QHS  . ipratropium-albuterol  3 mL Nebulization QID  . methylPREDNISolone (SOLU-MEDROL) injection  60 mg Intravenous Q6H  . pantoprazole  40 mg Oral Daily  . saccharomyces boulardii  250 mg Oral BID  . simvastatin  40 mg Oral q1800   Continuous Infusions:  PRN Meds: acetaminophen **OR** acetaminophen, ondansetron **OR** ondansetron (ZOFRAN) IV, RESOURCE THICKENUP CLEAR  Time spent: 30 minutes  Author: Lynden Oxford, MD [facility] Pager: [phone] 09/27/2015 3:15 PM  If 7PM-7AM, please contact night-coverage at [email or web address], password [facility]

## 2015-09-27 NOTE — Progress Notes (Signed)
Speech Language Pathology Treatment: Dysphagia  Patient Details Name: Eric Berger MRN: [id number] DOB: 08/29/1926 Today's Date: 09/27/2015 Time: [id number] SLP Time Calculation (min) (ACUTE ONLY): 18 min  Assessment / Plan / Recommendation Clinical Impression  Dr. Allena KatzPatel informed this SLP that pt is agreeable to have thick liquids per his conversation with pt/family yesterday (family encouraged pt). SLP educated (reviewed) pt and family re: thickener; pt had thick liquids several years ago after stroke. Will place pt on the water protocol following discussion with MD: pt may have sips thin water (only) 30 minutes after meals, after thorough oral care and not with pills. Family states they think [facility] offers the water protocol. Diet modified to nectar thick liquids, explaining to family that both nectar and honey thick are both aspiration risk), continue Dys 3 texture.   HPI HPI: 82 y.o. male with history of hypertension, stroke, hyperlipidemia, GERD and hernia brought to the ER after patient had sudden onset of multiple episodes of nausea vomiting and productive cough for last for 5 days. Per MD note pt found to be febrile and chest x-ray shows possible infiltrates concerning for pneumonia.CT abdomen " not findings to explain nausea and vomiting". Pt reports using thick liquids for "awhile after stroke and I hated it". MBS recommended to fully assess swallow function.      SLP Plan  Continue with current plan of care     Recommendations  Diet recommendations: Dysphagia 3 (mechanical soft);Nectar-thick liquid (pt agreed to thick liquids) Liquids provided via: Cup;No straw Medication Administration: Whole meds with puree Supervision: Patient able to self feed;Full supervision/cueing for compensatory strategies Compensations: Slow rate;Small sips/bites;Multiple dry swallows after each bite/sip;Clear throat intermittently Postural Changes and/or Swallow Maneuvers: Seated upright 90  degrees              Oral Care Recommendations: Oral care BID Follow up Recommendations: Skilled Nursing facility Plan: Continue with current plan of care   Eric MacadamiaLitaker, Eric Berger 09/27/2015, 11:17 AM  Eric CoonsLisa Berger Lonell FaceLitaker M.Ed [employer] Pager [phone]

## 2015-09-28 LAB — CULTURE, BLOOD (ROUTINE X 2): Culture: NO GROWTH

## 2015-09-28 LAB — BASIC METABOLIC PANEL
Anion gap: 12 (ref 5–15)
BUN: 29 mg/dL — AB (ref 6–20)
CHLORIDE: 110 mmol/L (ref 101–111)
CO2: 23 mmol/L (ref 22–32)
Calcium: 8.6 mg/dL — ABNORMAL LOW (ref 8.9–10.3)
Creatinine, Ser: 1.21 mg/dL (ref 0.61–1.24)
GFR calc Af Amer: >60 mL/min (ref >60)
GFR calc non Af Amer: 52 mL/min — ABNORMAL LOW (ref >60)
Glucose, Bld: 149 mg/dL — ABNORMAL HIGH (ref 65–99)
POTASSIUM: 3.3 mmol/L — AB (ref 3.5–5.1)
SODIUM: 145 mmol/L (ref 135–145)

## 2015-09-28 LAB — CBC
HEMATOCRIT: 34.7 % — AB (ref 39.0–52.0)
Hemoglobin: 11.8 g/dL — ABNORMAL LOW (ref 13.0–17.0)
MCH: 31.2 pg (ref 26.0–34.0)
MCHC: 34 g/dL (ref 30.0–36.0)
MCV: 91.8 fL (ref 78.0–100.0)
Platelets: 300 10*3/uL (ref 150–400)
RBC: 3.78 MIL/uL — ABNORMAL LOW (ref 4.22–5.81)
RDW: 13 % (ref 11.5–15.5)
WBC: 18.7 10*3/uL — AB (ref 4.0–10.5)

## 2015-09-28 MED ORDER — AMOXICILLIN-POT CLAVULANATE 500-125 MG PO TABS
1.0000 | ORAL_TABLET | Freq: Three times a day (TID) | ORAL | Status: DC
  Administered 2015-09-28 – 2015-10-02 (×11): 500 mg via ORAL
  Filled 2015-09-28 (×12): qty 1

## 2015-09-28 MED ORDER — LOSARTAN POTASSIUM 50 MG PO TABS
50.0000 mg | ORAL_TABLET | Freq: Every day | ORAL | Status: DC
  Administered 2015-09-28 – 2015-10-03 (×6): 50 mg via ORAL
  Filled 2015-09-28 (×6): qty 1

## 2015-09-28 MED ORDER — PREDNISONE 50 MG PO TABS
50.0000 mg | ORAL_TABLET | Freq: Every day | ORAL | Status: DC
  Administered 2015-09-28 – 2015-09-30 (×3): 50 mg via ORAL
  Filled 2015-09-28 (×3): qty 1

## 2015-09-28 NOTE — Clinical Social Work Placement (Signed)
   CLINICAL SOCIAL WORK PLACEMENT  NOTE  Date:  09/28/2015  Patient Details  Name: Eric Berger MRN: [id number] Date of Birth: 1926-06-29  Clinical Social Work is seeking post-discharge placement for this patient at the Skilled  Nursing Facility level of care (*CSW will initial, date and re-position this form in  chart as items are completed):  No (Family expressed facility preference)   [facility]/family provided [facility] Clinical [facility] Department's list of facilities offering this level of care within the geographic area requested by the patient (or if unable, by the patient's family).  Yes   Patient/family informed of their freedom to choose among providers that offer the needed level of care, that participate in Medicare, Medicaid or managed care program needed by the patient, have an available bed and are willing to accept the patient.  Yes   Patient/family informed of [facility]'s ownership interest in [facility] and [facility], as well as of the fact that they are under no obligation to receive care at these facilities.  PASRR submitted to EDS on       PASRR number received on       Existing PASRR number confirmed on 09/27/15 (Eff. 07/24/09)     FL2 transmitted to all facilities in geographic area requested by pt/family on 09/27/15     FL2 transmitted to all facilities within larger geographic area on       Patient informed that his/her managed care company has contracts with or will negotiate with certain facilities, including the following:        Yes   Patient/family informed of bed offers received.  [facility]     Physician recommends and patient chooses bed at      [facility].  Patient to be transferred to facility by [facility])     Patient family notified on   of transfer.  Name of family member notified:        PHYSICIAN        Additional Comment:  09/28/15 - [employer] representative Amy [phone]) called at 1 pm regarding authorization.   _______________________________________________ Cristobal Goldmannrawford, [place]ee Roszak Bradley, LCSW 09/28/2015, 3:36 PM

## 2015-09-28 NOTE — Progress Notes (Signed)
[facility] Progress Note  Patient: Eric Berger ZOX:[id number]   PCP: Leo Grosser, MD DOB: 26-Apr-1926   DOA: 09/23/2015   DOS: 09/28/2015   Date of Service: the patient was seen and examined on 09/28/2015  Subjective: Significant improvement after increasing the dose of steroids. Denies having any chest pain abdominal pain nausea vomiting. No diarrhea. Nutrition: Able to tolerate oral diet and working with speech therapy Activity: Working with physical therapy Last BM: 09/27/2015  Assessment and Plan: 1. Pneumonia sepsis with acute hypoxic respiratory failure, suspected aspiration pneumonia. Steroid induced hyperglycemia as well as leukocytosis.  Transition to Augmentin today. Transition to prednisone. Legionella streptococcus negative, flu PCR negative, coagulase-negative Staphylococcus aureus in blood culture contaminant. continue duo nebs and transitioned to prednisone today. We will do a slow taper. We will monitor urine the hospital for 1 more day probable discharge tomorrow.   2. Acute encephalopathy. Most likely delirium secondary to infection. Improving and started at his baseline.  3.  Acute kidney injury. Resolved. We'll continue close monitoring.  4. History of CVA. Dyslipidemia. Continuing aspirin and simvastatin.  5. Dysphagia. Appreciate input from speech therapy. Patient is on honey thick liquids. Patient is at recurrent admission regarding aspiration pneumonia.  DVT Prophylaxis: subcutaneous Heparin Nutrition: Honey thick liquids Advance goals of care discussion: DO NOT RESUSCITATE  Brief Summary of Hospitalization:  HPI: As per the H and P dictated on admission, "Eric Berger is a 82 y.o. male with history of hypertension, stroke, hyperlipidemia was brought to the ER after patient had sudden onset of multiple episodes of nausea vomiting. Patient's wife states that prior to that patient has been having productive cough for last for 5  days. In the ER patient was found to be febrile. Chest x-ray shows possible infiltrates concerning for pneumonia. Patient also appears confused but follows commands. Abdomen appears benign. Blood cultures were obtained along with urine cultures and admitted for fever most likely from pneumonia. Patient's wife stated that he has become confused after having the fever. Has not had any recent sick contacts or recent travel." Daily update, Procedures: Admitted on 09/23/2015, modified barium swallow on 09/26/2015 Consultants: None Antibiotics: Anti-infectives    Start     Dose/Rate Route Frequency Ordered Stop   09/28/15 1400  amoxicillin-clavulanate (AUGMENTIN) 500-125 MG per tablet 500 mg     1 tablet Oral 3 times per day 09/28/15 1013     09/24/15 1900  Ampicillin-Sulbactam (UNASYN) 3 g in sodium chloride 0.9 % 100 mL IVPB  Status:  Discontinued     3 g 100 mL/hr over 60 Minutes Intravenous Every 8 hours 09/24/15 1813 09/28/15 1013   09/24/15 1800  cefTRIAXone (ROCEPHIN) 1 g in dextrose 5 % 50 mL IVPB  Status:  Discontinued     1 g 100 mL/hr over 30 Minutes Intravenous Every 24 hours 09/23/15 2144 09/24/15 1809   09/24/15 1800  azithromycin (ZITHROMAX) 500 mg in dextrose 5 % 250 mL IVPB  Status:  Discontinued     500 mg 250 mL/hr over 60 Minutes Intravenous Every 24 hours 09/23/15 2144 09/28/15 1013   09/24/15 1500  vancomycin (VANCOCIN) IVPB 1000 mg/200 mL premix  Status:  Discontinued     1,000 mg 200 mL/hr over 60 Minutes Intravenous Every 24 hours 09/24/15 1408 09/26/15 0918   09/23/15 1700  cefTRIAXone (ROCEPHIN) 1 g in dextrose 5 % 50 mL IVPB     1 g 100 mL/hr over 30 Minutes Intravenous  Once 09/23/15 1647 09/23/15 1901  09/23/15 1700  azithromycin (ZITHROMAX) 500 mg in dextrose 5 % 250 mL IVPB     500 mg 250 mL/hr over 60 Minutes Intravenous  Once 09/23/15 1647 09/23/15 1814      Family Communication: family was present at bedside, at the time of interview. All the questions were  answered satisfactorily.  Disposition:  Expected discharge date: 09/29/2015 Barriers to safe discharge: Improvement in oxygenation   Intake/Output Summary (Last 24 hours) at 09/28/15 1713 Last data filed at 09/28/15 1354  Gross per 24 hour  Intake    900 ml  Output   1175 ml  Net   -275 ml   Filed Weights   09/25/15 2100 09/26/15 2026 09/27/15 2013  Weight: 75.3 kg (166 lb 0.1 oz) 77 kg (169 lb 12.1 oz) 76.6 kg (168 lb 14 oz)    Objective: Physical Exam: Filed Vitals:   09/28/15 0857 09/28/15 1152 09/28/15 1501 09/28/15 1508  BP:    126/74  Pulse:    82  Temp:    98 F (36.7 C)  TempSrc:    Oral  Resp:    18  Height:      Weight:      SpO2: 98% 99% 98% 98%    General: Appear in mild distress, no Rash; Oral Mucosa moist. Cardiovascular: S1 and S2 Present, no Murmur, no JVD Respiratory: Bilateral Air entry present and basal Crackles, improved wheezes Abdomen: Bowel Sound present, Soft and no tenderness Extremities: no Pedal edema, no calf tenderness  Data Reviewed: CBC:  Recent Labs Lab 09/23/15 1555 09/24/15 0458 09/25/15 0520 09/26/15 0515 09/27/15 0416 09/28/15 0542  WBC 10.4 9.9 7.8 7.2 6.9 18.7*  NEUTROABS 8.4* 6.7  --   --   --   --   HGB 15.2 13.2 11.0* 11.0* 11.2* 11.8*  HCT 43.7 39.1 32.2* 32.5* 32.9* 34.7*  MCV 93.0 93.8 92.8 91.8 92.7 91.8  PLT 228 193 170 190 215 300   Basic Metabolic Panel:  Recent Labs Lab 09/24/15 0458 09/25/15 0520 09/26/15 0515 09/27/15 0416 09/28/15 0542  NA 141 137 139 143 145  K 4.6 3.7 3.6 4.1 3.3*  CL 107 109 110 111 110  CO2 24 22 21* 23 23  GLUCOSE 109* 96 108* 161* 149*  BUN 30* 39* 30* 24* 29*  CREATININE 1.50* 1.66* 1.36* 0.94 1.21  CALCIUM 8.1* 7.5* 7.9* 8.5* 8.6*   Liver Function Tests:  Recent Labs Lab 09/23/15 1555 09/24/15 0458 09/27/15 0416  AST 66* 65* 48*  ALT 32 34 54  ALKPHOS 50 39 49  BILITOT 1.2 0.8 0.7  PROT 7.7 6.3* 6.5  ALBUMIN 4.1 3.1* 2.6*   No results for input(s):  LIPASE, AMYLASE in the last 168 hours. No results for input(s): AMMONIA in the last 168 hours.  Cardiac Enzymes:  Recent Labs Lab 09/23/15 1555 09/24/15 0458 09/27/15 0416  CKTOTAL  --  1516* 227  TROPONINI 0.03  --   --     BNP (last 3 results) No results for input(s): BNP in the last 8760 hours.  CBG: No results for input(s): GLUCAP in the last 168 hours.  Recent Results (from the past 240 hour(s))  Urine culture     Status: None   Collection Time: 09/23/15  3:50 PM  Result Value Ref Range Status   Specimen Description URINE, RANDOM  Final   Special Requests NONE  Final   Culture MULTIPLE SPECIES PRESENT, SUGGEST RECOLLECTION  Final   Report Status 09/25/2015 FINAL  Final  Blood culture (routine x 2)     Status: None   Collection Time: 09/23/15  3:55 PM  Result Value Ref Range Status   Specimen Description BLOOD RIGHT ANTECUBITAL  Final   Special Requests BOTTLES DRAWN AEROBIC AND ANAEROBIC 5CC  Final   Culture  Setup Time   Final    GRAM POSITIVE COCCI IN CLUSTERS AEROBIC BOTTLE ONLY CRITICAL RESULT CALLED TO, READ BACK BY AND VERIFIED WITH: J ADAMS,RN AT 1237 09/24/15 BY L BENFIELD    Culture   Final    STAPHYLOCOCCUS SPECIES (COAGULASE NEGATIVE) THE SIGNIFICANCE OF ISOLATING THIS ORGANISM FROM A SINGLE SET OF BLOOD CULTURES WHEN MULTIPLE SETS ARE DRAWN IS UNCERTAIN. PLEASE NOTIFY THE MICROBIOLOGY DEPARTMENT WITHIN ONE WEEK IF SPECIATION AND SENSITIVITIES ARE REQUIRED.    Report Status 09/25/2015 FINAL  Final  Blood culture (routine x 2)     Status: None   Collection Time: 09/23/15  4:00 PM  Result Value Ref Range Status   Specimen Description BLOOD RIGHT HAND  Final   Special Requests IN PEDIATRIC BOTTLE 3CC  Final   Culture NO GROWTH 5 DAYS  Final   Report Status 09/28/2015 FINAL  Final  Culture, blood (single)     Status: None (Preliminary result)   Collection Time: 09/24/15  2:04 PM  Result Value Ref Range Status   Specimen Description BLOOD RIGHT  ANTECUBITAL  Final   Special Requests BOTTLES DRAWN AEROBIC ONLY 10CCS  Final   Culture NO GROWTH 4 DAYS  Final   Report Status PENDING  Incomplete  Culture, blood (single)     Status: None (Preliminary result)   Collection Time: 09/25/15  5:20 AM  Result Value Ref Range Status   Specimen Description BLOOD RIGHT ANTECUBITAL  Final   Special Requests IN PEDIATRIC BOTTLE 3CC  Final   Culture NO GROWTH 2 DAYS  Final   Report Status PENDING  Incomplete     Studies: No results found.   Scheduled Meds: . amoxicillin-clavulanate  1 tablet Oral 3 times per day  . aspirin  325 mg Oral Daily  . enoxaparin (LOVENOX) injection  40 mg Subcutaneous QHS  . ipratropium-albuterol  3 mL Nebulization QID  . losartan  50 mg Oral Daily  . pantoprazole  40 mg Oral Daily  . predniSONE  50 mg Oral Q breakfast  . saccharomyces boulardii  250 mg Oral BID  . simvastatin  40 mg Oral q1800   Continuous Infusions:  PRN Meds: acetaminophen **OR** acetaminophen, ondansetron **OR** ondansetron (ZOFRAN) IV, RESOURCE THICKENUP CLEAR  Time spent: 30 minutes  Author: Lynden OxfordPranav Patel, MD [facility] Pager: [phone] 09/28/2015 5:13 PM  If 7PM-7AM, please contact night-coverage at [email or web address], password [email or web address]

## 2015-09-28 NOTE — Clinical Social Work Note (Signed)
Clinical Social Work Assessment  Patient Details  Name: Eric Berger MRN: [id number] Date of Birth: 04-09-1926  Date of referral:  09/27/15               Reason for consult:  Facility Placement                Permission sought to share information with:  Family Supports Permission granted to share information::  Yes, Verbal Permission Granted  Name::     Candace CruiseLillie Hilley (wife) and Foye SpurlingSherry Sealy (daughter)  Agency::     Relationship::  Wife and daughter  Contact Information:  [phone]  Housing/Transportation Living arrangements for the past 2 months:  Single Family Home Source of Information:  Adult Children, Spouse Patient Interpreter Needed:  None Criminal Activity/Legal Involvement Pertinent to Current Situation/Hospitalization:  No - Comment as needed Significant Relationships:  Adult Children, Spouse Lives with:  Adult Children, Spouse Do you feel safe going back to the place where you live?  Yes Need for family participation in patient care:  Yes (Comment)  Care giving concerns:  None expressed by patient/wife or daughter.   Social Worker assessment / plan:  CSW talked with patient and his wife and daughter who were at the bedside regarding discharge planning.  Patient was in bed, alert, oriented and mainly listened during the conversation.  Mrs. Ruthann CancerGilliland expressed that they really want [facility] and discussion was had regarding choosing an alternate facility if necessary.  Family expressed understanding regarding the need to choose another facility but are very hopeful that patient is able to go to [facility] for rehab.    Employment status:  Retired Database administratornsurance information:  Managed Medicare PT Recommendations:  Skilled Nursing Facility Information / Referral to community resources:  Skilled Nursing Facility (Family provided CSW with facility preference)  Patient/Family's Response to care:  No concerns expressed regarding patient's care during  hospitalization.  Patient/Family's Understanding of and Emotional Response to Diagnosis, Current Treatment, and Prognosis: Not discussed.  Emotional Assessment Appearance:  Appears stated age Attitude/Demeanor/Rapport:  Other (Appropriate) Affect (typically observed):  Pleasant, Quiet (Patient was awake and alert, however permitted wife and daughter to primarily talk with CSW.) Orientation:  Oriented to Self, Oriented to Place, Oriented to  Time, Oriented to Situation Alcohol / Substance use:  Tobacco Use (Patient reported that he quit smoking about 53 years ago. Patient also reported that he does not drink or use illicit drugs.) Psych involvement (Current and /or in the community):  No (Comment)  Discharge Needs  Concerns to be addressed:  Discharge Planning Concerns Readmission within the last 30 days:  No Current discharge risk:  None Barriers to Discharge:  Continued Medical Work up   [employer]rawford, Lazaro ArmsVanessa Bradley, LCSW 09/28/2015, 3:27 PM

## 2015-09-29 ENCOUNTER — Inpatient Hospital Stay (HOSPITAL_COMMUNITY): Payer: Medicare HMO

## 2015-09-29 DIAGNOSIS — I340 Nonrheumatic mitral (valve) insufficiency: Secondary | ICD-10-CM

## 2015-09-29 DIAGNOSIS — J209 Acute bronchitis, unspecified: Secondary | ICD-10-CM

## 2015-09-29 LAB — BASIC METABOLIC PANEL
Anion gap: 11 (ref 5–15)
BUN: 30 mg/dL — ABNORMAL HIGH (ref 6–20)
CO2: 24 mmol/L (ref 22–32)
Calcium: 8.7 mg/dL — ABNORMAL LOW (ref 8.9–10.3)
Chloride: 109 mmol/L (ref 101–111)
Creatinine, Ser: 1.11 mg/dL (ref 0.61–1.24)
GFR calc Af Amer: >60 mL/min (ref >60)
GFR calc non Af Amer: 58 mL/min — ABNORMAL LOW (ref >60)
Glucose, Bld: 138 mg/dL — ABNORMAL HIGH (ref 65–99)
Potassium: 3.3 mmol/L — ABNORMAL LOW (ref 3.5–5.1)
Sodium: 144 mmol/L (ref 135–145)

## 2015-09-29 LAB — CBC
HCT: 34.9 % — ABNORMAL LOW (ref 39.0–52.0)
Hemoglobin: 11.8 g/dL — ABNORMAL LOW (ref 13.0–17.0)
MCH: 31.1 pg (ref 26.0–34.0)
MCHC: 33.8 g/dL (ref 30.0–36.0)
MCV: 91.8 fL (ref 78.0–100.0)
Platelets: 349 10*3/uL (ref 150–400)
RBC: 3.8 MIL/uL — ABNORMAL LOW (ref 4.22–5.81)
RDW: 13.3 % (ref 11.5–15.5)
WBC: 19.7 10*3/uL — ABNORMAL HIGH (ref 4.0–10.5)

## 2015-09-29 LAB — CULTURE, BLOOD (SINGLE): Culture: NO GROWTH

## 2015-09-29 MED ORDER — GUAIFENESIN ER 600 MG PO TB12: 600.0000 mg | ORAL_TABLET | Freq: Two times a day (BID) | ORAL | Status: DC

## 2015-09-29 MED ORDER — SACCHAROMYCES BOULARDII 250 MG PO CAPS: 250.0000 mg | ORAL_CAPSULE | Freq: Two times a day (BID) | ORAL | Status: AC

## 2015-09-29 MED ORDER — IPRATROPIUM-ALBUTEROL 18-103 MCG/ACT IN AERO: 2.0000 | INHALATION_SPRAY | Freq: Four times a day (QID) | RESPIRATORY_TRACT | Status: DC

## 2015-09-29 MED ORDER — ENSURE ENLIVE PO LIQD
237.0000 mL | Freq: Two times a day (BID) | ORAL | Status: DC
  Administered 2015-09-29: 237 mL via ORAL

## 2015-09-29 MED ORDER — AMOXICILLIN-POT CLAVULANATE 500-125 MG PO TABS: 1.0000 | ORAL_TABLET | Freq: Three times a day (TID) | ORAL | Status: AC

## 2015-09-29 MED ORDER — GUAIFENESIN ER 600 MG PO TB12
600.0000 mg | ORAL_TABLET | Freq: Two times a day (BID) | ORAL | Status: DC
  Administered 2015-09-29 – 2015-10-03 (×8): 600 mg via ORAL
  Filled 2015-09-29 (×9): qty 1

## 2015-09-29 MED ORDER — PREDNISONE 10 MG PO TABS: ORAL_TABLET | ORAL | Status: DC

## 2015-09-29 MED ORDER — RESOURCE THICKENUP CLEAR PO POWD: 1.0000 g | ORAL | Status: DC | PRN

## 2015-09-29 NOTE — Progress Notes (Signed)
[facility]  Consulted for ambulatory hypoxemia Has aspiration pneumonia based on chart review SLP eval showed dysphagia Has not had a CXR since 1/3  Patient did not want to talk to me tonight.  The most likely scenario here is that he has had some hypoxemia from ongoing aspiration.  However, he needs a repeat CXR.  Will order a repeat CXR.  Please call us if we can still be of assistance if the CXR is not enlightening.   Heber CarolinaBrent Jakob Kimberlin, MD [facility] Pager: [phone] Cell: [phone] After 3pm or if no response, call [phone]

## 2015-09-29 NOTE — Clinical Social Work Note (Addendum)
CSW received phone call from physician that patient is not medically ready for discharge today, CSW contacted [facility] and insurance company to give them an update.  CSW received authorization number from [employer] it is [id number], which can be used once patient is medically ready for discharge.  CSW to continue to follow patient's progress.  Ervin KnackEric R. Jaspreet Bodner, MSW, LCSWA [phone] 09/29/2015 1:32 PM

## 2015-09-29 NOTE — Progress Notes (Signed)
SATURATION QUALIFICATIONS: (This note is used to comply with regulatory documentation for home oxygen)  Patient Saturations on Room Air at Rest = 96%  Patient Saturations on Room Air while Ambulating = 79%  Patient Saturations on 3 Liters of oxygen while Ambulating = 82%  Please briefly explain why patient needs home oxygen:

## 2015-09-29 NOTE — Progress Notes (Signed)
  Echocardiogram 2D Echocardiogram has been performed.  Arvil ChacoFoster, Daizee Firmin 09/29/2015, 1:46 PM

## 2015-09-29 NOTE — Progress Notes (Signed)
[facility] Progress Note  Patient: Eric Berger   PCP: Leo Grosser, MD DOB: 22-Oct-1925   DOA: 09/23/2015   DOS: 09/29/2015   Date of Service: the patient was seen and examined on 09/29/2015  Subjective: Patient's breathing at rest is significantly improved. On exertion patient becomes dyspneic as well as starts having wheezing. Nutrition: Able to tolerate oral diet and working with speech therapy Activity: Working with physical therapy Last BM: 09/29/2015  Assessment and Plan: 1. Pneumonia sepsis with acute hypoxic respiratory failure, suspected aspiration pneumonia. Steroid induced hyperglycemia as well as leukocytosis.  Continue Augmentin and oral prednisone. Legionella streptococcus negative, flu PCR negative, coagulase-negative Staphylococcus aureus in blood culture contaminant. continue duo nebs. On rest oxygenation is improved on room air, on exertion patient drops to 80% within few feet, without any improvement despite 4 L of oxygen. Will consult pulmonary for further input. Check echocardiogram to identify the etiology. Monitor in the hospital until improvement in oxygenation on exertion up to 4 L.  2. Acute encephalopathy. Most likely delirium secondary to infection. Improving and started at his baseline.  3.  Acute kidney injury. Resolved. We'll continue close monitoring.  4. History of CVA. Dyslipidemia. Continuing aspirin and simvastatin.  5. Dysphagia. Appreciate input from speech therapy. Patient is on honey thick liquids. Patient is at recurrent admission regarding aspiration pneumonia.  DVT Prophylaxis: subcutaneous Heparin Nutrition: Honey thick liquids Advance goals of care discussion: DO NOT RESUSCITATE  Brief Summary of Hospitalization:  HPI: As per the H and P dictated on admission, "Eric Berger is a 82 y.o. male with history of hypertension, stroke, hyperlipidemia was brought to the ER after patient had sudden  onset of multiple episodes of nausea vomiting. Patient's wife states that prior to that patient has been having productive cough for last for 5 days. In the ER patient was found to be febrile. Chest x-ray shows possible infiltrates concerning for pneumonia. Patient also appears confused but follows commands. Abdomen appears benign. Blood cultures were obtained along with urine cultures and admitted for fever most likely from pneumonia. Patient's wife stated that he has become confused after having the fever. Has not had any recent sick contacts or recent travel." Daily update, Procedures: Admitted on 09/23/2015, modified barium swallow on 09/26/2015 Consultants: None Antibiotics: Anti-infectives    Start     Dose/Rate Route Frequency Ordered Stop   09/29/15 0000  amoxicillin-clavulanate (AUGMENTIN) 500-125 MG tablet     1 tablet Oral Every 8 hours 09/29/15 1037 10/02/15 2359   09/28/15 1400  amoxicillin-clavulanate (AUGMENTIN) 500-125 MG per tablet 500 mg     1 tablet Oral 3 times per day 09/28/15 1013     09/24/15 1900  Ampicillin-Sulbactam (UNASYN) 3 g in sodium chloride 0.9 % 100 mL IVPB  Status:  Discontinued     3 g 100 mL/hr over 60 Minutes Intravenous Every 8 hours 09/24/15 1813 09/28/15 1013   09/24/15 1800  cefTRIAXone (ROCEPHIN) 1 g in dextrose 5 % 50 mL IVPB  Status:  Discontinued     1 g 100 mL/hr over 30 Minutes Intravenous Every 24 hours 09/23/15 2144 09/24/15 1809   09/24/15 1800  azithromycin (ZITHROMAX) 500 mg in dextrose 5 % 250 mL IVPB  Status:  Discontinued     500 mg 250 mL/hr over 60 Minutes Intravenous Every 24 hours 09/23/15 2144 09/28/15 1013   09/24/15 1500  vancomycin (VANCOCIN) IVPB 1000 mg/200 mL premix  Status:  Discontinued     1,000 mg 200  mL/hr over 60 Minutes Intravenous Every 24 hours 09/24/15 1408 09/26/15 0918   09/23/15 1700  cefTRIAXone (ROCEPHIN) 1 g in dextrose 5 % 50 mL IVPB     1 g 100 mL/hr over 30 Minutes Intravenous  Once 09/23/15 1647 09/23/15  1901   09/23/15 1700  azithromycin (ZITHROMAX) 500 mg in dextrose 5 % 250 mL IVPB     500 mg 250 mL/hr over 60 Minutes Intravenous  Once 09/23/15 1647 09/23/15 1814      Family Communication: no family was present at bedside, at the time of interview.   Disposition:  Expected discharge date: 10/01/2015 Barriers to safe discharge: Improvement in oxygenation on exertion   Intake/Output Summary (Last 24 hours) at 09/29/15 1627 Last data filed at 09/29/15 1555  Gross per 24 hour  Intake    600 ml  Output   1275 ml  Net   -675 ml   Filed Weights   09/26/15 2026 09/27/15 2013 09/28/15 2100  Weight: 77 kg (169 lb 12.1 oz) 76.6 kg (168 lb 14 oz) 78 kg (171 lb 15.3 oz)    Objective: Physical Exam: Filed Vitals:   09/29/15 0935 09/29/15 0938 09/29/15 1159 09/29/15 1206  BP: 150/70     Pulse:      Temp: 97.6 F (36.4 C)     TempSrc: Oral     Resp: 18     Height:      Weight:      SpO2: 96% 93% 96% 99%    General: Appear in mild distress, no Rash; Oral Mucosa moist. Cardiovascular: S1 and S2 Present, no Murmur, no JVD Respiratory: Bilateral Air entry present and basal Crackles, improved wheezes Abdomen: Bowel Sound present, Soft and no tenderness Extremities: no Pedal edema, no calf tenderness  Data Reviewed: CBC:  Recent Labs Lab 09/23/15 1555 09/24/15 0458 09/25/15 0520 09/26/15 0515 09/27/15 0416 09/28/15 0542 09/29/15 0502  WBC 10.4 9.9 7.8 7.2 6.9 18.7* 19.7*  NEUTROABS 8.4* 6.7  --   --   --   --   --   HGB 15.2 13.2 11.0* 11.0* 11.2* 11.8* 11.8*  HCT 43.7 39.1 32.2* 32.5* 32.9* 34.7* 34.9*  MCV 93.0 93.8 92.8 91.8 92.7 91.8 91.8  PLT 228 193 170 190 215 300 349   Basic Metabolic Panel:  Recent Labs Lab 09/25/15 0520 09/26/15 0515 09/27/15 0416 09/28/15 0542 09/29/15 0502  NA 137 139 143 145 144  K 3.7 3.6 4.1 3.3* 3.3*  CL 109 110 111 110 109  CO2 22 21* 23 23 24   GLUCOSE 96 108* 161* 149* 138*  BUN 39* 30* 24* 29* 30*  CREATININE 1.66* 1.36*  0.94 1.21 1.11  CALCIUM 7.5* 7.9* 8.5* 8.6* 8.7*   Liver Function Tests:  Recent Labs Lab 09/23/15 1555 09/24/15 0458 09/27/15 0416  AST 66* 65* 48*  ALT 32 34 54  ALKPHOS 50 39 49  BILITOT 1.2 0.8 0.7  PROT 7.7 6.3* 6.5  ALBUMIN 4.1 3.1* 2.6*   No results for input(s): LIPASE, AMYLASE in the last 168 hours. No results for input(s): AMMONIA in the last 168 hours.  Cardiac Enzymes:  Recent Labs Lab 09/23/15 1555 09/24/15 0458 09/27/15 0416  CKTOTAL  --  1516* 227  TROPONINI 0.03  --   --     BNP (last 3 results) No results for input(s): BNP in the last 8760 hours.  CBG: No results for input(s): GLUCAP in the last 168 hours.  Recent Results (from the past 240 hour(s))  Urine culture     Status: None   Collection Time: 09/23/15  3:50 PM  Result Value Ref Range Status   Specimen Description URINE, RANDOM  Final   Special Requests NONE  Final   Culture MULTIPLE SPECIES PRESENT, SUGGEST RECOLLECTION  Final   Report Status 09/25/2015 FINAL  Final  Blood culture (routine x 2)     Status: None   Collection Time: 09/23/15  3:55 PM  Result Value Ref Range Status   Specimen Description BLOOD RIGHT ANTECUBITAL  Final   Special Requests BOTTLES DRAWN AEROBIC AND ANAEROBIC 5CC  Final   Culture  Setup Time   Final    GRAM POSITIVE COCCI IN CLUSTERS AEROBIC BOTTLE ONLY CRITICAL RESULT CALLED TO, READ BACK BY AND VERIFIED WITH: J ADAMS,RN AT 1237 09/24/15 BY L BENFIELD    Culture   Final    STAPHYLOCOCCUS SPECIES (COAGULASE NEGATIVE) THE SIGNIFICANCE OF ISOLATING THIS ORGANISM FROM A SINGLE SET OF BLOOD CULTURES WHEN MULTIPLE SETS ARE DRAWN IS UNCERTAIN. PLEASE NOTIFY THE MICROBIOLOGY DEPARTMENT WITHIN ONE WEEK IF SPECIATION AND SENSITIVITIES ARE REQUIRED.    Report Status 09/25/2015 FINAL  Final  Blood culture (routine x 2)     Status: None   Collection Time: 09/23/15  4:00 PM  Result Value Ref Range Status   Specimen Description BLOOD RIGHT HAND  Final   Special Requests  IN PEDIATRIC BOTTLE 3CC  Final   Culture NO GROWTH 5 DAYS  Final   Report Status 09/28/2015 FINAL  Final  Culture, blood (single)     Status: None   Collection Time: 09/24/15  2:04 PM  Result Value Ref Range Status   Specimen Description BLOOD RIGHT ANTECUBITAL  Final   Special Requests BOTTLES DRAWN AEROBIC ONLY 10CCS  Final   Culture NO GROWTH 5 DAYS  Final   Report Status 09/29/2015 FINAL  Final  Culture, blood (single)     Status: None (Preliminary result)   Collection Time: 09/25/15  5:20 AM  Result Value Ref Range Status   Specimen Description BLOOD RIGHT ANTECUBITAL  Final   Special Requests IN PEDIATRIC BOTTLE 3CC  Final   Culture NO GROWTH 3 DAYS  Final   Report Status PENDING  Incomplete     Studies: No results found.   Scheduled Meds: . amoxicillin-clavulanate  1 tablet Oral 3 times per day  . aspirin  325 mg Oral Daily  . enoxaparin (LOVENOX) injection  40 mg Subcutaneous QHS  . guaiFENesin  600 mg Oral BID  . ipratropium-albuterol  3 mL Nebulization QID  . losartan  50 mg Oral Daily  . pantoprazole  40 mg Oral Daily  . predniSONE  50 mg Oral Q breakfast  . saccharomyces boulardii  250 mg Oral BID  . simvastatin  40 mg Oral q1800   Continuous Infusions:  PRN Meds: acetaminophen **OR** acetaminophen, ondansetron **OR** ondansetron (ZOFRAN) IV, RESOURCE THICKENUP CLEAR  Time spent: 30 minutes  Author: Lynden Oxford, MD [facility] Pager: [phone] 09/29/2015 4:27 PM  If 7PM-7AM, please contact night-coverage at [email or web address], password [facility]

## 2015-09-29 NOTE — Discharge Summary (Addendum)
[facility] Discharge Summary   Patient: Eric Berger:[id number]   PCP: Leo Grosser, MD DOB: December 19, 1925   Date of admission: 09/23/2015   Date of discharge: 09/29/2015    Discharge Diagnoses:  Principal Problem:   Pneumonia Active Problems:   BRONCHITIS, ACUTE   HTN (hypertension)   CAP (community acquired pneumonia)   Acute encephalopathy   ARF (acute renal failure) (HCC)   Nausea with vomiting   Sepsis (HCC)   Recommendations for Outpatient Follow-up:  1. Follow-up with PCP in one week  2. Discharge into [facility]  Diet recommendation:  Diet recommendations: Dysphagia 3 (mechanical soft);Nectar-thick liquid (pt agreed to thick liquids) Liquids provided via: Cup;No straw Medication Administration: Whole meds with puree Supervision: Patient able to self feed;Full supervision/cueing for compensatory strategies Compensations: Slow rate;Small sips/bites;Multiple dry swallows after each bite/sip;Clear throat intermittently Postural Changes and/or Swallow Maneuvers: Seated upright 90 degrees  Activity: The patient is advised to gradually reintroduce usual activities.  Discharge Condition: good  History of present illness: As per the H and P dictated on admission, "Eric Berger is a 82 y.o. male with history of hypertension, stroke, hyperlipidemia was brought to the ER after patient had sudden onset of multiple episodes of nausea vomiting. Patient's wife states that prior to that patient has been having productive cough for last for 5 days. In the ER patient was found to be febrile. Chest x-ray shows possible infiltrates concerning for pneumonia. Patient also appears confused but follows commands. Abdomen appears benign. Blood cultures were obtained along with urine cultures and admitted for fever most likely from pneumonia. Patient's wife stated that he has become confused after having the fever. Has not had any recent sick contacts  or recent travel."  Hospital Course:  Summary of his active problems in the hospital is as following. 1. Pneumonia sepsis with acute hypoxic respiratory failure, suspected aspiration pneumonia.  Legionella urine antigen and streptococcus urine antigen negative, flu PCR negative, coagulase-negative Staphylococcus aureus in blood culture is likely contaminant.  Patient was found to be having dysphagia most likely from his prior CVA. He also had diffuse wheezing consistent with possible acute bronchitis. Patient will be discharged on Augmentin to complete 10 day antibiotic therapy. Slow taper of prednisone as well as short-term course of Combivent for acute bronchitis. Recommend to continue pulmonary toilet with flutter device and Mucinex on discharge. On the day of the discharge the patient was weaned to room air. But may need oxygen on exertion.  2. Acute encephalopathy. Most likely delirium secondary to infection. Improving and remained stable, patient was at his baseline as per the family.  3. Acute kidney injury. Admission creatinine 1.5, discharge creatinine 1.1 We'll continue close monitoring.  4. History of CVA. Dyslipidemia. Continuing aspirin and simvastatin.  5. Dysphagia. Appreciate input from speech therapy. Patient is on nectar thick liquids. Patient is at risk for recurrent aspiration pneumonia.   All other chronic medical condition were stable during the hospitalization.  Patient was seen by physical therapy, who recommended SNF, which was arranged by Child psychotherapist and case Production designer, theatre/television/film. On the day of the discharge the patient's oxygenation remained stable and wheezing improved, and no other acute medical condition were reported by patient. the patient was felt safe to be discharge at [facility].  Procedures and Results:   Modified barium swallow  Consultations:  Speech therapy  Discharge Exam: [facility] Weights   09/26/15 2026 09/27/15 2013 09/28/15 2100  Weight:  77 kg (169 lb 12.1 oz) 76.6  kg (168 lb 14 oz) 78 kg (171 lb 15.3 oz)   Filed Vitals:   09/29/15 0532 09/29/15 0935  BP: 119/75 150/70  Pulse: 90   Temp: 97.3 F (36.3 C) 97.6 F (36.4 C)  Resp: 16 18   General: Appear in no distress, no Rash; Oral Mucosa moist. Cardiovascular: S1 and S2 Present, no Murmur, no JVD Respiratory: Bilateral Air entry present and basal Crackles, occasional wheezes Abdomen: Bowel Sound present, Soft and no tenderness Extremities: no Pedal edema, no calf tenderness Neurology: Grossly no focal neuro deficit.  DISCHARGE MEDICATION: Discharge Instructions    DIET DYS 3    Complete by:  As directed   Fluid consistency:  Nectar Thick     Discharge instructions    Complete by:  As directed   It is important that you read following instructions as well as go over your medication list with RN to help you understand your care after this hospitalization.  Discharge Instructions: Diet recommendations: Dysphagia 3 (mechanical soft);Nectar-thick liquid (pt agreed to thick liquids) Liquids provided via: Cup;No straw Medication Administration: Whole meds with puree Supervision: Patient able to self feed;Full supervision/cueing for compensatory strategies Compensations: Slow rate;Small sips/bites;Multiple dry swallows after each bite/sip;Clear throat intermittently Postural Changes and/or Swallow Maneuvers: Seated upright 90 degrees  Follow up with PCP in 1 week for pneumonia.   Please request your primary care physician to go over all Hospital Tests and Procedure/Radiological results at the follow up,  Please get all Hospital records sent to your PCP by signing hospital release before you go home.  You were cared for by a hospitalist during your hospital stay. If you have any questions about your discharge medications or the care you received while you were in the hospital after you are discharged, you can call the unit and ask to speak with the hospitalist on call  if the hospitalist that took care of you is not available.  Once you are discharged, your primary care physician will handle any further medical issues. Please note that NO REFILLS for any discharge medications will be authorized once you are discharged, as it is imperative that you return to your primary care physician (or establish a relationship with a primary care physician if you do not have one) for your aftercare needs so that they can reassess your need for medications and monitor your lab values. You Must read complete instructions/literature along with all the possible adverse reactions/side effects for all the Medicines you take and that have been prescribed to you. Take any new Medicines after you have completely understood and accept all the possible adverse reactions/side effects. Wear Seat belts while driving. If you have smoked or chewed Tobacco in the last 2 yrs please stop smoking and/or stop any Recreational drug use.     Flutter valve    Complete by:  As directed      Increase activity slowly    Complete by:  As directed           Current Discharge Medication List    START taking these medications   Details  albuterol-ipratropium (COMBIVENT) 18-103 MCG/ACT inhaler Inhale 2 puffs into the lungs 4 (four) times daily. Qty: 1 Inhaler, Refills: 0    amoxicillin-clavulanate (AUGMENTIN) 500-125 MG tablet Take 1 tablet (500 mg total) by mouth every 8 (eight) hours. Qty: 7 tablet, Refills: 0    guaiFENesin (MUCINEX) 600 MG 12 hr tablet Take 1 tablet (600 mg total) by mouth 2 (two) times daily. Qty: 30  tablet, Refills: 0    Maltodextrin-Xanthan Gum (RESOURCE THICKENUP CLEAR) POWD Take 1 g by mouth as needed. Qty: 1 Can, Refills: 0    predniSONE (DELTASONE) 10 MG tablet Take 50mg  daily for 3days,Take 40mg  daily for 3days,Take 30mg  daily for 3days,Take 20mg  daily for 3days,Take 10mg  daily for 3days,then stop Qty: 45 tablet, Refills: 0    saccharomyces boulardii (FLORASTOR)  250 MG capsule Take 1 capsule (250 mg total) by mouth 2 (two) times daily. Qty: 7 capsule, Refills: 0      CONTINUE these medications which have NOT CHANGED   Details  aspirin 325 MG tablet Take 325 mg by mouth daily.    cholecalciferol (VITAMIN D) 1000 UNITS tablet Take 1,000 Units by mouth daily.    Cinnamon 500 MG capsule Take 500 mg by mouth daily.    GARLIC PO Take 1 tablet by mouth daily.    glucosamine-chondroitin 500-400 MG tablet Take 1 tablet by mouth 3 (three) times daily.    lansoprazole (PREVACID) 15 MG capsule Take 15 mg by mouth daily at 12 noon.    losartan (COZAAR) 50 MG tablet Take 1 tablet (50 mg total) by mouth daily. Qty: 30 tablet, Refills: 5    OVER THE COUNTER MEDICATION Mixes vinegar and grape juice in small glass and drinks at night-for metabolism    saw palmetto 500 MG capsule Take 500 mg by mouth daily.    simvastatin (ZOCOR) 40 MG tablet TAKE ONE TABLET BY MOUTH ONCE DAILY AT 6PM Qty: 90 tablet, Refills: 0      STOP taking these medications     Dextromethorphan Polistirex (DELSYM PO)        No Known Allergies Follow-up Information    [facility]. Schedule an appointment as soon as possible for a visit in 1 week.   Specialty:  Family Medicine   Contact information:   [address] [place] [id number] [phone]       The results of significant diagnostics from this hospitalization (including imaging, microbiology, ancillary and laboratory) are listed below for reference.    Significant Diagnostic Studies: Ct Abdomen Pelvis Wo Contrast  09/24/2015  CLINICAL DATA:  Fever nausea vomiting since yesterday. EXAM: CT ABDOMEN AND PELVIS WITHOUT CONTRAST TECHNIQUE: Multidetector CT imaging of the abdomen and pelvis was performed following the standard protocol without IV contrast. COMPARISON:  06/08/2012 FINDINGS: Lower chest: 4 mm posterior right middle lobe pulmonary nodule noted. Right lower lobe  collapse/consolidation with mild atelectasis or infiltrate in the left base. Hepatobiliary: No focal abnormality in the liver on this study without intravenous contrast. No evidence of hepatomegaly. Small rim calcified structures along the posterior liver capsular stable. Patient is status post cholecystectomy. No intrahepatic or extrahepatic biliary dilation. Pancreas: No focal mass lesion. No dilatation of the main duct. No intraparenchymal cyst. No peripancreatic edema. Spleen: No splenomegaly. No focal mass lesion. Adrenals/Urinary Tract: No adrenal nodule or mass. Right kidney is unremarkable. There is evidence of central sinus cysts in the left kidney, as seen previously. Fine calcification along the wall of 1 of the cysts is also stable. No evidence for hydroureter. The urinary bladder appears normal for the degree of distention. Stomach/Bowel: Stomach is nondistended. No gastric wall thickening. No evidence of outlet obstruction. Duodenum is normally positioned as is the ligament of Treitz. No small bowel wall thickening. No small bowel dilatation. The terminal ileum is normal. The appendix is normal. No gross colonic mass. No colonic wall thickening. No  substantial diverticular change. Vascular/Lymphatic: There is abdominal aortic atherosclerosis without aneurysm. There is no gastrohepatic or hepatoduodenal ligament lymphadenopathy. No intraperitoneal or retroperitoneal lymphadenopy. No pelvic sidewall lymphadenopathy. Reproductive: Prostate gland appears enlarged. Other: No intraperitoneal free fluid. Musculoskeletal: Gas loculation the subcutaneous fat of the lower left anterior abdominal wall presumably represent injection site. Left inguinal hernia contains prominent venous anatomy and varicocele could have this appearance. Bone windows reveal no worrisome lytic or sclerotic osseous lesions. IMPRESSION: 1. No CT findings to explain the patient's history of nausea and vomiting with fever. 2. Bilateral  lower lobe collapse/consolidation suspicious for pneumonia. 3. 4 mm right middle lobe pulmonary nodule. If the patient is at high risk for bronchogenic carcinoma, follow-up chest CT at 1 year is recommended. If the patient is at low risk, no follow-up is needed. This recommendation follows the consensus statement: Guidelines for Management of Small Pulmonary Nodules Detected on CT Scans: A Statement from the Fleischner Society as published in Radiology 2005; 237:395-400. 4. Central sinus cysts in the left kidney. 5. Prostatomegaly. 6. Abdominal aortic atherosclerosis. Electronically Signed   By: Kennith Center M.D.   On: 09/24/2015 11:50   Dg Chest 2 View  09/23/2015  CLINICAL DATA:  Patient with cough, fever and weakness for 3 days. EXAM: CHEST  2 VIEW COMPARISON:  Chest radiograph 07/18/2009 FINDINGS: Patient is rotated. Stable enlarged cardiac and mediastinal contours. Dependent heterogeneous opacities within the lung bases bilaterally. No pleural effusion or pneumothorax. Thoracic spine degenerative changes. IMPRESSION: Heterogeneous opacities within the lung bases bilaterally favored to represent atelectasis. Infection not excluded. Electronically Signed   By: Annia Belt M.D.   On: 09/23/2015 16:25   Ct Head Wo Contrast  09/24/2015  CLINICAL DATA:  Acute onset of productive cough and chills. Nausea and vomiting. Mild shortness of breath. Initial encounter. EXAM: CT HEAD WITHOUT CONTRAST TECHNIQUE: Contiguous axial images were obtained from the base of the skull through the vertex without intravenous contrast. COMPARISON:  CT of the head performed 07/18/2009, and MRI/MRA of the head performed 07/19/2009 FINDINGS: There is no evidence of acute infarction, mass lesion, or intra- or extra-axial hemorrhage on CT. Prominence of ventricles and sulci reflects mild cortical volume loss. Scattered periventricular and subcortical white matter change likely reflects small vessel ischemic microangiopathy. Chronic  lacunar infarcts are noted at the basal ganglia bilaterally. The brainstem and fourth ventricle are within normal limits. The cerebral hemispheres demonstrate grossly normal gray-white differentiation. No mass effect or midline shift is seen. There is no evidence of fracture; visualized osseous structures are unremarkable in appearance. The visualized portions of the orbits are within normal limits. The paranasal sinuses and mastoid air cells are well-aerated. No significant soft tissue abnormalities are seen. IMPRESSION: 1. No acute intracranial pathology seen on CT. 2. Mild cortical volume loss and scattered small vessel microangiopathy. 3. Chronic lacunar infarcts at the basal ganglia bilaterally. Electronically Signed   By: Roanna Raider M.D.   On: 09/24/2015 00:10   Dg Chest Port 1 View  09/25/2015  CLINICAL DATA:  Wheezing and shortness of breath. Patient reports a diagnosis of pneumonia. Also have a syncopal episode 3 days ago. EXAM: PORTABLE CHEST 1 VIEW COMPARISON:  09/23/2015 FINDINGS: Cardiac silhouette is normal in size. No mediastinal or hilar masses. Interstitial opacities at the lung bases have increased most evident on the right. There is milder perihilar interstitial prominence, greater on the right. No convincing pleural effusion.  No pneumothorax. Bony thorax is demineralized but grossly intact. IMPRESSION: 1. Worsening lung  aeration when compared the prior exam, and when allowing for differences in technique. There is an increase in interstitial opacities at the lung bases, right greater than left. Findings may reflect acute bronchitis/bronchopneumonia, atelectasis or a combination. Electronically Signed   By: Amie Portland M.D.   On: 09/25/2015 13:50   Dg Swallowing Func-speech Pathology  09/26/2015  Objective Swallowing Evaluation:   MBS Patient Details Name: TRAPPER MEECH MRN: [id number] Date of Birth: [birth year] Today's Date: 09/26/2015 Time: SLP Start Time (ACUTE ONLY): 1037-SLP Stop  Time (ACUTE ONLY): 1055 SLP Time Calculation (min) (ACUTE ONLY): 18 min Past Medical History: @PMH @ Past Surgical History: Past Surgical History Procedure Laterality Date . Cholecystectomy   . Hernia repair   . Eye surgery   HPI: 82 y.o. male with history of hypertension, stroke, hyperlipidemia, GERD and hernia brought to the ER after patient had sudden onset of multiple episodes of nausea vomiting and productive cough for last for 5 days. Per MD note pt found to be febrile and chest x-ray shows possible infiltrates concerning for pneumonia.CT abdomen " not findings to explain nausea and vomiting". Pt reports using thick liquids for "awhile after stroke and I hated it". MBS recommended to fully assess swallow function. No Data Recorded Assessment / Plan / Recommendation CHL IP CLINICAL IMPRESSIONS 09/26/2015 Therapy Diagnosis Moderate oral phase dysphagia;Severe pharyngeal phase dysphagia;Moderate pharyngeal phase dysphagia Clinical Impression Moderate oral and mod-severe pharyngeal dysphagia marked by sensorimotor deficits leading to laryngeal penetration on top of vocal cords (silent) during initial swallow and second swallow in attempts to clear mod-max pyriform sinus residue. Chin tuck posture not attempted due to severity of pyriform sinus residue and increased risk with anterior flex neck position. Decreased sensation and is unaware of pharyngeal residue. Honey thick liquids are recommended however pt has stated on 2 separate occasions that he will NOT drink thick liquids again. SLP explained chronic aspiration risk and he responded "that's just a risk I'll have to take." SLP will leave pt on thin liquids and Dys 3 texture and attempt to inform and educate wife. Concerned with future hospitalizations with pna.   Impact on safety and function Severe aspiration risk   CHL IP TREATMENT RECOMMENDATION 09/26/2015 Treatment Recommendations Therapy as outlined in treatment plan below   Prognosis 09/26/2015 Prognosis for  Safe Diet Advancement Fair Barriers to Reach Goals Severity of deficits Barriers/Prognosis Comment -- CHL IP DIET RECOMMENDATION 09/26/2015 SLP Diet Recommendations Thin liquid;Dysphagia 3 (Mech soft) solids Liquid Administration via Cup;No straw Medication Administration Whole meds with puree Compensations Slow rate;Small sips/bites;Multiple dry swallows after each bite/sip;Clear throat intermittently Postural Changes Remain semi-upright after after feeds/meals (Comment)   CHL IP OTHER RECOMMENDATIONS 09/26/2015 Recommended Consults -- Oral Care Recommendations Oral care BID Other Recommendations --   CHL IP FOLLOW UP RECOMMENDATIONS 09/26/2015 Follow up Recommendations None   CHL IP FREQUENCY AND DURATION 09/26/2015 Speech Therapy Frequency (ACUTE ONLY) min 2x/week Treatment Duration 2 weeks      CHL IP ORAL PHASE 09/26/2015 Oral Phase Impaired Oral - Pudding Teaspoon -- Oral - Pudding Cup -- Oral - Honey Teaspoon -- Oral - Honey Cup Delayed oral transit Oral - Nectar Teaspoon -- Oral - Nectar Cup Delayed oral transit Oral - Nectar Straw -- Oral - Thin Teaspoon -- Oral - Thin Cup -- Oral - Thin Straw -- Oral - Puree Delayed oral transit;Decreased bolus cohesion Oral - Mech Soft -- Oral - Regular NT Oral - Multi-Consistency -- Oral - Pill -- Oral Phase - Comment --  CHL IP PHARYNGEAL PHASE 09/26/2015 Pharyngeal Phase Impaired Pharyngeal- Pudding Teaspoon -- Pharyngeal -- Pharyngeal- Pudding Cup -- Pharyngeal -- Pharyngeal- Honey Teaspoon -- Pharyngeal -- Pharyngeal- Honey Cup Penetration/Aspiration during swallow;Pharyngeal residue - valleculae;Pharyngeal residue - pyriform;Reduced tongue base retraction;Reduced laryngeal elevation Pharyngeal Material enters airway, remains ABOVE vocal cords and not ejected out Pharyngeal- Nectar Teaspoon -- Pharyngeal -- Pharyngeal- Nectar Cup Delayed swallow initiation-vallecula;Pharyngeal residue - valleculae;Pharyngeal residue - pyriform;Reduced tongue base retraction;Reduced laryngeal  elevation Pharyngeal -- Pharyngeal- Nectar Straw -- Pharyngeal -- Pharyngeal- Thin Teaspoon -- Pharyngeal -- Pharyngeal- Thin Cup Delayed swallow initiation-pyriform sinuses;Pharyngeal residue - valleculae;Pharyngeal residue - pyriform;Reduced tongue base retraction;Reduced laryngeal elevation;Penetration/Aspiration during swallow Pharyngeal Material enters airway, CONTACTS cords and not ejected out Pharyngeal- Thin Straw Penetration/Aspiration during swallow;Reduced laryngeal elevation;Reduced tongue base retraction;Pharyngeal residue - valleculae;Pharyngeal residue - pyriform Pharyngeal Material enters airway, remains ABOVE vocal cords and not ejected out Pharyngeal- Puree -- Pharyngeal -- Pharyngeal- Mechanical Soft -- Pharyngeal -- Pharyngeal- Regular -- Pharyngeal -- Pharyngeal- Multi-consistency -- Pharyngeal -- Pharyngeal- Pill -- Pharyngeal -- Pharyngeal Comment --  CHL IP CERVICAL ESOPHAGEAL PHASE 09/26/2015 Cervical Esophageal Phase (No Data) Pudding Teaspoon -- Pudding Cup -- Honey Teaspoon -- Honey Cup -- Nectar Teaspoon -- Nectar Cup -- Nectar Straw -- Thin Teaspoon -- Thin Cup -- Thin Straw -- Puree -- Mechanical Soft -- Regular -- Multi-consistency -- Pill -- Cervical Esophageal Comment -- Royce Macadamia 09/26/2015, 1:32 PM Breck Coons Litaker M.Ed [employer] [phone]               Microbiology: Recent Results (from the past 240 hour(s))  Urine culture     Status: None   Collection Time: 09/23/15  3:50 PM  Result Value Ref Range Status   Specimen Description URINE, RANDOM  Final   Special Requests NONE  Final   Culture MULTIPLE SPECIES PRESENT, SUGGEST RECOLLECTION  Final   Report Status 09/25/2015 FINAL  Final  Blood culture (routine x 2)     Status: None   Collection Time: 09/23/15  3:55 PM  Result Value Ref Range Status   Specimen Description BLOOD RIGHT ANTECUBITAL  Final   Special Requests BOTTLES DRAWN AEROBIC AND ANAEROBIC 5CC  Final   Culture  Setup Time   Final    GRAM  POSITIVE COCCI IN CLUSTERS AEROBIC BOTTLE ONLY CRITICAL RESULT CALLED TO, READ BACK BY AND VERIFIED WITH: J ADAMS,RN AT 1237 09/24/15 BY L BENFIELD    Culture   Final    STAPHYLOCOCCUS SPECIES (COAGULASE NEGATIVE) THE SIGNIFICANCE OF ISOLATING THIS ORGANISM FROM A SINGLE SET OF BLOOD CULTURES WHEN MULTIPLE SETS ARE DRAWN IS UNCERTAIN. PLEASE NOTIFY THE MICROBIOLOGY DEPARTMENT WITHIN ONE WEEK IF SPECIATION AND SENSITIVITIES ARE REQUIRED.    Report Status 09/25/2015 FINAL  Final  Blood culture (routine x 2)     Status: None   Collection Time: 09/23/15  4:00 PM  Result Value Ref Range Status   Specimen Description BLOOD RIGHT HAND  Final   Special Requests IN PEDIATRIC BOTTLE 3CC  Final   Culture NO GROWTH 5 DAYS  Final   Report Status 09/28/2015 FINAL  Final  Culture, blood (single)     Status: None   Collection Time: 09/24/15  2:04 PM  Result Value Ref Range Status   Specimen Description BLOOD RIGHT ANTECUBITAL  Final   Special Requests BOTTLES DRAWN AEROBIC ONLY 10CCS  Final   Culture NO GROWTH 5 DAYS  Final   Report Status 09/29/2015 FINAL  Final  Culture, blood (single)  Status: None (Preliminary result)   Collection Time: 09/25/15  5:20 AM  Result Value Ref Range Status   Specimen Description BLOOD RIGHT ANTECUBITAL  Final   Special Requests IN PEDIATRIC BOTTLE 3CC  Final   Culture NO GROWTH 3 DAYS  Final   Report Status PENDING  Incomplete     Labs: CBC:  Recent Labs Lab 09/23/15 1555 09/24/15 0458 09/25/15 0520 09/26/15 0515 09/27/15 0416 09/28/15 0542 09/29/15 0502  WBC 10.4 9.9 7.8 7.2 6.9 18.7* 19.7*  NEUTROABS 8.4* 6.7  --   --   --   --   --   HGB 15.2 13.2 11.0* 11.0* 11.2* 11.8* 11.8*  HCT 43.7 39.1 32.2* 32.5* 32.9* 34.7* 34.9*  MCV 93.0 93.8 92.8 91.8 92.7 91.8 91.8  PLT 228 193 170 190 215 300 349   Basic Metabolic Panel:  Recent Labs Lab 09/25/15 0520 09/26/15 0515 09/27/15 0416 09/28/15 0542 09/29/15 0502  NA 137 139 143 145 144  K 3.7  3.6 4.1 3.3* 3.3*  CL 109 110 111 110 109  CO2 22 21* 23 23 24   GLUCOSE 96 108* 161* 149* 138*  BUN 39* 30* 24* 29* 30*  CREATININE 1.66* 1.36* 0.94 1.21 1.11  CALCIUM 7.5* 7.9* 8.5* 8.6* 8.7*   Liver Function Tests:  Recent Labs Lab 09/23/15 1555 09/24/15 0458 09/27/15 0416  AST 66* 65* 48*  ALT 32 34 54  ALKPHOS 50 39 49  BILITOT 1.2 0.8 0.7  PROT 7.7 6.3* 6.5  ALBUMIN 4.1 3.1* 2.6*   Cardiac Enzymes:  Recent Labs Lab 09/23/15 1555 09/24/15 0458 09/27/15 0416  CKTOTAL  --  1516* 227  TROPONINI 0.03  --   --    Time spent: 30 minutes  Signed:  Caylyn Tedeschi  [facility] 09/29/2015, 11:09 AM

## 2015-09-30 ENCOUNTER — Inpatient Hospital Stay (HOSPITAL_COMMUNITY): Payer: Medicare HMO

## 2015-09-30 LAB — COMPREHENSIVE METABOLIC PANEL
ALBUMIN: 3.3 g/dL — AB (ref 3.5–5.0)
ALK PHOS: 47 U/L (ref 38–126)
ALT: 157 U/L — AB (ref 17–63)
ANION GAP: 9 (ref 5–15)
AST: 91 U/L — AB (ref 15–41)
BILIRUBIN TOTAL: 1 mg/dL (ref 0.3–1.2)
BUN: 27 mg/dL — ABNORMAL HIGH (ref 6–20)
CALCIUM: 8.9 mg/dL (ref 8.9–10.3)
CHLORIDE: 108 mmol/L (ref 101–111)
CO2: 28 mmol/L (ref 22–32)
Creatinine, Ser: 1.03 mg/dL (ref 0.61–1.24)
GFR calc Af Amer: >60 mL/min (ref >60)
GFR calc non Af Amer: >60 mL/min (ref >60)
Glucose, Bld: 107 mg/dL — ABNORMAL HIGH (ref 65–99)
Potassium: 3.7 mmol/L (ref 3.5–5.1)
SODIUM: 145 mmol/L (ref 135–145)
Total Protein: 6.7 g/dL (ref 6.5–8.1)

## 2015-09-30 LAB — CBC WITH DIFFERENTIAL/PLATELET
BASOS PCT: 0 %
Basophils Absolute: 0 10*3/uL (ref 0.0–0.1)
EOS ABS: 0 10*3/uL (ref 0.0–0.7)
Eosinophils Relative: 0 %
HEMATOCRIT: 37 % — AB (ref 39.0–52.0)
HEMOGLOBIN: 12.3 g/dL — AB (ref 13.0–17.0)
Lymphocytes Relative: 10 %
Lymphs Abs: 1.8 10*3/uL (ref 0.7–4.0)
MCH: 31.2 pg (ref 26.0–34.0)
MCHC: 33.2 g/dL (ref 30.0–36.0)
MCV: 93.9 fL (ref 78.0–100.0)
Monocytes Absolute: 1.1 10*3/uL — ABNORMAL HIGH (ref 0.1–1.0)
Monocytes Relative: 6 %
NEUTROS ABS: 15.2 10*3/uL — AB (ref 1.7–7.7)
NEUTROS PCT: 84 %
Platelets: 390 10*3/uL (ref 150–400)
RBC: 3.94 MIL/uL — AB (ref 4.22–5.81)
RDW: 13.5 % (ref 11.5–15.5)
WBC: 18.1 10*3/uL — AB (ref 4.0–10.5)

## 2015-09-30 LAB — BLOOD GAS, ARTERIAL
Acid-Base Excess: 0.9 mmol/L (ref 0.0–2.0)
Bicarbonate: 24.3 mEq/L — ABNORMAL HIGH (ref 20.0–24.0)
DRAWN BY: 331001
O2 Content: 2 L/min
O2 SAT: 92 %
PATIENT TEMPERATURE: 97.6
PH ART: 7.471 — AB (ref 7.350–7.450)
TCO2: 25.3 mmol/L (ref 0–100)
pCO2 arterial: 33.4 mmHg — ABNORMAL LOW (ref 35.0–45.0)
pO2, Arterial: 61.6 mmHg — ABNORMAL LOW (ref 80.0–100.0)

## 2015-09-30 LAB — LACTIC ACID, PLASMA
LACTIC ACID, VENOUS: 1.4 mmol/L (ref 0.5–2.0)
Lactic Acid, Venous: 1.8 mmol/L (ref 0.5–2.0)

## 2015-09-30 LAB — BRAIN NATRIURETIC PEPTIDE: B NATRIURETIC PEPTIDE 5: 803.4 pg/mL — AB (ref 0.0–100.0)

## 2015-09-30 LAB — PROCALCITONIN

## 2015-09-30 MED ORDER — FUROSEMIDE 10 MG/ML IJ SOLN
20.0000 mg | Freq: Once | INTRAMUSCULAR | Status: AC
  Administered 2015-09-30: 20 mg via INTRAVENOUS
  Filled 2015-09-30: qty 2

## 2015-09-30 MED ORDER — METHYLPREDNISOLONE SODIUM SUCC 125 MG IJ SOLR
60.0000 mg | Freq: Four times a day (QID) | INTRAMUSCULAR | Status: DC
  Administered 2015-09-30 – 2015-10-02 (×9): 60 mg via INTRAVENOUS
  Filled 2015-09-30 (×9): qty 2

## 2015-09-30 MED ORDER — ALBUTEROL (5 MG/ML) CONTINUOUS INHALATION SOLN
2.5000 mg/h | INHALATION_SOLUTION | RESPIRATORY_TRACT | Status: DC
  Filled 2015-09-30: qty 20

## 2015-09-30 MED ORDER — MAGNESIUM SULFATE 2 GM/50ML IV SOLN
2.0000 g | Freq: Once | INTRAVENOUS | Status: AC
  Administered 2015-09-30: 2 g via INTRAVENOUS
  Filled 2015-09-30: qty 50

## 2015-09-30 NOTE — Progress Notes (Signed)
Patient's O2 sats dropped to 86% while ambulating on 4L O2. 

## 2015-09-30 NOTE — Progress Notes (Signed)
[facility] Progress Note  Patient: Eric Berger EXB:[id number]   PCP: Leo GrosserPICKARD,WARREN TOM, MD DOB: Nov 20, 1925   DOA: 09/23/2015   DOS: 09/30/2015   Date of Service: the patient was seen and examined on 09/30/2015  Subjective: Earlier in the morning the patient was significantly tachypneic with wheezing. Nutrition: Able to tolerate oral diet and working with speech therapy Activity: Working with physical therapy Last BM: 09/29/2015  Assessment and Plan: 1. Pneumonia sepsis with acute hypoxic respiratory failure, suspected aspiration pneumonia. Steroid induced hyperglycemia as well as leukocytosis.  Continue Augmentin and switch to IV Solu-Medrol again due to worsening wheezing Legionella streptococcus negative, flu PCR negative, coagulase-negative Staphylococcus aureus in blood culture contaminant. continue duo nebs. Appreciate input from pulmonary. Chest x-ray shows congestion ABG shows hypoxia, negative lactic acid and pro-calcitonin level. Taking BNP Giving IV Lasix. Echogram shows diastolic dysfunction with normal ejection fraction no wall motion abnormality. Recurrent hypoxic episode most likely secondary to aspiration events although currently treating for CHF and will monitor  2. Acute encephalopathy. Most likely delirium secondary to infection. Improving and started at his baseline.  3.  Acute kidney injury. Resolved. We'll continue close monitoring.  4. History of CVA. Dyslipidemia. Continuing aspirin and simvastatin.  5. Dysphagia. Appreciate input from speech therapy. Patient is on honey thick liquids.   DVT Prophylaxis: subcutaneous Heparin Nutrition: Honey thick liquids Advance goals of care discussion: DO NOT RESUSCITATE  Brief Summary of Hospitalization:  HPI: As per the H and P dictated on admission, "Eric Berger is a 82 y.o. male with history of hypertension, stroke, hyperlipidemia was brought to the ER after patient had sudden onset of  multiple episodes of nausea vomiting. Patient's wife states that prior to that patient has been having productive cough for last for 5 days. In the ER patient was found to be febrile. Chest x-ray shows possible infiltrates concerning for pneumonia. Patient also appears confused but follows commands. Abdomen appears benign. Blood cultures were obtained along with urine cultures and admitted for fever most likely from pneumonia. Patient's wife stated that he has become confused after having the fever. Has not had any recent sick contacts or recent travel." Daily update, Procedures: Admitted on 09/23/2015, modified barium swallow on 09/26/2015 Consultants: None Antibiotics: Anti-infectives    Start     Dose/Rate Route Frequency Ordered Stop   09/29/15 0000  amoxicillin-clavulanate (AUGMENTIN) 500-125 MG tablet     1 tablet Oral Every 8 hours 09/29/15 1037 10/02/15 2359   09/28/15 1400  amoxicillin-clavulanate (AUGMENTIN) 500-125 MG per tablet 500 mg     1 tablet Oral 3 times per day 09/28/15 1013     09/24/15 1900  Ampicillin-Sulbactam (UNASYN) 3 g in sodium chloride 0.9 % 100 mL IVPB  Status:  Discontinued     3 g 100 mL/hr over 60 Minutes Intravenous Every 8 hours 09/24/15 1813 09/28/15 1013   09/24/15 1800  cefTRIAXone (ROCEPHIN) 1 g in dextrose 5 % 50 mL IVPB  Status:  Discontinued     1 g 100 mL/hr over 30 Minutes Intravenous Every 24 hours 09/23/15 2144 09/24/15 1809   09/24/15 1800  azithromycin (ZITHROMAX) 500 mg in dextrose 5 % 250 mL IVPB  Status:  Discontinued     500 mg 250 mL/hr over 60 Minutes Intravenous Every 24 hours 09/23/15 2144 09/28/15 1013   09/24/15 1500  vancomycin (VANCOCIN) IVPB 1000 mg/200 mL premix  Status:  Discontinued     1,000 mg 200 mL/hr over 60 Minutes Intravenous Every 24  hours 09/24/15 1408 09/26/15 0918   09/23/15 1700  cefTRIAXone (ROCEPHIN) 1 g in dextrose 5 % 50 mL IVPB     1 g 100 mL/hr over 30 Minutes Intravenous  Once 09/23/15 1647 09/23/15 1901    09/23/15 1700  azithromycin (ZITHROMAX) 500 mg in dextrose 5 % 250 mL IVPB     500 mg 250 mL/hr over 60 Minutes Intravenous  Once 09/23/15 1647 09/23/15 1814      Family Communication: no family was present at bedside, at the time of interview.   Disposition:  Expected discharge date: 10/02/2015 Barriers to safe discharge: Improvement in oxygenation on exertion   Intake/Output Summary (Last 24 hours) at 09/30/15 1523 Last data filed at 09/30/15 1254  Gross per 24 hour  Intake    360 ml  Output    900 ml  Net   -540 ml   Filed Weights   09/27/15 2013 09/28/15 2100 09/29/15 2031  Weight: 76.6 kg (168 lb 14 oz) 78 kg (171 lb 15.3 oz) 77 kg (169 lb 12.1 oz)    Objective: Physical Exam: Filed Vitals:   09/30/15 0455 09/30/15 0602 09/30/15 0844 09/30/15 1314  BP: 183/82 148/72 179/84   Pulse: 91  103 84  Temp: 97.6 F (36.4 C)  97.6 F (36.4 C)   TempSrc: Oral  Oral   Resp: 22  23 18   Height:      Weight:      SpO2: 94%  94% 97%    General: Appear in mild distress, no Rash; Oral Mucosa moist. Cardiovascular: S1 and S2 Present, no Murmur, no JVD Respiratory: Bilateral Air entry present and basal Crackles, improved wheezes Abdomen: Bowel Sound present, Soft and no tenderness Extremities: no Pedal edema, no calf tenderness  Data Reviewed: CBC:  Recent Labs Lab 09/23/15 1555 09/24/15 0458  09/26/15 0515 09/27/15 0416 09/28/15 0542 09/29/15 0502 09/30/15 0452  WBC 10.4 9.9  < > 7.2 6.9 18.7* 19.7* 18.1*  NEUTROABS 8.4* 6.7  --   --   --   --   --  15.2*  HGB 15.2 13.2  < > 11.0* 11.2* 11.8* 11.8* 12.3*  HCT 43.7 39.1  < > 32.5* 32.9* 34.7* 34.9* 37.0*  MCV 93.0 93.8  < > 91.8 92.7 91.8 91.8 93.9  PLT 228 193  < > 190 215 300 349 390  < > = values in this interval not displayed. Basic Metabolic Panel:  Recent Labs Lab 09/26/15 0515 09/27/15 0416 09/28/15 0542 09/29/15 0502 09/30/15 0452  NA 139 143 145 144 145  K 3.6 4.1 3.3* 3.3* 3.7  CL 110 111 110 109  108  CO2 21* 23 23 24 28   GLUCOSE 108* 161* 149* 138* 107*  BUN 30* 24* 29* 30* 27*  CREATININE 1.36* 0.94 1.21 1.11 1.03  CALCIUM 7.9* 8.5* 8.6* 8.7* 8.9   Liver Function Tests:  Recent Labs Lab 09/23/15 1555 09/24/15 0458 09/27/15 0416 09/30/15 0452  AST 66* 65* 48* 91*  ALT 32 34 54 157*  ALKPHOS 50 39 49 47  BILITOT 1.2 0.8 0.7 1.0  PROT 7.7 6.3* 6.5 6.7  ALBUMIN 4.1 3.1* 2.6* 3.3*   No results for input(s): LIPASE, AMYLASE in the last 168 hours. No results for input(s): AMMONIA in the last 168 hours.  Cardiac Enzymes:  Recent Labs Lab 09/23/15 1555 09/24/15 0458 09/27/15 0416  CKTOTAL  --  1516* 227  TROPONINI 0.03  --   --     BNP (last 3 results)  No results for input(s): BNP in the last 8760 hours.  CBG: No results for input(s): GLUCAP in the last 168 hours.  Recent Results (from the past 240 hour(s))  Urine culture     Status: None   Collection Time: 09/23/15  3:50 PM  Result Value Ref Range Status   Specimen Description URINE, RANDOM  Final   Special Requests NONE  Final   Culture MULTIPLE SPECIES PRESENT, SUGGEST RECOLLECTION  Final   Report Status 09/25/2015 FINAL  Final  Blood culture (routine x 2)     Status: None   Collection Time: 09/23/15  3:55 PM  Result Value Ref Range Status   Specimen Description BLOOD RIGHT ANTECUBITAL  Final   Special Requests BOTTLES DRAWN AEROBIC AND ANAEROBIC 5CC  Final   Culture  Setup Time   Final    GRAM POSITIVE COCCI IN CLUSTERS AEROBIC BOTTLE ONLY CRITICAL RESULT CALLED TO, READ BACK BY AND VERIFIED WITH: J ADAMS,RN AT 1237 09/24/15 BY L BENFIELD    Culture   Final    STAPHYLOCOCCUS SPECIES (COAGULASE NEGATIVE) THE SIGNIFICANCE OF ISOLATING THIS ORGANISM FROM A SINGLE SET OF BLOOD CULTURES WHEN MULTIPLE SETS ARE DRAWN IS UNCERTAIN. PLEASE NOTIFY THE MICROBIOLOGY DEPARTMENT WITHIN ONE WEEK IF SPECIATION AND SENSITIVITIES ARE REQUIRED.    Report Status 09/25/2015 FINAL  Final  Blood culture (routine x 2)      Status: None   Collection Time: 09/23/15  4:00 PM  Result Value Ref Range Status   Specimen Description BLOOD RIGHT HAND  Final   Special Requests IN PEDIATRIC BOTTLE 3CC  Final   Culture NO GROWTH 5 DAYS  Final   Report Status 09/28/2015 FINAL  Final  Culture, blood (single)     Status: None   Collection Time: 09/24/15  2:04 PM  Result Value Ref Range Status   Specimen Description BLOOD RIGHT ANTECUBITAL  Final   Special Requests BOTTLES DRAWN AEROBIC ONLY 10CCS  Final   Culture NO GROWTH 5 DAYS  Final   Report Status 09/29/2015 FINAL  Final  Culture, blood (single)     Status: None (Preliminary result)   Collection Time: 09/25/15  5:20 AM  Result Value Ref Range Status   Specimen Description BLOOD RIGHT ANTECUBITAL  Final   Special Requests IN PEDIATRIC BOTTLE 3CC  Final   Culture NO GROWTH 4 DAYS  Final   Report Status PENDING  Incomplete     Studies: Dg Chest 2 View  09/30/2015  CLINICAL DATA:  Acute respiratory failure with hypoxemia.  Wheezing. EXAM: CHEST  2 VIEW COMPARISON:  09/25/2015 FINDINGS: The heart size appears enlarged there are small bilateral pleural effusions and mild diffuse interstitial edema compatible with CHF. No airspace consolidation. The bony thorax appears intact. IMPRESSION: Mild to moderate congestive heart failure. Electronically Signed   By: Signa Kell M.D.   On: 09/30/2015 09:25     Scheduled Meds: . amoxicillin-clavulanate  1 tablet Oral 3 times per day  . aspirin  325 mg Oral Daily  . enoxaparin (LOVENOX) injection  40 mg Subcutaneous QHS  . feeding supplement (ENSURE ENLIVE)  237 mL Oral BID BM  . guaiFENesin  600 mg Oral BID  . ipratropium-albuterol  3 mL Nebulization QID  . losartan  50 mg Oral Daily  . methylPREDNISolone (SOLU-MEDROL) injection  60 mg Intravenous Q6H  . pantoprazole  40 mg Oral Daily  . saccharomyces boulardii  250 mg Oral BID  . simvastatin  40 mg Oral q1800   Continuous  Infusions:  PRN Meds: acetaminophen **OR**  acetaminophen, ondansetron **OR** ondansetron (ZOFRAN) IV, RESOURCE THICKENUP CLEAR  Time spent: 30 minutes  Author: Lynden Oxford, MD [facility] Pager: [phone] 09/30/2015 3:23 PM  If 7PM-7AM, please contact night-coverage at [email or web address], password [email or web address]

## 2015-10-01 LAB — BASIC METABOLIC PANEL
Anion gap: 12 (ref 5–15)
BUN: 26 mg/dL — AB (ref 6–20)
CHLORIDE: 103 mmol/L (ref 101–111)
CO2: 28 mmol/L (ref 22–32)
CREATININE: 1.06 mg/dL (ref 0.61–1.24)
Calcium: 8.5 mg/dL — ABNORMAL LOW (ref 8.9–10.3)
GFR calc Af Amer: >60 mL/min (ref >60)
GFR calc non Af Amer: >60 mL/min (ref >60)
Glucose, Bld: 161 mg/dL — ABNORMAL HIGH (ref 65–99)
Potassium: 3.9 mmol/L (ref 3.5–5.1)
SODIUM: 143 mmol/L (ref 135–145)

## 2015-10-01 LAB — CBC
HCT: 33.5 % — ABNORMAL LOW (ref 39.0–52.0)
Hemoglobin: 11.3 g/dL — ABNORMAL LOW (ref 13.0–17.0)
MCH: 31.5 pg (ref 26.0–34.0)
MCHC: 33.7 g/dL (ref 30.0–36.0)
MCV: 93.3 fL (ref 78.0–100.0)
PLATELETS: 326 10*3/uL (ref 150–400)
RBC: 3.59 MIL/uL — ABNORMAL LOW (ref 4.22–5.81)
RDW: 13.3 % (ref 11.5–15.5)
WBC: 15.8 10*3/uL — AB (ref 4.0–10.5)

## 2015-10-01 LAB — CULTURE, BLOOD (SINGLE): CULTURE: NO GROWTH

## 2015-10-01 LAB — MAGNESIUM: Magnesium: 2.5 mg/dL — ABNORMAL HIGH (ref 1.7–2.4)

## 2015-10-01 MED ORDER — ENSURE ENLIVE PO LIQD
237.0000 mL | Freq: Every day | ORAL | Status: DC
  Administered 2015-10-02: 237 mL via ORAL

## 2015-10-01 MED ORDER — FUROSEMIDE 40 MG PO TABS
40.0000 mg | ORAL_TABLET | Freq: Every day | ORAL | Status: DC
  Administered 2015-10-01 – 2015-10-03 (×3): 40 mg via ORAL
  Filled 2015-10-01 (×3): qty 1

## 2015-10-01 MED ORDER — TAMSULOSIN HCL 0.4 MG PO CAPS
0.4000 mg | ORAL_CAPSULE | Freq: Every day | ORAL | Status: DC
  Administered 2015-10-01 – 2015-10-02 (×2): 0.4 mg via ORAL
  Filled 2015-10-01 (×2): qty 1

## 2015-10-01 NOTE — Plan of Care (Signed)
Problem: Activity: Goal: Risk for activity intolerance will decrease Outcome: Progressing Pt able to ambulate with walker and one assist. Need to continue to monitor O2 Sats.

## 2015-10-01 NOTE — Progress Notes (Signed)
[facility] Progress Note  Patient: Eric Berger ZOX:[id number]   PCP: Leo Grosser, MD DOB: September 16, 1926   DOA: 09/23/2015   DOS: 10/01/2015   Date of Service: the patient was seen and examined on 10/01/2015  Subjective: Patient complains of persistent shortness of breath, no cough no chest pain Nutrition: Able to tolerate oral diet and working with speech therapy Activity: Working with physical therapy Last BM: 10/01/2015  Assessment and Plan: 1. Pneumonia sepsis with acute hypoxic respiratory failure, suspected aspiration pneumonia. Steroid induced hyperglycemia as well as leukocytosis. Acute on chronic diastolic dysfunction  Continue Augmentin to complete total 10 day treatment She shows improvement on high-dose steroid, we will taper slowly. Legionella streptococcus negative, flu PCR negative, coagulase-negative Staphylococcus aureus in blood culture contaminant. continue duo nebs. Appreciate input from pulmonary. Chest x-ray shows congestion ABG shows hypoxia, negative lactic acid and pro-calcitonin level.  Responded to IV Lasix will continue oral Lasix. Echogram shows diastolic dysfunction with normal ejection fraction no wall motion abnormality. Recurrent hypoxic episode most likely secondary to aspiration events although currently treating for CHF and will monitor  2. Acute encephalopathy. Most likely delirium secondary to infection. Improving and started at his baseline.  3.  Acute kidney injury. Resolved. We'll continue close monitoring while being diuresed  4. History of CVA. Dyslipidemia. Continuing aspirin and simvastatin.  5. Dysphagia. Appreciate input from speech therapy. Patient is on honey thick liquids.  DVT Prophylaxis: subcutaneous Heparin Nutrition: Honey thick liquids Advance goals of care discussion: DO NOT RESUSCITATE  Brief Summary of Hospitalization:  HPI: As per the H and P dictated on admission, "Eric Berger is a 82 y.o.  male with history of hypertension, stroke, hyperlipidemia was brought to the ER after patient had sudden onset of multiple episodes of nausea vomiting. Patient's wife states that prior to that patient has been having productive cough for last for 5 days. In the ER patient was found to be febrile. Chest x-ray shows possible infiltrates concerning for pneumonia. Patient also appears confused but follows commands. Abdomen appears benign. Blood cultures were obtained along with urine cultures and admitted for fever most likely from pneumonia. Patient's wife stated that he has become confused after having the fever. Has not had any recent sick contacts or recent travel." Daily update, Procedures: Admitted on 09/23/2015, modified barium swallow on 09/26/2015,  Consultants: Pulmonary Antibiotics: Anti-infectives    Start     Dose/Rate Route Frequency Ordered Stop   09/29/15 0000  amoxicillin-clavulanate (AUGMENTIN) 500-125 MG tablet     1 tablet Oral Every 8 hours 09/29/15 1037 10/02/15 2359   09/28/15 1400  amoxicillin-clavulanate (AUGMENTIN) 500-125 MG per tablet 500 mg     1 tablet Oral 3 times per day 09/28/15 1013     09/24/15 1900  Ampicillin-Sulbactam (UNASYN) 3 g in sodium chloride 0.9 % 100 mL IVPB  Status:  Discontinued     3 g 100 mL/hr over 60 Minutes Intravenous Every 8 hours 09/24/15 1813 09/28/15 1013   09/24/15 1800  cefTRIAXone (ROCEPHIN) 1 g in dextrose 5 % 50 mL IVPB  Status:  Discontinued     1 g 100 mL/hr over 30 Minutes Intravenous Every 24 hours 09/23/15 2144 09/24/15 1809   09/24/15 1800  azithromycin (ZITHROMAX) 500 mg in dextrose 5 % 250 mL IVPB  Status:  Discontinued     500 mg 250 mL/hr over 60 Minutes Intravenous Every 24 hours 09/23/15 2144 09/28/15 1013   09/24/15 1500  vancomycin (VANCOCIN) IVPB 1000 mg/200 mL  premix  Status:  Discontinued     1,000 mg 200 mL/hr over 60 Minutes Intravenous Every 24 hours 09/24/15 1408 09/26/15 0918   09/23/15 1700  cefTRIAXone  (ROCEPHIN) 1 g in dextrose 5 % 50 mL IVPB     1 g 100 mL/hr over 30 Minutes Intravenous  Once 09/23/15 1647 09/23/15 1901   09/23/15 1700  azithromycin (ZITHROMAX) 500 mg in dextrose 5 % 250 mL IVPB     500 mg 250 mL/hr over 60 Minutes Intravenous  Once 09/23/15 1647 09/23/15 1814      Family Communication: no family was present at bedside, at the time of interview.   Disposition:  Expected discharge date: 10/02/2015 Barriers to safe discharge: Improvement in oxygenation on exertion   Intake/Output Summary (Last 24 hours) at 10/01/15 1638 Last data filed at 10/01/15 1309  Gross per 24 hour  Intake    480 ml  Output   1421 ml  Net   -941 ml   Filed Weights   09/28/15 2100 09/29/15 2031 09/30/15 2200  Weight: 78 kg (171 lb 15.3 oz) 77 kg (169 lb 12.1 oz) 73.6 kg (162 lb 4.1 oz)    Objective: Physical Exam: Filed Vitals:   10/01/15 0914 10/01/15 1000 10/01/15 1235 10/01/15 1617  BP:  119/78    Pulse:  72    Temp:  97.7 F (36.5 C)    TempSrc:  Oral    Resp:  18    Height:      Weight:      SpO2: 93% 96% 100% 100%    General: Appear in mild distress, no Rash; Oral Mucosa moist. Cardiovascular: S1 and S2 Present, no Murmur, no JVD Respiratory: Bilateral Air entry present and basal Crackles, improved wheezes Abdomen: Bowel Sound present, Soft and no tenderness Extremities: no Pedal edema, no calf tenderness  Data Reviewed: CBC:  Recent Labs Lab 09/27/15 0416 09/28/15 0542 09/29/15 0502 09/30/15 0452 10/01/15 0705  WBC 6.9 18.7* 19.7* 18.1* 15.8*  NEUTROABS  --   --   --  15.2*  --   HGB 11.2* 11.8* 11.8* 12.3* 11.3*  HCT 32.9* 34.7* 34.9* 37.0* 33.5*  MCV 92.7 91.8 91.8 93.9 93.3  PLT 215 300 349 390 326   Basic Metabolic Panel:  Recent Labs Lab 09/27/15 0416 09/28/15 0542 09/29/15 0502 09/30/15 0452 10/01/15 0705  NA 143 145 144 145 143  K 4.1 3.3* 3.3* 3.7 3.9  CL 111 110 109 108 103  CO2 23 23 24 28 28   GLUCOSE 161* 149* 138* 107* 161*  BUN  24* 29* 30* 27* 26*  CREATININE 0.94 1.21 1.11 1.03 1.06  CALCIUM 8.5* 8.6* 8.7* 8.9 8.5*  MG  --   --   --   --  2.5*   Liver Function Tests:  Recent Labs Lab 09/27/15 0416 09/30/15 0452  AST 48* 91*  ALT 54 157*  ALKPHOS 49 47  BILITOT 0.7 1.0  PROT 6.5 6.7  ALBUMIN 2.6* 3.3*   No results for input(s): LIPASE, AMYLASE in the last 168 hours. No results for input(s): AMMONIA in the last 168 hours.  Cardiac Enzymes:  Recent Labs Lab 09/27/15 0416  CKTOTAL 227    BNP (last 3 results)  Recent Labs  09/30/15 1619  BNP 803.4*    CBG: No results for input(s): GLUCAP in the last 168 hours.  Recent Results (from the past 240 hour(s))  Urine culture     Status: None   Collection Time: 09/23/15  3:50 PM  Result Value Ref Range Status   Specimen Description URINE, RANDOM  Final   Special Requests NONE  Final   Culture MULTIPLE SPECIES PRESENT, SUGGEST RECOLLECTION  Final   Report Status 09/25/2015 FINAL  Final  Blood culture (routine x 2)     Status: None   Collection Time: 09/23/15  3:55 PM  Result Value Ref Range Status   Specimen Description BLOOD RIGHT ANTECUBITAL  Final   Special Requests BOTTLES DRAWN AEROBIC AND ANAEROBIC 5CC  Final   Culture  Setup Time   Final    GRAM POSITIVE COCCI IN CLUSTERS AEROBIC BOTTLE ONLY CRITICAL RESULT CALLED TO, READ BACK BY AND VERIFIED WITH: J ADAMS,RN AT 1237 09/24/15 BY L BENFIELD    Culture   Final    STAPHYLOCOCCUS SPECIES (COAGULASE NEGATIVE) THE SIGNIFICANCE OF ISOLATING THIS ORGANISM FROM A SINGLE SET OF BLOOD CULTURES WHEN MULTIPLE SETS ARE DRAWN IS UNCERTAIN. PLEASE NOTIFY THE MICROBIOLOGY DEPARTMENT WITHIN ONE WEEK IF SPECIATION AND SENSITIVITIES ARE REQUIRED.    Report Status 09/25/2015 FINAL  Final  Blood culture (routine x 2)     Status: None   Collection Time: 09/23/15  4:00 PM  Result Value Ref Range Status   Specimen Description BLOOD RIGHT HAND  Final   Special Requests IN PEDIATRIC BOTTLE 3CC  Final    Culture NO GROWTH 5 DAYS  Final   Report Status 09/28/2015 FINAL  Final  Culture, blood (single)     Status: None   Collection Time: 09/24/15  2:04 PM  Result Value Ref Range Status   Specimen Description BLOOD RIGHT ANTECUBITAL  Final   Special Requests BOTTLES DRAWN AEROBIC ONLY 10CCS  Final   Culture NO GROWTH 5 DAYS  Final   Report Status 09/29/2015 FINAL  Final  Culture, blood (single)     Status: None   Collection Time: 09/25/15  5:20 AM  Result Value Ref Range Status   Specimen Description BLOOD RIGHT ANTECUBITAL  Final   Special Requests IN PEDIATRIC BOTTLE 3CC  Final   Culture NO GROWTH 5 DAYS  Final   Report Status 10/01/2015 FINAL  Final     Studies: No results found.   Scheduled Meds: . amoxicillin-clavulanate  1 tablet Oral 3 times per day  . aspirin  325 mg Oral Daily  . enoxaparin (LOVENOX) injection  40 mg Subcutaneous QHS  . [START ON 10/02/2015] feeding supplement (ENSURE ENLIVE)  237 mL Oral Q1500  . furosemide  40 mg Oral Daily  . guaiFENesin  600 mg Oral BID  . ipratropium-albuterol  3 mL Nebulization QID  . losartan  50 mg Oral Daily  . methylPREDNISolone (SOLU-MEDROL) injection  60 mg Intravenous Q6H  . pantoprazole  40 mg Oral Daily  . saccharomyces boulardii  250 mg Oral BID  . simvastatin  40 mg Oral q1800  . tamsulosin  0.4 mg Oral QPC supper   Continuous Infusions:  PRN Meds: acetaminophen **OR** acetaminophen, ondansetron **OR** ondansetron (ZOFRAN) IV, RESOURCE THICKENUP CLEAR  Time spent: 30 minutes  Author: Lynden OxfordPranav Shereda Graw, MD [facility] Pager: [phone] 10/01/2015 4:38 PM  If 7PM-7AM, please contact night-coverage at [email or web address], password [email or web address]

## 2015-10-01 NOTE — Progress Notes (Signed)
Nutrition Brief Note  Patient identified on the Malnutrition Screening Tool (MST) Report  Wt Readings from Last 15 Encounters:  09/30/15 162 lb 4.1 oz (73.6 kg)  12/18/14 160 lb (72.576 kg)  10/06/14 162 lb (73.483 kg)  01/05/14 167 lb (75.751 kg)  12/26/13 166 lb (75.297 kg)  09/29/13 167 lb (75.751 kg)  08/30/09 163 lb 8 oz (74.163 kg)  12/22/07 176 lb 8 oz (80.06 kg)  12/02/07 169 lb (76.658 kg)    Body mass index is 27.84 kg/(m^2). Patient meets criteria for overweight based on current BMI. Weight has been stable.   Current diet order is dysphagia 3 with nectar thick liquids, patient is consuming approximately 50-100% of meals at this time. Pt reports appetite has been good currently and PTA with no other difficulties. Pt currently has Ensure ordered. RD to continue with orders. Labs and medications reviewed.   No further nutrition interventions warranted at this time. If nutrition issues arise, please consult RD.   Roslyn SmilingStephanie Markon Jares, MS, RD, LDN Pager # [phone] After hours/ weekend pager # [phone]

## 2015-10-01 NOTE — Progress Notes (Signed)
Physical Therapy Treatment Patient Details Name: Eric Berger MRN: [id number] DOB: 05-23-1926 Today's Date: 10/01/2015    History of Present Illness 82 y.o. male with history of hypertension, stroke, hyperlipidemia, GERD and hernia brought to the ER after patient had sudden onset of multiple episodes of nausea vomiting and productive cough for last for 5 days. Per MD note pt found to be febrile and chest x-ray shows possible infiltrates concerning for pneumonia.    PT Comments    Pt progressing well towards all goals. Pt con't to require RW for safe ambulation and assist for safe transfers. Pt remains unsafe to return home alone due to increased falls risk and the possibility of having to go home on O2 and O2 management. Con't to recommend SNF upon d/c for maximal functional recovery.  SATURATION QUALIFICATIONS: (This note is used to comply with regulatory documentation for home oxygen)  Patient Saturations on Room Air at Rest = 94%  Patient Saturations on Room Air while Ambulating = 84-92%  Please briefly explain why patient needs home oxygen: during ambulation and stairs SpO2 dropped below 88%. Unable to get a consistent accurate/good wave form reading t/o ambulation.    Follow Up Recommendations  SNF;Supervision/Assistance - 24 hour     Equipment Recommendations       Recommendations for Other Services       Precautions / Restrictions Precautions Precautions: Fall Precaution Comments: monitor O2 Restrictions Weight Bearing Restrictions: No    Mobility  Bed Mobility Overal bed mobility: Needs Assistance Bed Mobility: Supine to Sit     Supine to sit: Supervision     General bed mobility comments: used bed rail, HOB flat, increased time but no physical assist required  Transfers Overall transfer level: Needs assistance Equipment used: Rolling walker (2 wheeled) Transfers: Sit to/from Stand Sit to Stand: Min guard         General transfer comment: v/c's  for hand placement  Ambulation/Gait Ambulation/Gait assistance: Min guard Ambulation Distance (Feet): 200 Feet Assistive device: Rolling walker (2 wheeled) Gait Pattern/deviations: Step-through pattern Gait velocity: decreased Gait velocity interpretation: <1.8 ft/sec, indicative of risk for recurrent falls General Gait Details: pt with fluid gait pattern and good walker mangement. mild SOB but overall fell good. 2 standing rest breaks   Stairs Stairs: Yes Stairs assistance: Min guard Stair Management: One rail Left Number of Stairs: 4 General stair comments: step- to, good technique  Wheelchair Mobility    Modified Rankin (Stroke Patients Only)       Balance           Standing balance support: No upper extremity supported Standing balance-Leahy Scale: Fair                      Cognition Arousal/Alertness: Awake/alert Behavior During Therapy: WFL for tasks assessed/performed Overall Cognitive Status: Within Functional Limits for tasks assessed                 General Comments: pt extremely HOH    Exercises      General Comments        Pertinent Vitals/Pain Pain Assessment: No/denies pain    Home Living                      Prior Function            PT Goals (current goals can now be found in the care plan section) Acute Rehab PT Goals Patient Stated Goal: go home Progress towards  PT goals: Progressing toward goals    Frequency  Min 3X/week    PT Plan Current plan remains appropriate    Co-evaluation             End of Session Equipment Utilized During Treatment: Gait belt Activity Tolerance: Patient tolerated treatment well Patient left: in chair;with call bell/phone within reach;with chair alarm set     Time: 1013-1045 PT Time Calculation (min) (ACUTE ONLY): 32 min  Charges:  $Gait Training: 8-22 mins $Therapeutic Activity: 8-22 mins                    G Codes:      Marcene Brawn 10/01/2015,  1:43 PM   Lewis Shock, PT, DPT Pager #: [phone] Office #: [phone]

## 2015-10-01 NOTE — Progress Notes (Signed)
Speech Language Pathology Treatment: Dysphagia  Patient Details Name: Eric Berger MRN: [id number] DOB: 08-Apr-1926 Today's Date: 10/01/2015 Time: [id number] SLP Time Calculation (min) (ACUTE ONLY): 9 min  Assessment / Plan / Recommendation Clinical Impression  Pt upright in chair and agreeable to PO trials of nectar thick liquids and soft solids. Pt had wet vocal quality x1 throughout intake which cleared with a cued cough, and delayed, spontaneous cough x1 after PO intake stopped. SLP reviewed results of MBS and rationale for recommendations. Will continue to follow.-   HPI HPI: 82 y.o. male with history of hypertension, stroke, hyperlipidemia, GERD and hernia brought to the ER after patient had sudden onset of multiple episodes of nausea vomiting and productive cough for last for 5 days. Per MD note pt found to be febrile and chest x-ray shows possible infiltrates concerning for pneumonia.CT abdomen " not findings to explain nausea and vomiting". Pt reports using thick liquids for "awhile after stroke and I hated it". MBS recommended to fully assess swallow function.      SLP Plan  Continue with current plan of care     Recommendations  Diet recommendations: Dysphagia 3 (mechanical soft);Nectar-thick liquid Liquids provided via: Cup;No straw Medication Administration: Whole meds with puree Supervision: Patient able to self feed;Full supervision/cueing for compensatory strategies Compensations: Slow rate;Small sips/bites;Multiple dry swallows after each bite/sip;Clear throat intermittently Postural Changes and/or Swallow Maneuvers: Seated upright 90 degrees              Oral Care Recommendations: Oral care BID Follow up Recommendations: Skilled Nursing facility Plan: Continue with current plan of care   Maxcine HamLaura Paiewonsky, M.A. CCC-SLP [phone]  Maxcine Hamaiewonsky, Lauri Purdum 10/01/2015, 11:21 AM

## 2015-10-01 NOTE — Care Management Important Message (Signed)
Important Message  Patient Details  Name: Magnus IvanWilliam H [place] MRN: [id number] Date of Birth: 01-12-1926   Medicare Important Message Given:  Yes    Rayvon CharSTUTTS, Jozalynn Noyce G 10/01/2015, 2:04 PMImportant Message  Patient Details  Name: Magnus IvanWilliam H [place] MRN: [id number] Date of Birth: 01-12-1926   Medicare Important Message Given:  Yes    Schae Cando G 10/01/2015, 2:04 PM

## 2015-10-02 DIAGNOSIS — R197 Diarrhea, unspecified: Secondary | ICD-10-CM

## 2015-10-02 LAB — CBC
HEMATOCRIT: 35.8 % — AB (ref 39.0–52.0)
Hemoglobin: 12.2 g/dL — ABNORMAL LOW (ref 13.0–17.0)
MCH: 32.2 pg (ref 26.0–34.0)
MCHC: 34.1 g/dL (ref 30.0–36.0)
MCV: 94.5 fL (ref 78.0–100.0)
Platelets: 356 10*3/uL (ref 150–400)
RBC: 3.79 MIL/uL — ABNORMAL LOW (ref 4.22–5.81)
RDW: 13.5 % (ref 11.5–15.5)
WBC: 23.5 10*3/uL — ABNORMAL HIGH (ref 4.0–10.5)

## 2015-10-02 LAB — DIFFERENTIAL
BASOS PCT: 0 %
Basophils Absolute: 0 10*3/uL (ref 0.0–0.1)
EOS ABS: 0 10*3/uL (ref 0.0–0.7)
EOS PCT: 0 %
LYMPHS ABS: 0.9 10*3/uL (ref 0.7–4.0)
Lymphocytes Relative: 4 %
MONOS PCT: 4 %
Monocytes Absolute: 0.9 10*3/uL (ref 0.1–1.0)
NEUTROS PCT: 92 %
Neutro Abs: 21.9 10*3/uL — ABNORMAL HIGH (ref 1.7–7.7)

## 2015-10-02 LAB — BASIC METABOLIC PANEL
Anion gap: 8 (ref 5–15)
BUN: 32 mg/dL — AB (ref 6–20)
CHLORIDE: 107 mmol/L (ref 101–111)
CO2: 29 mmol/L (ref 22–32)
CREATININE: 1.08 mg/dL (ref 0.61–1.24)
Calcium: 8.2 mg/dL — ABNORMAL LOW (ref 8.9–10.3)
GFR calc Af Amer: >60 mL/min (ref >60)
GFR, EST NON AFRICAN AMERICAN: 60 mL/min — AB (ref >60)
GLUCOSE: 163 mg/dL — AB (ref 65–99)
POTASSIUM: 3.9 mmol/L (ref 3.5–5.1)
Sodium: 144 mmol/L (ref 135–145)

## 2015-10-02 LAB — PROCALCITONIN: Procalcitonin: <0.1 ng/mL

## 2015-10-02 MED ORDER — PREDNISONE 20 MG PO TABS
40.0000 mg | ORAL_TABLET | Freq: Two times a day (BID) | ORAL | Status: DC
  Administered 2015-10-02 – 2015-10-03 (×2): 40 mg via ORAL
  Filled 2015-10-02 (×3): qty 2

## 2015-10-02 NOTE — Progress Notes (Signed)
[facility] Progress Note  Patient: Eric Berger ZOX:[id number]   PCP: Leo Grosser, MD DOB: 05-16-1926   DOA: 09/23/2015   DOS: 10/02/2015   Date of Service: the patient was seen and examined on 10/02/2015  Subjective: Patient had 3 episodes of loose watery bowel movement with increased white count. Breathing is getting better. Nutrition: Able to tolerate oral diet and working with speech therapy Activity: Working with physical therapy Last BM: 10/02/2015  Assessment and Plan: 1. Pneumonia sepsis with acute hypoxic respiratory failure, suspected aspiration pneumonia. Steroid induced hyperglycemia as well as leukocytosis. Acute on chronic diastolic dysfunction  Progressively negative, will discontinue Augmentin due to presence of diarrhea. Patient developes reoccurrence of hypoxia on low-dose prednisone and shows  improvement on high-dose steroid, we will taper slowly. Legionella streptococcus negative, flu PCR negative, coagulase-negative Staphylococcus aureus in blood culture contaminant. continue duo nebs. Appreciate input from pulmonary. Chest x-ray shows congestion ABG shows hypoxia, negative lactic acid and pro-calcitonin level.  Responded to IV Lasix will continue oral Lasix. Echogram shows diastolic dysfunction with normal ejection fraction no wall motion abnormality. Recurrent hypoxic episode most likely secondary to aspiration events although currently treating for CHF and will monitor  2. Acute encephalopathy. Most likely delirium secondary to infection. Improving and started at his baseline.  3.  Acute kidney injury. Resolved. We'll continue close monitoring while being diuresed  4. History of CVA. Dyslipidemia. Continuing aspirin and simvastatin.  5. Dysphagia. Appreciate input from speech therapy. Patient is on honey thick liquids.  6. Diarrhea with leukocytosis. WBC count 23.5 with neutrophils. Will check C. Difficile.  DVT Prophylaxis:  subcutaneous Heparin Nutrition: Honey thick liquids Advance goals of care discussion: DO NOT RESUSCITATE  Brief Summary of Hospitalization:  HPI: As per the H and P dictated on admission, "Eric Berger is a 82 y.o. male with history of hypertension, stroke, hyperlipidemia was brought to the ER after patient had sudden onset of multiple episodes of nausea vomiting. Patient's wife states that prior to that patient has been having productive cough for last for 5 days. In the ER patient was found to be febrile. Chest x-ray shows possible infiltrates concerning for pneumonia. Patient also appears confused but follows commands. Abdomen appears benign. Blood cultures were obtained along with urine cultures and admitted for fever most likely from pneumonia. Patient's wife stated that he has become confused after having the fever. Has not had any recent sick contacts or recent travel." Daily update, Procedures: Admitted on 09/23/2015, modified barium swallow on 09/26/2015,  Consultants: Pulmonary Antibiotics: Anti-infectives    Start     Dose/Rate Route Frequency Ordered Stop   09/29/15 0000  amoxicillin-clavulanate (AUGMENTIN) 500-125 MG tablet     1 tablet Oral Every 8 hours 09/29/15 1037 10/02/15 2359   09/28/15 1400  amoxicillin-clavulanate (AUGMENTIN) 500-125 MG per tablet 500 mg  Status:  Discontinued     1 tablet Oral 3 times per day 09/28/15 1013 10/02/15 1214   09/24/15 1900  Ampicillin-Sulbactam (UNASYN) 3 g in sodium chloride 0.9 % 100 mL IVPB  Status:  Discontinued     3 g 100 mL/hr over 60 Minutes Intravenous Every 8 hours 09/24/15 1813 09/28/15 1013   09/24/15 1800  cefTRIAXone (ROCEPHIN) 1 g in dextrose 5 % 50 mL IVPB  Status:  Discontinued     1 g 100 mL/hr over 30 Minutes Intravenous Every 24 hours 09/23/15 2144 09/24/15 1809   09/24/15 1800  azithromycin (ZITHROMAX) 500 mg in dextrose 5 % 250 mL IVPB  Status:  Discontinued     500 mg 250 mL/hr over 60 Minutes Intravenous Every  24 hours 09/23/15 2144 09/28/15 1013   09/24/15 1500  vancomycin (VANCOCIN) IVPB 1000 mg/200 mL premix  Status:  Discontinued     1,000 mg 200 mL/hr over 60 Minutes Intravenous Every 24 hours 09/24/15 1408 09/26/15 0918   09/23/15 1700  cefTRIAXone (ROCEPHIN) 1 g in dextrose 5 % 50 mL IVPB     1 g 100 mL/hr over 30 Minutes Intravenous  Once 09/23/15 1647 09/23/15 1901   09/23/15 1700  azithromycin (ZITHROMAX) 500 mg in dextrose 5 % 250 mL IVPB     500 mg 250 mL/hr over 60 Minutes Intravenous  Once 09/23/15 1647 09/23/15 1814      Family Communication: no family was present at bedside, at the time of interview.   Disposition:  Expected discharge date: 10/03/2015 Barriers to safe discharge: Improvement in oxygenation on exertion   Intake/Output Summary (Last 24 hours) at 10/02/15 1805 Last data filed at 10/02/15 1800  Gross per 24 hour  Intake    840 ml  Output    650 ml  Net    190 ml   Filed Weights   09/29/15 2031 09/30/15 2200 10/02/15 0100  Weight: 77 kg (169 lb 12.1 oz) 73.6 kg (162 lb 4.1 oz) 73.9 kg (162 lb 14.7 oz)    Objective: Physical Exam: Filed Vitals:   10/02/15 0926 10/02/15 1127 10/02/15 1611 10/02/15 1800  BP:    162/84  Pulse:   72 101  Temp:    98.2 F (36.8 C)  TempSrc:    Oral  Resp:   16 18  Height:      Weight:      SpO2: 99% 99%  100%    General: Appear in mild distress, no Rash; Oral Mucosa moist. Cardiovascular: S1 and S2 Present, no Murmur, no JVD Respiratory: Bilateral Air entry present and basal Crackles, improved wheezes Abdomen: Bowel Sound present, Soft and no tenderness Extremities: no Pedal edema, no calf tenderness  Data Reviewed: CBC:  Recent Labs Lab 09/28/15 0542 09/29/15 0502 09/30/15 0452 10/01/15 0705 10/02/15 0524  WBC 18.7* 19.7* 18.1* 15.8* 23.5*  NEUTROABS  --   --  15.2*  --  21.9*  HGB 11.8* 11.8* 12.3* 11.3* 12.2*  HCT 34.7* 34.9* 37.0* 33.5* 35.8*  MCV 91.8 91.8 93.9 93.3 94.5  PLT 300 349 390 326 356    Basic Metabolic Panel:  Recent Labs Lab 09/28/15 0542 09/29/15 0502 09/30/15 0452 10/01/15 0705 10/02/15 0524  NA 145 144 145 143 144  K 3.3* 3.3* 3.7 3.9 3.9  CL 110 109 108 103 107  CO2 23 24 28 28 29   GLUCOSE 149* 138* 107* 161* 163*  BUN 29* 30* 27* 26* 32*  CREATININE 1.21 1.11 1.03 1.06 1.08  CALCIUM 8.6* 8.7* 8.9 8.5* 8.2*  MG  --   --   --  2.5*  --    Liver Function Tests:  Recent Labs Lab 09/27/15 0416 09/30/15 0452  AST 48* 91*  ALT 54 157*  ALKPHOS 49 47  BILITOT 0.7 1.0  PROT 6.5 6.7  ALBUMIN 2.6* 3.3*   No results for input(s): LIPASE, AMYLASE in the last 168 hours. No results for input(s): AMMONIA in the last 168 hours.  Cardiac Enzymes:  Recent Labs Lab 09/27/15 0416  CKTOTAL 227    BNP (last 3 results)  Recent Labs  09/30/15 1619  BNP 803.4*    CBG:  No results for input(s): GLUCAP in the last 168 hours.  Recent Results (from the past 240 hour(s))  Urine culture     Status: None   Collection Time: 09/23/15  3:50 PM  Result Value Ref Range Status   Specimen Description URINE, RANDOM  Final   Special Requests NONE  Final   Culture MULTIPLE SPECIES PRESENT, SUGGEST RECOLLECTION  Final   Report Status 09/25/2015 FINAL  Final  Blood culture (routine x 2)     Status: None   Collection Time: 09/23/15  3:55 PM  Result Value Ref Range Status   Specimen Description BLOOD RIGHT ANTECUBITAL  Final   Special Requests BOTTLES DRAWN AEROBIC AND ANAEROBIC 5CC  Final   Culture  Setup Time   Final    GRAM POSITIVE COCCI IN CLUSTERS AEROBIC BOTTLE ONLY CRITICAL RESULT CALLED TO, READ BACK BY AND VERIFIED WITH: J ADAMS,RN AT 1237 09/24/15 BY L BENFIELD    Culture   Final    STAPHYLOCOCCUS SPECIES (COAGULASE NEGATIVE) THE SIGNIFICANCE OF ISOLATING THIS ORGANISM FROM A SINGLE SET OF BLOOD CULTURES WHEN MULTIPLE SETS ARE DRAWN IS UNCERTAIN. PLEASE NOTIFY THE MICROBIOLOGY DEPARTMENT WITHIN ONE WEEK IF SPECIATION AND SENSITIVITIES ARE REQUIRED.     Report Status 09/25/2015 FINAL  Final  Blood culture (routine x 2)     Status: None   Collection Time: 09/23/15  4:00 PM  Result Value Ref Range Status   Specimen Description BLOOD RIGHT HAND  Final   Special Requests IN PEDIATRIC BOTTLE 3CC  Final   Culture NO GROWTH 5 DAYS  Final   Report Status 09/28/2015 FINAL  Final  Culture, blood (single)     Status: None   Collection Time: 09/24/15  2:04 PM  Result Value Ref Range Status   Specimen Description BLOOD RIGHT ANTECUBITAL  Final   Special Requests BOTTLES DRAWN AEROBIC ONLY 10CCS  Final   Culture NO GROWTH 5 DAYS  Final   Report Status 09/29/2015 FINAL  Final  Culture, blood (single)     Status: None   Collection Time: 09/25/15  5:20 AM  Result Value Ref Range Status   Specimen Description BLOOD RIGHT ANTECUBITAL  Final   Special Requests IN PEDIATRIC BOTTLE 3CC  Final   Culture NO GROWTH 5 DAYS  Final   Report Status 10/01/2015 FINAL  Final     Studies: No results found.   Scheduled Meds: . aspirin  325 mg Oral Daily  . enoxaparin (LOVENOX) injection  40 mg Subcutaneous QHS  . feeding supplement (ENSURE ENLIVE)  237 mL Oral Q1500  . furosemide  40 mg Oral Daily  . guaiFENesin  600 mg Oral BID  . ipratropium-albuterol  3 mL Nebulization QID  . losartan  50 mg Oral Daily  . pantoprazole  40 mg Oral Daily  . predniSONE  40 mg Oral BID WC  . saccharomyces boulardii  250 mg Oral BID  . simvastatin  40 mg Oral q1800  . tamsulosin  0.4 mg Oral QPC supper   Continuous Infusions:  PRN Meds: acetaminophen **OR** acetaminophen, ondansetron **OR** ondansetron (ZOFRAN) IV, RESOURCE THICKENUP CLEAR  Time spent: 30 minutes  Author: Lynden Oxford, MD [facility] Pager: [phone] 10/02/2015 6:05 PM  If 7PM-7AM, please contact night-coverage at [email or web address], password [facility]

## 2015-10-03 ENCOUNTER — Inpatient Hospital Stay (HOSPITAL_COMMUNITY): Payer: Medicare HMO

## 2015-10-03 ENCOUNTER — Telehealth: Payer: Self-pay | Admitting: *Deleted

## 2015-10-03 DIAGNOSIS — G9340 Encephalopathy, unspecified: Secondary | ICD-10-CM

## 2015-10-03 DIAGNOSIS — G43A0 Cyclical vomiting, not intractable: Secondary | ICD-10-CM

## 2015-10-03 DIAGNOSIS — I5033 Acute on chronic diastolic (congestive) heart failure: Secondary | ICD-10-CM

## 2015-10-03 DIAGNOSIS — A419 Sepsis, unspecified organism: Principal | ICD-10-CM

## 2015-10-03 DIAGNOSIS — J189 Pneumonia, unspecified organism: Secondary | ICD-10-CM

## 2015-10-03 DIAGNOSIS — N179 Acute kidney failure, unspecified: Secondary | ICD-10-CM

## 2015-10-03 LAB — CBC WITH DIFFERENTIAL/PLATELET
BASOS PCT: 0 %
Basophils Absolute: 0 10*3/uL (ref 0.0–0.1)
Eosinophils Absolute: 0 10*3/uL (ref 0.0–0.7)
Eosinophils Relative: 0 %
HEMATOCRIT: 34.7 % — AB (ref 39.0–52.0)
HEMOGLOBIN: 11.9 g/dL — AB (ref 13.0–17.0)
LYMPHS ABS: 0.5 10*3/uL — AB (ref 0.7–4.0)
LYMPHS PCT: 3 %
MCH: 32.6 pg (ref 26.0–34.0)
MCHC: 34.3 g/dL (ref 30.0–36.0)
MCV: 95.1 fL (ref 78.0–100.0)
MONO ABS: 1 10*3/uL (ref 0.1–1.0)
MONOS PCT: 5 %
NEUTROS ABS: 18 10*3/uL — AB (ref 1.7–7.7)
Neutrophils Relative %: 92 %
PLATELETS: 329 10*3/uL (ref 150–400)
RBC: 3.65 MIL/uL — ABNORMAL LOW (ref 4.22–5.81)
RDW: 13.3 % (ref 11.5–15.5)
WBC: 19.5 10*3/uL — ABNORMAL HIGH (ref 4.0–10.5)

## 2015-10-03 LAB — BASIC METABOLIC PANEL
ANION GAP: 9 (ref 5–15)
BUN: 30 mg/dL — ABNORMAL HIGH (ref 6–20)
CALCIUM: 8.2 mg/dL — AB (ref 8.9–10.3)
CHLORIDE: 104 mmol/L (ref 101–111)
CO2: 27 mmol/L (ref 22–32)
Creatinine, Ser: 0.97 mg/dL (ref 0.61–1.24)
GFR calc Af Amer: >60 mL/min (ref >60)
GFR calc non Af Amer: >60 mL/min (ref >60)
GLUCOSE: 150 mg/dL — AB (ref 65–99)
Potassium: 4.2 mmol/L (ref 3.5–5.1)
Sodium: 140 mmol/L (ref 135–145)

## 2015-10-03 MED ORDER — RESOURCE THICKENUP CLEAR PO POWD: 1.0000 g | ORAL | Status: DC | PRN

## 2015-10-03 NOTE — Telephone Encounter (Signed)
Received fax on 09/25/15 stating this pt has been admitted to acute care hospital  [facility]  Admit Date: 09/23/15  Dx: N28.9-Disorder of kidney and ureter,unspecified  Admitting physician: Renae FickleShort,Mackenzie, MD  Pending authorization: [id number]

## 2015-10-03 NOTE — Consult Note (Signed)
   [facility]   10/03/2015  Eric Berger 14-Oct-1925 [id number]   [facility]. [facility] not at bedside to [facility]. Will follow [facility] at later time [facility]. Spoke with inpatient RNCM and inpatient Licensed CSW about patient.  Raiford NobleAtika Brandalynn Ofallon, MSN-Ed, RN,BSN [facility] LPHN Care Management Hospital Liaison [phone]

## 2015-10-03 NOTE — Discharge Summary (Signed)
Physician Discharge Summary  Magnus IvanWilliam H Cranmore ZOX:[id number] DOB: 11/08/1925 DOA: 09/23/2015  PCP: Leo GrosserPICKARD,WARREN TOM, MD  Admit date: 09/23/2015 Discharge date: 10/03/2015  Time spent: 55 minutes  Recommendations for Outpatient Follow-up:  1. Outpatient speech therapy follow-up  Discharge Condition: Stable  Discharge Diagnoses:  Principal Problem:   CAP (community acquired pneumonia) Active Problems:   HTN (hypertension)   Acute encephalopathy   ARF (acute renal failure) (HCC)   Nausea with vomiting   Sepsis (HCC)   Mitral regurgitation   Acute on chronic diastolic heart failure (HCC)   History of present illness:  82 year old male with a history of hypertension, CVA, hyperlipidemia who was brought to the ER after multiple episodes of vomiting. The patient had been having a productive cough for about 5 days. He was noted to be febrile and had a chest x-ray suggestive of infiltrates concerning for pneumonia. The patient was admitted and started on IV antibiotics.  Hospital Course:  Pneumonia-possibly aspiration/ sepsis -Treated with a 10 day course of antibiotics-initially given Rocephin and Zithromax and subsequently changed over to Unasyn and then Augmentin -Flu PCR negative, Legionella and streptococcal antigens negative - chest x-ray today reveals clearing of infiltrates -Has ambulated in the hall today with minimal assistance and oxygen saturations are noted to be close to 95% on room air -Has been on high-dose steroids for wheezing and will go home with a prednisone taper  Dysphagia -Swallow eval and MBS reveals that he has significant dysphagia and honey thick liquids are recommended -Would recommend he follow up with outpatient speech therapy  Acute on chronic diastolic heart failure -Is likely secondary to fluid overload related to IV fluids and resolved with Lasix  Acute encephalopathy -Per wife, the patient became confused after fever started -Likely secondary  to above infection and has improved  Acute renal failure on admission - Improved with hydration  Procedures:  Modified barium swallow  ECHO:   Left ventricle: The cavity size was normal. Systolic function was normal. The estimated ejection fraction was in the range of 55% to 60%. Wall motion was normal; there were no regional wall motion abnormalities. - Aortic valve: Trileaflet; normal thickness, mildly calcified leaflets. - Mitral valve: There was moderate regurgitation. - Left atrium: The atrium was moderately dilated. - Right atrium: The atrium was mildly dilated. - Tricuspid valve: There was mild-moderate regurgitation. - Pulmonary arteries: Systolic pressure was moderately increased. PA peak pressure: 56 mm Hg (S).  Consultations:  Pulmonary  Discharge Exam: Filed Weights   09/30/15 2200 10/02/15 0100 10/02/15 2040  Weight: 73.6 kg (162 lb 4.1 oz) 73.9 kg (162 lb 14.7 oz) 74.1 kg (163 lb 5.8 oz)   Filed Vitals:   10/03/15 0927 10/03/15 0937  BP:  145/60  Pulse: 60 57  Temp:  98.4 F (36.9 C)  Resp: 18 18    General: AAO x 3, no distress Cardiovascular: RRR, no murmurs  Respiratory: clear to auscultation bilaterally GI: soft, non-tender, non-distended, bowel sound positive  Discharge Instructions You were cared for by a hospitalist during your hospital stay. If you have any questions about your discharge medications or the care you received while you were in the hospital after you are discharged, you can call the unit and asked to speak with the hospitalist on call if the hospitalist that took care of you is not available. Once you are discharged, your primary care physician will handle any further medical issues. Please note that NO REFILLS for any discharge medications will be authorized once  you are discharged, as it is imperative that you return to your primary care physician (or establish a relationship with a primary care physician if you do not  have one) for your aftercare needs so that they can reassess your need for medications and monitor your lab values.      Discharge Instructions    DIET DYS 3    Complete by:  As directed   Fluid consistency:  Nectar Thick     Discharge instructions    Complete by:  As directed   It is important that you read following instructions as well as go over your medication list with RN to help you understand your care after this hospitalization.  Discharge Instructions: Diet recommendations: Dysphagia 3 (mechanical soft);Nectar-thick liquid (pt agreed to thick liquids) Liquids provided via: Cup;No straw Medication Administration: Whole meds with puree Supervision: Patient able to self feed;Full supervision/cueing for compensatory strategies Compensations: Slow rate;Small sips/bites;Multiple dry swallows after each bite/sip;Clear throat intermittently Postural Changes and/or Swallow Maneuvers: Seated upright 90 degrees  Follow up with PCP in 1 week for pneumonia.   Please request your primary care physician to go over all Hospital Tests and Procedure/Radiological results at the follow up,  Please get all Hospital records sent to your PCP by signing hospital release before you go home.  You were cared for by a hospitalist during your hospital stay. If you have any questions about your discharge medications or the care you received while you were in the hospital after you are discharged, you can call the unit and ask to speak with the hospitalist on call if the hospitalist that took care of you is not available.  Once you are discharged, your primary care physician will handle any further medical issues. Please note that NO REFILLS for any discharge medications will be authorized once you are discharged, as it is imperative that you return to your primary care physician (or establish a relationship with a primary care physician if you do not have one) for your aftercare needs so that they can reassess  your need for medications and monitor your lab values. You Must read complete instructions/literature along with all the possible adverse reactions/side effects for all the Medicines you take and that have been prescribed to you. Take any new Medicines after you have completely understood and accept all the possible adverse reactions/side effects. Wear Seat belts while driving. If you have smoked or chewed Tobacco in the last 2 yrs please stop smoking and/or stop any Recreational drug use.     Discharge instructions    Complete by:  As directed   Mechanical soft diet with honey thick liquids, low sodium heart healthy     Flutter valve    Complete by:  As directed      Increase activity slowly    Complete by:  As directed      Increase activity slowly    Complete by:  As directed             Medication List    STOP taking these medications        DELSYM PO      TAKE these medications        albuterol-ipratropium 18-103 MCG/ACT inhaler  Commonly known as:  COMBIVENT  Inhale 2 puffs into the lungs 4 (four) times daily.     aspirin 325 MG tablet  Take 325 mg by mouth daily.     cholecalciferol 1000 units tablet  Commonly known as:  VITAMIN  D  Take 1,000 Units by mouth daily.     Cinnamon 500 MG capsule  Take 500 mg by mouth daily.     GARLIC PO  Take 1 tablet by mouth daily.     glucosamine-chondroitin 500-400 MG tablet  Take 1 tablet by mouth 3 (three) times daily.     guaiFENesin 600 MG 12 hr tablet  Commonly known as:  MUCINEX  Take 1 tablet (600 mg total) by mouth 2 (two) times daily.     lansoprazole 15 MG capsule  Commonly known as:  PREVACID  Take 15 mg by mouth daily at 12 noon.     losartan 50 MG tablet  Commonly known as:  COZAAR  Take 1 tablet (50 mg total) by mouth daily.     OVER THE COUNTER MEDICATION  Mixes vinegar and grape juice in small glass and drinks at night-for metabolism     predniSONE 10 MG tablet  Commonly known as:  DELTASONE   Take 50mg  daily for 3days,Take 40mg  daily for 3days,Take 30mg  daily for 3days,Take 20mg  daily for 3days,Take 10mg  daily for 3days,then stop     RESOURCE THICKENUP CLEAR Powd  Take 1 g by mouth as needed.     saw palmetto 500 MG capsule  Take 500 mg by mouth daily.     simvastatin 40 MG tablet  Commonly known as:  ZOCOR  TAKE ONE TABLET BY MOUTH ONCE DAILY AT 6PM       No Known Allergies Follow-up Information    Follow up with [facility] TOM, MD. Schedule an appointment as soon as possible for a visit in 1 week.   Specialty:  Family Medicine   Contact information:   [address] [place] [address] [phone]       Follow up with [facility] .   Specialty:  Skilled Nursing Facility   Contact information:   [address] [place] [address] [phone]       The results of significant diagnostics from this hospitalization (including imaging, microbiology, ancillary and laboratory) are listed below for reference.    Significant Diagnostic Studies: Ct Abdomen Pelvis Wo Contrast  09/24/2015  CLINICAL DATA:  Fever nausea vomiting since yesterday. EXAM: CT ABDOMEN AND PELVIS WITHOUT CONTRAST TECHNIQUE: Multidetector CT imaging of the abdomen and pelvis was performed following the standard protocol without IV contrast. COMPARISON:  06/08/2012 FINDINGS: Lower chest: 4 mm posterior right middle lobe pulmonary nodule noted. Right lower lobe collapse/consolidation with mild atelectasis or infiltrate in the left base. Hepatobiliary: No focal abnormality in the liver on this study without intravenous contrast. No evidence of hepatomegaly. Small rim calcified structures along the posterior liver capsular stable. Patient is status post cholecystectomy. No intrahepatic or extrahepatic biliary dilation. Pancreas: No focal mass lesion. No dilatation of the main duct. No intraparenchymal cyst. No peripancreatic edema. Spleen: No  splenomegaly. No focal mass lesion. Adrenals/Urinary Tract: No adrenal nodule or mass. Right kidney is unremarkable. There is evidence of central sinus cysts in the left kidney, as seen previously. Fine calcification along the wall of 1 of the cysts is also stable. No evidence for hydroureter. The urinary bladder appears normal for the degree of distention. Stomach/Bowel: Stomach is nondistended. No gastric wall thickening. No evidence of outlet obstruction. Duodenum is normally positioned as is the ligament of Treitz. No small bowel wall thickening. No small bowel dilatation. The terminal ileum is normal. The appendix is normal. No gross colonic mass. No colonic wall  thickening. No substantial diverticular change. Vascular/Lymphatic: There is abdominal aortic atherosclerosis without aneurysm. There is no gastrohepatic or hepatoduodenal ligament lymphadenopathy. No intraperitoneal or retroperitoneal lymphadenopy. No pelvic sidewall lymphadenopathy. Reproductive: Prostate gland appears enlarged. Other: No intraperitoneal free fluid. Musculoskeletal: Gas loculation the subcutaneous fat of the lower left anterior abdominal wall presumably represent injection site. Left inguinal hernia contains prominent venous anatomy and varicocele could have this appearance. Bone windows reveal no worrisome lytic or sclerotic osseous lesions. IMPRESSION: 1. No CT findings to explain the patient's history of nausea and vomiting with fever. 2. Bilateral lower lobe collapse/consolidation suspicious for pneumonia. 3. 4 mm right middle lobe pulmonary nodule. If the patient is at high risk for bronchogenic carcinoma, follow-up chest CT at 1 year is recommended. If the patient is at low risk, no follow-up is needed. This recommendation follows the consensus statement: Guidelines for Management of Small Pulmonary Nodules Detected on CT Scans: A Statement from the Fleischner Society as published in Radiology 2005; 237:395-400. 4. Central  sinus cysts in the left kidney. 5. Prostatomegaly. 6. Abdominal aortic atherosclerosis. Electronically Signed   By: Kennith Center M.D.   On: 09/24/2015 11:50   Dg Chest 2 View  09/30/2015  CLINICAL DATA:  Acute respiratory failure with hypoxemia.  Wheezing. EXAM: CHEST  2 VIEW COMPARISON:  09/25/2015 FINDINGS: The heart size appears enlarged there are small bilateral pleural effusions and mild diffuse interstitial edema compatible with CHF. No airspace consolidation. The bony thorax appears intact. IMPRESSION: Mild to moderate congestive heart failure. Electronically Signed   By: Signa Kell M.D.   On: 09/30/2015 09:25   Dg Chest 2 View  09/23/2015  CLINICAL DATA:  Patient with cough, fever and weakness for 3 days. EXAM: CHEST  2 VIEW COMPARISON:  Chest radiograph 07/18/2009 FINDINGS: Patient is rotated. Stable enlarged cardiac and mediastinal contours. Dependent heterogeneous opacities within the lung bases bilaterally. No pleural effusion or pneumothorax. Thoracic spine degenerative changes. IMPRESSION: Heterogeneous opacities within the lung bases bilaterally favored to represent atelectasis. Infection not excluded. Electronically Signed   By: Annia Belt M.D.   On: 09/23/2015 16:25   Ct Head Wo Contrast  09/24/2015  CLINICAL DATA:  Acute onset of productive cough and chills. Nausea and vomiting. Mild shortness of breath. Initial encounter. EXAM: CT HEAD WITHOUT CONTRAST TECHNIQUE: Contiguous axial images were obtained from the base of the skull through the vertex without intravenous contrast. COMPARISON:  CT of the head performed 07/18/2009, and MRI/MRA of the head performed 07/19/2009 FINDINGS: There is no evidence of acute infarction, mass lesion, or intra- or extra-axial hemorrhage on CT. Prominence of ventricles and sulci reflects mild cortical volume loss. Scattered periventricular and subcortical white matter change likely reflects small vessel ischemic microangiopathy. Chronic lacunar infarcts  are noted at the basal ganglia bilaterally. The brainstem and fourth ventricle are within normal limits. The cerebral hemispheres demonstrate grossly normal gray-white differentiation. No mass effect or midline shift is seen. There is no evidence of fracture; visualized osseous structures are unremarkable in appearance. The visualized portions of the orbits are within normal limits. The paranasal sinuses and mastoid air cells are well-aerated. No significant soft tissue abnormalities are seen. IMPRESSION: 1. No acute intracranial pathology seen on CT. 2. Mild cortical volume loss and scattered small vessel microangiopathy. 3. Chronic lacunar infarcts at the basal ganglia bilaterally. Electronically Signed   By: Roanna Raider M.D.   On: 09/24/2015 00:10   Dg Chest Port 1 View  10/03/2015  CLINICAL DATA:  Hypoxemia. EXAM:  PORTABLE CHEST 1 VIEW COMPARISON:  09/30/2015. FINDINGS: The heart is upper limits of normal in size and stable. There is tortuosity and calcification of the thoracic aorta. Chronic bronchitic type interstitial lung changes and streaky basilar scarring changes. Streaky basilar atelectasis. No definite infiltrates or effusions. IMPRESSION: Chronic bronchitic type interstitial changes and pulmonary scarring but no definite infiltrates or effusions. Electronically Signed   By: Rudie Meyer M.D.   On: 10/03/2015 12:18   Dg Chest Port 1 View  09/25/2015  CLINICAL DATA:  Wheezing and shortness of breath. Patient reports a diagnosis of pneumonia. Also have a syncopal episode 3 days ago. EXAM: PORTABLE CHEST 1 VIEW COMPARISON:  09/23/2015 FINDINGS: Cardiac silhouette is normal in size. No mediastinal or hilar masses. Interstitial opacities at the lung bases have increased most evident on the right. There is milder perihilar interstitial prominence, greater on the right. No convincing pleural effusion.  No pneumothorax. Bony thorax is demineralized but grossly intact. IMPRESSION: 1. Worsening lung  aeration when compared the prior exam, and when allowing for differences in technique. There is an increase in interstitial opacities at the lung bases, right greater than left. Findings may reflect acute bronchitis/bronchopneumonia, atelectasis or a combination. Electronically Signed   By: Amie Portland M.D.   On: 09/25/2015 13:50   Dg Swallowing Func-speech Pathology  09/26/2015  Objective Swallowing Evaluation:   MBS Patient Details Name: TYWONE BEMBENEK MRN: [id number] Date of Birth: October 04, 1925 Today's Date: 09/26/2015 Time: SLP Start Time (ACUTE ONLY): 1037-SLP Stop Time (ACUTE ONLY): 1055 SLP Time Calculation (min) (ACUTE ONLY): 18 min Past Medical History: @PMH @ Past Surgical History: Past Surgical History Procedure Laterality Date . Cholecystectomy   . Hernia repair   . Eye surgery   HPI: 82 y.o. male with history of hypertension, stroke, hyperlipidemia, GERD and hernia brought to the ER after patient had sudden onset of multiple episodes of nausea vomiting and productive cough for last for 5 days. Per MD note pt found to be febrile and chest x-ray shows possible infiltrates concerning for pneumonia.CT abdomen " not findings to explain nausea and vomiting". Pt reports using thick liquids for "awhile after stroke and I hated it". MBS recommended to fully assess swallow function. No Data Recorded Assessment / Plan / Recommendation CHL IP CLINICAL IMPRESSIONS 09/26/2015 Therapy Diagnosis Moderate oral phase dysphagia;Severe pharyngeal phase dysphagia;Moderate pharyngeal phase dysphagia Clinical Impression Moderate oral and mod-severe pharyngeal dysphagia marked by sensorimotor deficits leading to laryngeal penetration on top of vocal cords (silent) during initial swallow and second swallow in attempts to clear mod-max pyriform sinus residue. Chin tuck posture not attempted due to severity of pyriform sinus residue and increased risk with anterior flex neck position. Decreased sensation and is unaware of  pharyngeal residue. Honey thick liquids are recommended however pt has stated on 2 separate occasions that he will NOT drink thick liquids again. SLP explained chronic aspiration risk and he responded "that's just a risk I'll have to take." SLP will leave pt on thin liquids and Dys 3 texture and attempt to inform and educate wife. Concerned with future hospitalizations with pna.   Impact on safety and function Severe aspiration risk   CHL IP TREATMENT RECOMMENDATION 09/26/2015 Treatment Recommendations Therapy as outlined in treatment plan below   Prognosis 09/26/2015 Prognosis for Safe Diet Advancement Fair Barriers to Reach Goals Severity of deficits Barriers/Prognosis Comment -- CHL IP DIET RECOMMENDATION 09/26/2015 SLP Diet Recommendations Thin liquid;Dysphagia 3 (Mech soft) solids Liquid Administration via Cup;No straw Medication Administration Whole meds  with puree Compensations Slow rate;Small sips/bites;Multiple dry swallows after each bite/sip;Clear throat intermittently Postural Changes Remain semi-upright after after feeds/meals (Comment)   CHL IP OTHER RECOMMENDATIONS 09/26/2015 Recommended Consults -- Oral Care Recommendations Oral care BID Other Recommendations --   CHL IP FOLLOW UP RECOMMENDATIONS 09/26/2015 Follow up Recommendations None   CHL IP FREQUENCY AND DURATION 09/26/2015 Speech Therapy Frequency (ACUTE ONLY) min 2x/week Treatment Duration 2 weeks      CHL IP ORAL PHASE 09/26/2015 Oral Phase Impaired Oral - Pudding Teaspoon -- Oral - Pudding Cup -- Oral - Honey Teaspoon -- Oral - Honey Cup Delayed oral transit Oral - Nectar Teaspoon -- Oral - Nectar Cup Delayed oral transit Oral - Nectar Straw -- Oral - Thin Teaspoon -- Oral - Thin Cup -- Oral - Thin Straw -- Oral - Puree Delayed oral transit;Decreased bolus cohesion Oral - Mech Soft -- Oral - Regular NT Oral - Multi-Consistency -- Oral - Pill -- Oral Phase - Comment --  CHL IP PHARYNGEAL PHASE 09/26/2015 Pharyngeal Phase Impaired Pharyngeal- Pudding  Teaspoon -- Pharyngeal -- Pharyngeal- Pudding Cup -- Pharyngeal -- Pharyngeal- Honey Teaspoon -- Pharyngeal -- Pharyngeal- Honey Cup Penetration/Aspiration during swallow;Pharyngeal residue - valleculae;Pharyngeal residue - pyriform;Reduced tongue base retraction;Reduced laryngeal elevation Pharyngeal Material enters airway, remains ABOVE vocal cords and not ejected out Pharyngeal- Nectar Teaspoon -- Pharyngeal -- Pharyngeal- Nectar Cup Delayed swallow initiation-vallecula;Pharyngeal residue - valleculae;Pharyngeal residue - pyriform;Reduced tongue base retraction;Reduced laryngeal elevation Pharyngeal -- Pharyngeal- Nectar Straw -- Pharyngeal -- Pharyngeal- Thin Teaspoon -- Pharyngeal -- Pharyngeal- Thin Cup Delayed swallow initiation-pyriform sinuses;Pharyngeal residue - valleculae;Pharyngeal residue - pyriform;Reduced tongue base retraction;Reduced laryngeal elevation;Penetration/Aspiration during swallow Pharyngeal Material enters airway, CONTACTS cords and not ejected out Pharyngeal- Thin Straw Penetration/Aspiration during swallow;Reduced laryngeal elevation;Reduced tongue base retraction;Pharyngeal residue - valleculae;Pharyngeal residue - pyriform Pharyngeal Material enters airway, remains ABOVE vocal cords and not ejected out Pharyngeal- Puree -- Pharyngeal -- Pharyngeal- Mechanical Soft -- Pharyngeal -- Pharyngeal- Regular -- Pharyngeal -- Pharyngeal- Multi-consistency -- Pharyngeal -- Pharyngeal- Pill -- Pharyngeal -- Pharyngeal Comment --  CHL IP CERVICAL ESOPHAGEAL PHASE 09/26/2015 Cervical Esophageal Phase (No Data) Pudding Teaspoon -- Pudding Cup -- Honey Teaspoon -- Honey Cup -- Nectar Teaspoon -- Nectar Cup -- Nectar Straw -- Thin Teaspoon -- Thin Cup -- Thin Straw -- Puree -- Mechanical Soft -- Regular -- Multi-consistency -- Pill -- Cervical Esophageal Comment -- Royce Macadamia 09/26/2015, 1:32 PM Breck Coons Litaker M.Ed [employer] [phone]               Microbiology: Recent Results  (from the past 240 hour(s))  Urine culture     Status: None   Collection Time: 09/23/15  3:50 PM  Result Value Ref Range Status   Specimen Description URINE, RANDOM  Final   Special Requests NONE  Final   Culture MULTIPLE SPECIES PRESENT, SUGGEST RECOLLECTION  Final   Report Status 09/25/2015 FINAL  Final  Blood culture (routine x 2)     Status: None   Collection Time: 09/23/15  3:55 PM  Result Value Ref Range Status   Specimen Description BLOOD RIGHT ANTECUBITAL  Final   Special Requests BOTTLES DRAWN AEROBIC AND ANAEROBIC 5CC  Final   Culture  Setup Time   Final    GRAM POSITIVE COCCI IN CLUSTERS AEROBIC BOTTLE ONLY CRITICAL RESULT CALLED TO, READ BACK BY AND VERIFIED WITH: J ADAMS,RN AT 1237 09/24/15 BY L BENFIELD    Culture   Final    STAPHYLOCOCCUS SPECIES (COAGULASE NEGATIVE) THE SIGNIFICANCE  OF ISOLATING THIS ORGANISM FROM A SINGLE SET OF BLOOD CULTURES WHEN MULTIPLE SETS ARE DRAWN IS UNCERTAIN. PLEASE NOTIFY THE MICROBIOLOGY DEPARTMENT WITHIN ONE WEEK IF SPECIATION AND SENSITIVITIES ARE REQUIRED.    Report Status 09/25/2015 FINAL  Final  Blood culture (routine x 2)     Status: None   Collection Time: 09/23/15  4:00 PM  Result Value Ref Range Status   Specimen Description BLOOD RIGHT HAND  Final   Special Requests IN PEDIATRIC BOTTLE 3CC  Final   Culture NO GROWTH 5 DAYS  Final   Report Status 09/28/2015 FINAL  Final  Culture, blood (single)     Status: None   Collection Time: 09/24/15  2:04 PM  Result Value Ref Range Status   Specimen Description BLOOD RIGHT ANTECUBITAL  Final   Special Requests BOTTLES DRAWN AEROBIC ONLY 10CCS  Final   Culture NO GROWTH 5 DAYS  Final   Report Status 09/29/2015 FINAL  Final  Culture, blood (single)     Status: None   Collection Time: 09/25/15  5:20 AM  Result Value Ref Range Status   Specimen Description BLOOD RIGHT ANTECUBITAL  Final   Special Requests IN PEDIATRIC BOTTLE 3CC  Final   Culture NO GROWTH 5 DAYS  Final   Report Status  10/01/2015 FINAL  Final     Labs: Basic Metabolic Panel:  Recent Labs Lab 09/29/15 0502 09/30/15 0452 10/01/15 0705 10/02/15 0524 10/03/15 0556  NA 144 145 143 144 140  K 3.3* 3.7 3.9 3.9 4.2  CL 109 108 103 107 104  CO2 24 28 28 29 27   GLUCOSE 138* 107* 161* 163* 150*  BUN 30* 27* 26* 32* 30*  CREATININE 1.11 1.03 1.06 1.08 0.97  CALCIUM 8.7* 8.9 8.5* 8.2* 8.2*  MG  --   --  2.5*  --   --    Liver Function Tests:  Recent Labs Lab 09/27/15 0416 09/30/15 0452  AST 48* 91*  ALT 54 157*  ALKPHOS 49 47  BILITOT 0.7 1.0  PROT 6.5 6.7  ALBUMIN 2.6* 3.3*   No results for input(s): LIPASE, AMYLASE in the last 168 hours. No results for input(s): AMMONIA in the last 168 hours. CBC:  Recent Labs Lab 09/29/15 0502 09/30/15 0452 10/01/15 0705 10/02/15 0524 10/03/15 0556  WBC 19.7* 18.1* 15.8* 23.5* 19.5*  NEUTROABS  --  15.2*  --  21.9* 18.0*  HGB 11.8* 12.3* 11.3* 12.2* 11.9*  HCT 34.9* 37.0* 33.5* 35.8* 34.7*  MCV 91.8 93.9 93.3 94.5 95.1  PLT 349 390 326 356 329   Cardiac Enzymes:  Recent Labs Lab 09/27/15 0416  CKTOTAL 227   BNP: BNP (last 3 results)  Recent Labs  09/30/15 1619  BNP 803.4*    ProBNP (last 3 results) No results for input(s): PROBNP in the last 8760 hours.  CBG: No results for input(s): GLUCAP in the last 168 hours.     SignedCalvert Cantor, MD [facility] 10/03/2015, 1:23 PM

## 2015-10-03 NOTE — Clinical Social Work Note (Addendum)
Patient will discharge home today, transported by patient's son, Paulita CradleJay Tootle-HCPOA [phone]). If son unable to get patient, he will be transported by ambulance.   Genelle BalVanessa Yeira Gulden, MSW, LCSW Licensed Clinical Social Worker Clinical Social Work Department [facility] [phone]

## 2015-10-03 NOTE — Progress Notes (Signed)
Magnus IvanWilliam H Rings to be D/C'd Home per MD order.  Discussed prescriptions and follow up appointments with the patient. Prescriptions given to patient, medication list explained in detail. Pt verbalized understanding.    Medication List    STOP taking these medications        DELSYM PO      TAKE these medications        albuterol-ipratropium 18-103 MCG/ACT inhaler  Commonly known as:  COMBIVENT  Inhale 2 puffs into the lungs 4 (four) times daily.     aspirin 325 MG tablet  Take 325 mg by mouth daily.     cholecalciferol 1000 units tablet  Commonly known as:  VITAMIN D  Take 1,000 Units by mouth daily.     Cinnamon 500 MG capsule  Take 500 mg by mouth daily.     GARLIC PO  Take 1 tablet by mouth daily.     glucosamine-chondroitin 500-400 MG tablet  Take 1 tablet by mouth 3 (three) times daily.     guaiFENesin 600 MG 12 hr tablet  Commonly known as:  MUCINEX  Take 1 tablet (600 mg total) by mouth 2 (two) times daily.     lansoprazole 15 MG capsule  Commonly known as:  PREVACID  Take 15 mg by mouth daily at 12 noon.     losartan 50 MG tablet  Commonly known as:  COZAAR  Take 1 tablet (50 mg total) by mouth daily.     OVER THE COUNTER MEDICATION  Mixes vinegar and grape juice in small glass and drinks at night-for metabolism     predniSONE 10 MG tablet  Commonly known as:  DELTASONE  Take 50mg  daily for 3days,Take 40mg  daily for 3days,Take 30mg  daily for 3days,Take 20mg  daily for 3days,Take 10mg  daily for 3days,then stop     RESOURCE THICKENUP CLEAR Powd  Take 1 g by mouth as needed.     saw palmetto 500 MG capsule  Take 500 mg by mouth daily.     simvastatin 40 MG tablet  Commonly known as:  ZOCOR  TAKE ONE TABLET BY MOUTH ONCE DAILY AT 6PM        Filed Vitals:   10/03/15 0937 10/03/15 1710  BP: 145/60 136/68  Pulse: 57 66  Temp: 98.4 F (36.9 C) 98.6 F (37 C)  Resp: 18 20    Skin clean, dry and intact without evidence of skin break down, no  evidence of skin tears noted. IV catheter discontinued intact. Site without signs and symptoms of complications. Dressing and pressure applied. Pt denies pain at this time. No complaints noted.  An After Visit Summary was printed and given to the patient. Patient escorted via WC, and D/C home via private auto.  Trish FountainHubbard, Laterria Lasota C 10/03/2015 6:48 PM

## 2015-10-03 NOTE — Progress Notes (Signed)
Physical Therapy Treatment Patient Details Name: Eric Berger MRN: [id number] DOB: 19-Mar-1926 Today's Date: 10/03/2015    History of Present Illness 82 y.o. male with history of hypertension, stroke, hyperlipidemia, GERD and hernia brought to the ER after patient had sudden onset of multiple episodes of nausea vomiting and productive cough for last for 5 days. Per MD note pt found to be febrile and chest x-ray shows possible infiltrates concerning for pneumonia.    PT Comments    Pt much improved from functional stand point. Pt SpO2 > 94% on RA during amb. Pt requires RW for safe ambulation as pt requires min/modA to prevent fall during ambulation without AD. Pt reports wife and daughter at home 24/7 and wife can drive to get groceries. Pt safe to d/c home with HHPT and use of RW as he is functioning at min guard level and SpO2 staying above 88%. Acute PT to con't to follow to progress independence with mobility. Recommend OT order to address ADLs.  SATURATION QUALIFICATIONS: (This note is used to comply with regulatory documentation for home oxygen)  Patient Saturations on Room Air at Rest = 96%  Patient Saturations on Room Air while Ambulating = 94%     Follow Up Recommendations  Home health PT;Supervision/Assistance - 24 hour     Equipment Recommendations  Rolling walker with 5" wheels    Recommendations for Other Services OT consult     Precautions / Restrictions Precautions Precautions: Fall Restrictions Weight Bearing Restrictions: No    Mobility  Bed Mobility Overal bed mobility: Needs Assistance Bed Mobility: Supine to Sit     Supine to sit: Modified independent (Device/Increase time)     General bed mobility comments: used bed rail, HOB flat, increased time but no physical assist required  Transfers Overall transfer level: Needs assistance Equipment used: Rolling walker (2 wheeled) Transfers: Sit to/from Stand Sit to Stand: Supervision          General transfer comment: v/c's for hand placement but no physical assistant  Ambulation/Gait Ambulation/Gait assistance: Min guard Ambulation Distance (Feet): 200 Feet Assistive device: Rolling walker (2 wheeled);None Gait Pattern/deviations: Step-through pattern Gait velocity: decreased   General Gait Details: pt with good walker management and no episodes of LOB. attempted to ambulate without RW however pt extremely unsteady and required min/modA to maintain balance. pt staggering L/R and unsafe. pt requires RW for safe amb.   Stairs            Wheelchair Mobility    Modified Rankin (Stroke Patients Only)       Balance           Standing balance support: No upper extremity supported Standing balance-Leahy Scale: Fair                      Cognition Arousal/Alertness: Awake/alert Behavior During Therapy: WFL for tasks assessed/performed Overall Cognitive Status: Within Functional Limits for tasks assessed                 General Comments: pt extremely HOH    Exercises      General Comments        Pertinent Vitals/Pain Pain Assessment: No/denies pain    Home Living                      Prior Function            PT Goals (current goals can now be found in the care plan section) Acute  Rehab PT Goals Patient Stated Goal: go home Progress towards PT goals: Progressing toward goals    Frequency  Min 3X/week    PT Plan Discharge plan needs to be updated    Co-evaluation             End of Session Equipment Utilized During Treatment: Gait belt Activity Tolerance: Patient tolerated treatment well Patient left: in chair;with call bell/phone within reach;with chair alarm set;with family/visitor present     Time: 1000-1030 PT Time Calculation (min) (ACUTE ONLY): 30 min  Charges:  $Gait Training: 23-37 mins                    G Codes:      Eric Berger 10/03/2015, 11:39 AM  Eric Berger, PT,  DPT Pager #: [phone] Office #: [phone]

## 2015-10-03 NOTE — Care Management Important Message (Signed)
Important Message  Patient Details  Name: Eric Berger MRN: [id number] Date of Birth: 1926-02-29   Medicare Important Message Given:  Yes    Faige Seely, Annamarie Majorheryl U, RN 10/03/2015, 2:13 PM

## 2015-10-03 NOTE — Progress Notes (Signed)
Occupational Therapy Evaluation Patient Details Name: Eric Berger MRN: [id number] DOB: 1925-12-26 Today's Date: 10/03/2015    History of Present Illness 82 y.o. male with history of hypertension, stroke, hyperlipidemia, GERD and hernia brought to the ER after patient had sudden onset of multiple episodes of nausea vomiting and productive cough for last for 5 days. Per MD note pt found to be febrile and chest x-ray shows possible infiltrates concerning for pneumonia.   Clinical Impression   PTA, pt independent with ADL and mobility. Pt completed ADL session standing at sink and toilet transfer with S only. O2 98 RA. HR 76. No LOB during functional tasks. Feel pt is safe to D/C home with intermittent S and HHOT. Pt expressing concerns over wife being sick. Discussed with CM. Will follow acutely.     Follow Up Recommendations  Home health OT;Supervision/Assistance - 24 hour    Equipment Recommendations  None recommended by OT    Recommendations for Other Services       Precautions / Restrictions Precautions Precautions: Fall Restrictions Weight Bearing Restrictions: No      Mobility Bed Mobility Overal bed mobility: Modified Independent                Transfers Overall transfer level: Needs assistance Equipment used: Rolling walker (2 wheeled) Transfers: Sit to/from [employer] Transfers Sit to Stand: Supervision Stand pivot transfers: Supervision            Balance Overall balance assessment: Needs assistance         Standing balance support: During functional activity Standing balance-Leahy Scale: Fair                              ADL Overall ADL's : Needs assistance/impaired   Eating/Feeding Details (indicate cue type and reason): dys 3 diet with honey thick liquids Grooming: Set up;Standing   Upper Body Bathing: Set up;Standing   Lower Body Bathing: Set up;Sit to/from stand   Upper Body Dressing : Set up   Lower Body  Dressing: Set up;Sit to/from stand   Toilet Transfer: Supervision/safety   Toileting- Architect and Hygiene: Supervision/safety       Functional mobility during ADLs: Supervision/safety;Rolling walker General ADL Comments: Pt completed ADL session with overall set up. Able to retrieve items from floor. Good balance during ADL. O2 Sats 98 RA     Vision     Perception     Praxis      Pertinent Vitals/Pain Pain Assessment: No/denies pain     Hand Dominance Right   Extremity/Trunk Assessment Upper Extremity Assessment Upper Extremity Assessment: Overall WFL for tasks assessed   Lower Extremity Assessment Lower Extremity Assessment: Defer to PT evaluation   Cervical / Trunk Assessment Cervical / Trunk Assessment: Normal   Communication Communication Communication: HOH   Cognition Arousal/Alertness: Awake/alert Behavior During Therapy: WFL for tasks assessed/performed Overall Cognitive Status: Within Functional Limits for tasks assessed                     General Comments   talking throughout session. No dyspnea noted.    Exercises       Shoulder Instructions      Home Living Family/patient expects to be discharged to:: Private residence Living Arrangements: Spouse/significant other;Children Available Help at Discharge: Family;Available 24 hours/day Type of Home: House Home Access: Stairs to enter [employer] of Steps: 3 Entrance Stairs-Rails: Right;Left Home Layout: One level  Bathroom Shower/Tub: Therapist, sportsTub/shower unit Shower/tub characteristics: Engineer, building servicesCurtain Bathroom Toilet: Standard Bathroom Accessibility: Yes How Accessible: Accessible via walker Home Equipment: Shower seat;Bedside commode;Walker - 2 wheels;Hospital bed;Cane - single point          Prior Functioning/Environment Level of Independence: Independent             OT Diagnosis: Generalized weakness   OT Problem List: Decreased activity  tolerance;Cardiopulmonary status limiting activity;Decreased knowledge of use of DME or AE   OT Treatment/Interventions: Self-care/ADL training;Therapeutic exercise;DME and/or AE instruction;Energy conservation;Therapeutic activities;Patient/family education    OT Goals(Current goals can be found in the care plan section) Acute Rehab OT Goals Patient Stated Goal: go home OT Goal Formulation: With patient Time For Goal Achievement: 10/10/15 Potential to Achieve Goals: Good  OT Frequency: Min 2X/week   Barriers to D/C: Decreased caregiver support  wife has a cold and daughter has "all kinds of problems"       Co-evaluation              End of Session Equipment Utilized During Treatment: Rolling walker Nurse Communication: Mobility status  Activity Tolerance: Patient tolerated treatment well Patient left: in bed;with call bell/phone within reach;with bed alarm set   Time: [id number] OT Time Calculation (min): 34 min Charges:  OT General Charges $OT Visit: 1 Procedure OT Evaluation $OT Eval Moderate Complexity: 1 Procedure OT Treatments $Self Care/Home Management : 8-22 mins G-Codes:    Alisandra Son,HILLARY 10/03/2015, 4:18 PM   [facility] Keaisha Sublette, OTR/L  [phone] 10/03/2015

## 2015-10-04 NOTE — Patient Outreach (Signed)
Telephone call made to offer patient [facility] services. Spoke with patient's wife who states that her husband has home health and she does not feel like Eric Berger needs any additional services. Explained in detail that patient is eligible for these [facility] additional services. Mrs. [name] continues to politely decline but expresses appreciation of the call.   Eric NobleAtika Donalyn Schneeberger, MSN-Ed, RN,BSN [facility] Care Management Hospital Liaison [phone]

## 2015-10-05 DIAGNOSIS — I340 Nonrheumatic mitral (valve) insufficiency: Secondary | ICD-10-CM | POA: Diagnosis not present

## 2015-10-05 DIAGNOSIS — K219 Gastro-esophageal reflux disease without esophagitis: Secondary | ICD-10-CM | POA: Diagnosis not present

## 2015-10-05 DIAGNOSIS — A419 Sepsis, unspecified organism: Secondary | ICD-10-CM | POA: Diagnosis not present

## 2015-10-05 DIAGNOSIS — G9340 Encephalopathy, unspecified: Secondary | ICD-10-CM | POA: Diagnosis not present

## 2015-10-05 DIAGNOSIS — J189 Pneumonia, unspecified organism: Secondary | ICD-10-CM | POA: Diagnosis not present

## 2015-10-05 DIAGNOSIS — I110 Hypertensive heart disease with heart failure: Secondary | ICD-10-CM | POA: Diagnosis not present

## 2015-10-05 DIAGNOSIS — N179 Acute kidney failure, unspecified: Secondary | ICD-10-CM | POA: Diagnosis not present

## 2015-10-05 DIAGNOSIS — R1310 Dysphagia, unspecified: Secondary | ICD-10-CM | POA: Diagnosis not present

## 2015-10-05 DIAGNOSIS — I5033 Acute on chronic diastolic (congestive) heart failure: Secondary | ICD-10-CM | POA: Diagnosis not present

## 2015-10-06 DIAGNOSIS — G9340 Encephalopathy, unspecified: Secondary | ICD-10-CM | POA: Diagnosis not present

## 2015-10-06 DIAGNOSIS — N179 Acute kidney failure, unspecified: Secondary | ICD-10-CM | POA: Diagnosis not present

## 2015-10-06 DIAGNOSIS — I110 Hypertensive heart disease with heart failure: Secondary | ICD-10-CM | POA: Diagnosis not present

## 2015-10-06 DIAGNOSIS — J189 Pneumonia, unspecified organism: Secondary | ICD-10-CM | POA: Diagnosis not present

## 2015-10-06 DIAGNOSIS — I340 Nonrheumatic mitral (valve) insufficiency: Secondary | ICD-10-CM | POA: Diagnosis not present

## 2015-10-06 DIAGNOSIS — K219 Gastro-esophageal reflux disease without esophagitis: Secondary | ICD-10-CM | POA: Diagnosis not present

## 2015-10-06 DIAGNOSIS — I5033 Acute on chronic diastolic (congestive) heart failure: Secondary | ICD-10-CM | POA: Diagnosis not present

## 2015-10-06 DIAGNOSIS — R1310 Dysphagia, unspecified: Secondary | ICD-10-CM | POA: Diagnosis not present

## 2015-10-06 DIAGNOSIS — A419 Sepsis, unspecified organism: Secondary | ICD-10-CM | POA: Diagnosis not present

## 2015-10-08 DIAGNOSIS — K219 Gastro-esophageal reflux disease without esophagitis: Secondary | ICD-10-CM | POA: Diagnosis not present

## 2015-10-08 DIAGNOSIS — J189 Pneumonia, unspecified organism: Secondary | ICD-10-CM | POA: Diagnosis not present

## 2015-10-08 DIAGNOSIS — N179 Acute kidney failure, unspecified: Secondary | ICD-10-CM | POA: Diagnosis not present

## 2015-10-08 DIAGNOSIS — I110 Hypertensive heart disease with heart failure: Secondary | ICD-10-CM | POA: Diagnosis not present

## 2015-10-08 DIAGNOSIS — G9340 Encephalopathy, unspecified: Secondary | ICD-10-CM | POA: Diagnosis not present

## 2015-10-08 DIAGNOSIS — I340 Nonrheumatic mitral (valve) insufficiency: Secondary | ICD-10-CM | POA: Diagnosis not present

## 2015-10-08 DIAGNOSIS — I5033 Acute on chronic diastolic (congestive) heart failure: Secondary | ICD-10-CM | POA: Diagnosis not present

## 2015-10-08 DIAGNOSIS — A419 Sepsis, unspecified organism: Secondary | ICD-10-CM | POA: Diagnosis not present

## 2015-10-08 DIAGNOSIS — R1310 Dysphagia, unspecified: Secondary | ICD-10-CM | POA: Diagnosis not present

## 2015-10-09 DIAGNOSIS — K219 Gastro-esophageal reflux disease without esophagitis: Secondary | ICD-10-CM | POA: Diagnosis not present

## 2015-10-09 DIAGNOSIS — G9340 Encephalopathy, unspecified: Secondary | ICD-10-CM | POA: Diagnosis not present

## 2015-10-09 DIAGNOSIS — I340 Nonrheumatic mitral (valve) insufficiency: Secondary | ICD-10-CM | POA: Diagnosis not present

## 2015-10-09 DIAGNOSIS — R1310 Dysphagia, unspecified: Secondary | ICD-10-CM | POA: Diagnosis not present

## 2015-10-09 DIAGNOSIS — I110 Hypertensive heart disease with heart failure: Secondary | ICD-10-CM | POA: Diagnosis not present

## 2015-10-09 DIAGNOSIS — N179 Acute kidney failure, unspecified: Secondary | ICD-10-CM | POA: Diagnosis not present

## 2015-10-09 DIAGNOSIS — I5033 Acute on chronic diastolic (congestive) heart failure: Secondary | ICD-10-CM | POA: Diagnosis not present

## 2015-10-09 DIAGNOSIS — J189 Pneumonia, unspecified organism: Secondary | ICD-10-CM | POA: Diagnosis not present

## 2015-10-09 DIAGNOSIS — A419 Sepsis, unspecified organism: Secondary | ICD-10-CM | POA: Diagnosis not present

## 2015-10-10 DIAGNOSIS — N179 Acute kidney failure, unspecified: Secondary | ICD-10-CM | POA: Diagnosis not present

## 2015-10-10 DIAGNOSIS — K219 Gastro-esophageal reflux disease without esophagitis: Secondary | ICD-10-CM | POA: Diagnosis not present

## 2015-10-10 DIAGNOSIS — I110 Hypertensive heart disease with heart failure: Secondary | ICD-10-CM | POA: Diagnosis not present

## 2015-10-10 DIAGNOSIS — J189 Pneumonia, unspecified organism: Secondary | ICD-10-CM | POA: Diagnosis not present

## 2015-10-10 DIAGNOSIS — I340 Nonrheumatic mitral (valve) insufficiency: Secondary | ICD-10-CM | POA: Diagnosis not present

## 2015-10-10 DIAGNOSIS — R1310 Dysphagia, unspecified: Secondary | ICD-10-CM | POA: Diagnosis not present

## 2015-10-10 DIAGNOSIS — A419 Sepsis, unspecified organism: Secondary | ICD-10-CM | POA: Diagnosis not present

## 2015-10-10 DIAGNOSIS — I5033 Acute on chronic diastolic (congestive) heart failure: Secondary | ICD-10-CM | POA: Diagnosis not present

## 2015-10-10 DIAGNOSIS — G9340 Encephalopathy, unspecified: Secondary | ICD-10-CM | POA: Diagnosis not present

## 2015-10-11 ENCOUNTER — Encounter: Payer: Self-pay | Admitting: Family Medicine

## 2015-10-11 ENCOUNTER — Ambulatory Visit (INDEPENDENT_AMBULATORY_CARE_PROVIDER_SITE_OTHER): Payer: Commercial Managed Care - HMO | Admitting: Family Medicine

## 2015-10-11 VITALS — BP 122/60 | HR 82 | Temp 97.4°F | Resp 20 | Ht 64.0 in | Wt 151.0 lb

## 2015-10-11 DIAGNOSIS — I10 Essential (primary) hypertension: Secondary | ICD-10-CM | POA: Diagnosis not present

## 2015-10-11 DIAGNOSIS — Z09 Encounter for follow-up examination after completed treatment for conditions other than malignant neoplasm: Secondary | ICD-10-CM | POA: Diagnosis not present

## 2015-10-11 NOTE — Progress Notes (Signed)
Subjective:    Patient ID: MONICA ZAHLER, male    DOB: Dec 11, 1925, 82 y.o.   MRN: [id number]  HPI Was recently admitted to the hospital with CAP.  I have copied relevant portions of the discharge summary and included them below for my reference: Admit date: 09/23/2015 Discharge date: 10/03/2015  Time spent: 55 minutes  Recommendations for Outpatient Follow-up:  1. Outpatient speech therapy follow-up  Discharge Condition: Stable  Discharge Diagnoses:  Principal Problem:  CAP (community acquired pneumonia) Active Problems:  HTN (hypertension)  Acute encephalopathy  ARF (acute renal failure) (HCC)  Nausea with vomiting  Sepsis (HCC)  Mitral regurgitation  Acute on chronic diastolic heart failure (HCC)   History of present illness:  82 year old male with a history of hypertension, CVA, hyperlipidemia who was brought to the ER after multiple episodes of vomiting. The patient had been having a productive cough for about 5 days. He was noted to be febrile and had a chest x-ray suggestive of infiltrates concerning for pneumonia. The patient was admitted and started on IV antibiotics.  Hospital Course:  Pneumonia-possibly aspiration/ sepsis -Treated with a 10 day course of antibiotics-initially given Rocephin and Zithromax and subsequently changed over to Unasyn and then Augmentin -Flu PCR negative, Legionella and streptococcal antigens negative - chest x-ray today reveals clearing of infiltrates -Has ambulated in the hall today with minimal assistance and oxygen saturations are noted to be close to 95% on room air -Has been on high-dose steroids for wheezing and will go home with a prednisone taper  Dysphagia -Swallow eval and MBS reveals that he has significant dysphagia and honey thick liquids are recommended -Would recommend he follow up with outpatient speech therapy  Acute on chronic diastolic heart failure -Is likely secondary to fluid overload related to IV  fluids and resolved with Lasix  Acute encephalopathy -Per wife, the patient became confused after fever started -Likely secondary to above infection and has improved  Acute renal failure on admission - Improved with hydration  Procedures:  Modified barium swallow  ECHO:   Left ventricle: The cavity size was normal. Systolic function was normal. The estimated ejection fraction was in the range of 55% to 60%. Wall motion was normal; there were no regional wall motion abnormalities. - Aortic valve: Trileaflet; normal thickness, mildly calcified leaflets. - Mitral valve: There was moderate regurgitation. - Left atrium: The atrium was moderately dilated. - Right atrium: The atrium was mildly dilated. - Tricuspid valve: There was mild-moderate regurgitation. - Pulmonary arteries: Systolic pressure was moderately increased. PA peak pressure: 56 mm Hg (S).  He is here today for follow up.  He is living at home. He never went to rehabilitation facility. However he is ambulating around the house without difficulty. He is on a honey thickened liquid diet. The hospital is recommended a follow-up with speech therapy. I like to give the patient a month to recover and then recheck a swallowing test. It is possible that he failed the swallow study due to his delirium from his pneumonia however today on examination the patient is wheezing. He discontinued Combivent due to cost. He has finished his prednisone as well as antibiotics. Patient has a 17-pack-year history of smoking but he quit 50 years ago. He denies any history of asthma. However he is wheezing audibly on exam today Past Medical History  Diagnosis Date  . Stroke (HCC)   . Renal disorder   . Hernia   . High cholesterol   . Hearing loss   .  GERD (gastroesophageal reflux disease)   . Cataracts, bilateral   . Hypertension    Past Surgical History  Procedure Laterality Date  . Cholecystectomy    . Hernia repair    . Eye  surgery     Current Outpatient Prescriptions on File Prior to Visit  Medication Sig Dispense Refill  . albuterol-ipratropium (COMBIVENT) 18-103 MCG/ACT inhaler Inhale 2 puffs into the lungs 4 (four) times daily. 1 Inhaler 0  . aspirin 325 MG tablet Take 325 mg by mouth daily.    . cholecalciferol (VITAMIN D) 1000 UNITS tablet Take 1,000 Units by mouth daily.    . Cinnamon 500 MG capsule Take 500 mg by mouth daily.    Marland Kitchen GARLIC PO Take 1 tablet by mouth daily.    Marland Kitchen glucosamine-chondroitin 500-400 MG tablet Take 1 tablet by mouth 3 (three) times daily.    Marland Kitchen guaiFENesin (MUCINEX) 600 MG 12 hr tablet Take 1 tablet (600 mg total) by mouth 2 (two) times daily. 30 tablet 0  . lansoprazole (PREVACID) 15 MG capsule Take 15 mg by mouth daily at 12 noon.    Marland Kitchen losartan (COZAAR) 50 MG tablet Take 1 tablet (50 mg total) by mouth daily. 30 tablet 5  . Maltodextrin-Xanthan Gum (RESOURCE THICKENUP CLEAR) POWD Take 1 g by mouth as needed. 125 g 0  . OVER THE COUNTER MEDICATION Mixes vinegar and grape juice in small glass and drinks at night-for metabolism    . predniSONE (DELTASONE) 10 MG tablet Take  daily for 3days,Take  daily for 3days,Take  daily for 3days,Take  daily for 3days,Take  daily for 3days,then stop 45 tablet 0  . saw palmetto 500 MG capsule Take 500 mg by mouth daily.    . simvastatin (ZOCOR) 40 MG tablet TAKE ONE TABLET BY MOUTH ONCE DAILY AT 6PM 90 tablet 0   No current facility-administered medications on file prior to visit.   No Known Allergies Social History   Social History  . Marital Status: Married    Spouse Name: N/A  . Number of Children: N/A  . Years of Education: N/A   Occupational History  . Not on file.   Social History Main Topics  . Smoking status: Former Smoker    Quit date: 06/08/1962  . Smokeless tobacco: Never Used  . Alcohol Use: No  . Drug Use: No  . Sexual Activity: Yes     Comment: Married.  Retired.   Other Topics Concern  . Not on  file   Social History Narrative      Review of Systems  All other systems reviewed and are negative.      Objective:   Physical Exam  Constitutional: He appears well-developed and well-nourished. No distress.  Cardiovascular: Normal rate, regular rhythm and normal heart sounds.   Pulmonary/Chest: Effort normal. He has wheezes. He has no rales.  Abdominal: Soft. Bowel sounds are normal. He exhibits no distension. There is no tenderness. There is no rebound and no guarding.  Musculoskeletal: He exhibits no edema.  Skin: He is not diaphoretic.  Vitals reviewed.         Assessment & Plan:  Hospital discharge follow-up - Plan: COMPLETE METABOLIC PANEL WITH GFR, CBC with Differential/Platelet  Patient has clinically improved but I'm concerned by his wheezing. I have recommended that he start anoro one inhalation daily and recheck in 2 weeks. At that time I'll perform pulmonary function test to evaluate for signs of obstructive lung disease. I like to repeat a chest x-ray  in one month. Once the patient has clinically improved, I would repeat a swallowing study to see if he is still aspirating. Hopefully as aspirating secondary to delirium from his community-acquired pneumonia. However at the present time I recommended he continue the honey thickened liquids. I will recheck a CMP and a CBC today

## 2015-10-12 LAB — CBC WITH DIFFERENTIAL/PLATELET
BASOS PCT: 0 % (ref 0–1)
Basophils Absolute: 0 10*3/uL (ref 0.0–0.1)
Eosinophils Absolute: 0 10*3/uL (ref 0.0–0.7)
Eosinophils Relative: 0 % (ref 0–5)
HEMATOCRIT: 45 % (ref 39.0–52.0)
HEMOGLOBIN: 14.7 g/dL (ref 13.0–17.0)
LYMPHS PCT: 6 % — AB (ref 12–46)
Lymphs Abs: 0.7 10*3/uL (ref 0.7–4.0)
MCH: 31.1 pg (ref 26.0–34.0)
MCHC: 32.7 g/dL (ref 30.0–36.0)
MCV: 95.1 fL (ref 78.0–100.0)
MONO ABS: 0.3 10*3/uL (ref 0.1–1.0)
MONOS PCT: 3 % (ref 3–12)
MPV: 10.8 fL (ref 8.6–12.4)
NEUTROS ABS: 10 10*3/uL — AB (ref 1.7–7.7)
Neutrophils Relative %: 91 % — ABNORMAL HIGH (ref 43–77)
Platelets: 364 10*3/uL (ref 150–400)
RBC: 4.73 MIL/uL (ref 4.22–5.81)
RDW: 13.9 % (ref 11.5–15.5)
WBC: 11 10*3/uL — ABNORMAL HIGH (ref 4.0–10.5)

## 2015-10-12 LAB — COMPLETE METABOLIC PANEL WITH GFR
ALBUMIN: 3.6 g/dL (ref 3.6–5.1)
ALK PHOS: 72 U/L (ref 40–115)
ALT: 61 U/L — ABNORMAL HIGH (ref 9–46)
AST: 20 U/L (ref 10–35)
BUN: 26 mg/dL — AB (ref 7–25)
CALCIUM: 8.7 mg/dL (ref 8.6–10.3)
CO2: 26 mmol/L (ref 20–31)
CREATININE: 0.94 mg/dL (ref 0.70–1.11)
Chloride: 100 mmol/L (ref 98–110)
GFR, EST AFRICAN AMERICAN: 84 mL/min (ref >=60)
GFR, Est Non African American: 73 mL/min (ref >=60)
GLUCOSE: 155 mg/dL — AB (ref 70–99)
POTASSIUM: 4.5 mmol/L (ref 3.5–5.3)
Sodium: 137 mmol/L (ref 135–146)
Total Bilirubin: 0.7 mg/dL (ref 0.2–1.2)
Total Protein: 6.2 g/dL (ref 6.1–8.1)

## 2015-10-17 DIAGNOSIS — K219 Gastro-esophageal reflux disease without esophagitis: Secondary | ICD-10-CM | POA: Diagnosis not present

## 2015-10-17 DIAGNOSIS — A419 Sepsis, unspecified organism: Secondary | ICD-10-CM | POA: Diagnosis not present

## 2015-10-17 DIAGNOSIS — I340 Nonrheumatic mitral (valve) insufficiency: Secondary | ICD-10-CM | POA: Diagnosis not present

## 2015-10-17 DIAGNOSIS — N179 Acute kidney failure, unspecified: Secondary | ICD-10-CM | POA: Diagnosis not present

## 2015-10-17 DIAGNOSIS — J189 Pneumonia, unspecified organism: Secondary | ICD-10-CM | POA: Diagnosis not present

## 2015-10-17 DIAGNOSIS — I110 Hypertensive heart disease with heart failure: Secondary | ICD-10-CM | POA: Diagnosis not present

## 2015-10-17 DIAGNOSIS — R1310 Dysphagia, unspecified: Secondary | ICD-10-CM | POA: Diagnosis not present

## 2015-10-17 DIAGNOSIS — I5033 Acute on chronic diastolic (congestive) heart failure: Secondary | ICD-10-CM | POA: Diagnosis not present

## 2015-10-17 DIAGNOSIS — G9340 Encephalopathy, unspecified: Secondary | ICD-10-CM | POA: Diagnosis not present

## 2015-10-18 DIAGNOSIS — I340 Nonrheumatic mitral (valve) insufficiency: Secondary | ICD-10-CM | POA: Diagnosis not present

## 2015-10-18 DIAGNOSIS — J189 Pneumonia, unspecified organism: Secondary | ICD-10-CM | POA: Diagnosis not present

## 2015-10-18 DIAGNOSIS — I5033 Acute on chronic diastolic (congestive) heart failure: Secondary | ICD-10-CM | POA: Diagnosis not present

## 2015-10-18 DIAGNOSIS — R1310 Dysphagia, unspecified: Secondary | ICD-10-CM | POA: Diagnosis not present

## 2015-10-18 DIAGNOSIS — I110 Hypertensive heart disease with heart failure: Secondary | ICD-10-CM | POA: Diagnosis not present

## 2015-10-18 DIAGNOSIS — N179 Acute kidney failure, unspecified: Secondary | ICD-10-CM | POA: Diagnosis not present

## 2015-10-18 DIAGNOSIS — A419 Sepsis, unspecified organism: Secondary | ICD-10-CM | POA: Diagnosis not present

## 2015-10-18 DIAGNOSIS — G9340 Encephalopathy, unspecified: Secondary | ICD-10-CM | POA: Diagnosis not present

## 2015-10-18 DIAGNOSIS — K219 Gastro-esophageal reflux disease without esophagitis: Secondary | ICD-10-CM | POA: Diagnosis not present

## 2015-10-19 DIAGNOSIS — I340 Nonrheumatic mitral (valve) insufficiency: Secondary | ICD-10-CM | POA: Diagnosis not present

## 2015-10-19 DIAGNOSIS — J189 Pneumonia, unspecified organism: Secondary | ICD-10-CM | POA: Diagnosis not present

## 2015-10-19 DIAGNOSIS — I5033 Acute on chronic diastolic (congestive) heart failure: Secondary | ICD-10-CM | POA: Diagnosis not present

## 2015-10-19 DIAGNOSIS — K219 Gastro-esophageal reflux disease without esophagitis: Secondary | ICD-10-CM | POA: Diagnosis not present

## 2015-10-19 DIAGNOSIS — R1310 Dysphagia, unspecified: Secondary | ICD-10-CM | POA: Diagnosis not present

## 2015-10-19 DIAGNOSIS — A419 Sepsis, unspecified organism: Secondary | ICD-10-CM | POA: Diagnosis not present

## 2015-10-19 DIAGNOSIS — I110 Hypertensive heart disease with heart failure: Secondary | ICD-10-CM | POA: Diagnosis not present

## 2015-10-19 DIAGNOSIS — N179 Acute kidney failure, unspecified: Secondary | ICD-10-CM | POA: Diagnosis not present

## 2015-10-19 DIAGNOSIS — G9340 Encephalopathy, unspecified: Secondary | ICD-10-CM | POA: Diagnosis not present

## 2015-10-24 DIAGNOSIS — I110 Hypertensive heart disease with heart failure: Secondary | ICD-10-CM | POA: Diagnosis not present

## 2015-10-24 DIAGNOSIS — N179 Acute kidney failure, unspecified: Secondary | ICD-10-CM | POA: Diagnosis not present

## 2015-10-24 DIAGNOSIS — R1310 Dysphagia, unspecified: Secondary | ICD-10-CM | POA: Diagnosis not present

## 2015-10-24 DIAGNOSIS — I340 Nonrheumatic mitral (valve) insufficiency: Secondary | ICD-10-CM | POA: Diagnosis not present

## 2015-10-24 DIAGNOSIS — J189 Pneumonia, unspecified organism: Secondary | ICD-10-CM | POA: Diagnosis not present

## 2015-10-24 DIAGNOSIS — A419 Sepsis, unspecified organism: Secondary | ICD-10-CM | POA: Diagnosis not present

## 2015-10-24 DIAGNOSIS — K219 Gastro-esophageal reflux disease without esophagitis: Secondary | ICD-10-CM | POA: Diagnosis not present

## 2015-10-24 DIAGNOSIS — G9340 Encephalopathy, unspecified: Secondary | ICD-10-CM | POA: Diagnosis not present

## 2015-10-24 DIAGNOSIS — I5033 Acute on chronic diastolic (congestive) heart failure: Secondary | ICD-10-CM | POA: Diagnosis not present

## 2015-10-25 DIAGNOSIS — I340 Nonrheumatic mitral (valve) insufficiency: Secondary | ICD-10-CM | POA: Diagnosis not present

## 2015-10-25 DIAGNOSIS — R1310 Dysphagia, unspecified: Secondary | ICD-10-CM | POA: Diagnosis not present

## 2015-10-25 DIAGNOSIS — N179 Acute kidney failure, unspecified: Secondary | ICD-10-CM | POA: Diagnosis not present

## 2015-10-25 DIAGNOSIS — I110 Hypertensive heart disease with heart failure: Secondary | ICD-10-CM | POA: Diagnosis not present

## 2015-10-25 DIAGNOSIS — J189 Pneumonia, unspecified organism: Secondary | ICD-10-CM | POA: Diagnosis not present

## 2015-10-25 DIAGNOSIS — A419 Sepsis, unspecified organism: Secondary | ICD-10-CM | POA: Diagnosis not present

## 2015-10-25 DIAGNOSIS — K219 Gastro-esophageal reflux disease without esophagitis: Secondary | ICD-10-CM | POA: Diagnosis not present

## 2015-10-25 DIAGNOSIS — I5033 Acute on chronic diastolic (congestive) heart failure: Secondary | ICD-10-CM | POA: Diagnosis not present

## 2015-10-25 DIAGNOSIS — G9340 Encephalopathy, unspecified: Secondary | ICD-10-CM | POA: Diagnosis not present

## 2015-10-26 ENCOUNTER — Ambulatory Visit (INDEPENDENT_AMBULATORY_CARE_PROVIDER_SITE_OTHER): Payer: Commercial Managed Care - HMO | Admitting: Family Medicine

## 2015-10-26 ENCOUNTER — Encounter: Payer: Self-pay | Admitting: Family Medicine

## 2015-10-26 VITALS — BP 114/60 | HR 84 | Temp 98.6°F | Resp 18 | Wt 151.0 lb

## 2015-10-26 DIAGNOSIS — J189 Pneumonia, unspecified organism: Secondary | ICD-10-CM | POA: Diagnosis not present

## 2015-10-26 LAB — COMPLETE METABOLIC PANEL WITH GFR
ALBUMIN: 3.7 g/dL (ref 3.6–5.1)
ALT: 24 U/L (ref 9–46)
AST: 18 U/L (ref 10–35)
Alkaline Phosphatase: 61 U/L (ref 40–115)
BILIRUBIN TOTAL: 0.7 mg/dL (ref 0.2–1.2)
BUN: 27 mg/dL — ABNORMAL HIGH (ref 7–25)
CALCIUM: 9 mg/dL (ref 8.6–10.3)
CO2: 23 mmol/L (ref 20–31)
CREATININE: 1.1 mg/dL (ref 0.70–1.11)
Chloride: 102 mmol/L (ref 98–110)
GFR, EST NON AFRICAN AMERICAN: 60 mL/min (ref >=60)
GFR, Est African American: 69 mL/min (ref >=60)
GLUCOSE: 98 mg/dL (ref 70–99)
Potassium: 5.1 mmol/L (ref 3.5–5.3)
Sodium: 135 mmol/L (ref 135–146)
Total Protein: 6.3 g/dL (ref 6.1–8.1)

## 2015-10-26 NOTE — Progress Notes (Signed)
Subjective:    Patient ID: Eric Berger, male    DOB: 1926-04-23, 82 y.o.   MRN: [id number]  HPI  Was recently admitted to the hospital with CAP.  I have copied relevant portions of the discharge summary and included them below for my reference: Admit date: 09/23/2015 Discharge date: 10/03/2015  Time spent: 55 minutes  Recommendations for Outpatient Follow-up:  1. Outpatient speech therapy follow-up  Discharge Condition: Stable  Discharge Diagnoses:  Principal Problem:  CAP (community acquired pneumonia) Active Problems:  HTN (hypertension)  Acute encephalopathy  ARF (acute renal failure) (HCC)  Nausea with vomiting  Sepsis (HCC)  Mitral regurgitation  Acute on chronic diastolic heart failure (HCC)   History of present illness:  82 year old male with a history of hypertension, CVA, hyperlipidemia who was brought to the ER after multiple episodes of vomiting. The patient had been having a productive cough for about 5 days. He was noted to be febrile and had a chest x-ray suggestive of infiltrates concerning for pneumonia. The patient was admitted and started on IV antibiotics.  Hospital Course:  Pneumonia-possibly aspiration/ sepsis -Treated with a 10 day course of antibiotics-initially given Rocephin and Zithromax and subsequently changed over to Unasyn and then Augmentin -Flu PCR negative, Legionella and streptococcal antigens negative - chest x-ray today reveals clearing of infiltrates -Has ambulated in the hall today with minimal assistance and oxygen saturations are noted to be close to 95% on room air -Has been on high-dose steroids for wheezing and will go home with a prednisone taper  Dysphagia -Swallow eval and MBS reveals that he has significant dysphagia and honey thick liquids are recommended -Would recommend he follow up with outpatient speech therapy  Acute on chronic diastolic heart failure -Is likely secondary to fluid overload related to IV  fluids and resolved with Lasix  Acute encephalopathy -Per wife, the patient became confused after fever started -Likely secondary to above infection and has improved  Acute renal failure on admission - Improved with hydration  Procedures:  Modified barium swallow  ECHO:   Left ventricle: The cavity size was normal. Systolic function was normal. The estimated ejection fraction was in the range of 55% to 60%. Wall motion was normal; there were no regional wall motion abnormalities. - Aortic valve: Trileaflet; normal thickness, mildly calcified leaflets. - Mitral valve: There was moderate regurgitation. - Left atrium: The atrium was moderately dilated. - Right atrium: The atrium was mildly dilated. - Tricuspid valve: There was mild-moderate regurgitation. - Pulmonary arteries: Systolic pressure was moderately increased. PA peak pressure: 56 mm Hg (S).  He is here today for follow up.  He is living at home. He never went to rehabilitation facility. However he is ambulating around the house without difficulty. He is on a honey thickened liquid diet. The hospital is recommended a follow-up with speech therapy. I like to give the patient a month to recover and then recheck a swallowing test. It is possible that he failed the swallow study due to his delirium from his pneumonia however today on examination the patient is wheezing. He discontinued Combivent due to cost. He has finished his prednisone as well as antibiotics. Patient has a 17-pack-year history of smoking but he quit 50 years ago. He denies any history of asthma. However he is wheezing audibly on exam today.  At that time,my plan was: Patient has clinically improved but I'm concerned by his wheezing. I have recommended that he start anoro one inhalation daily and recheck in 2  weeks. At that time I'll perform pulmonary function test to evaluate for signs of obstructive lung disease. I like to repeat a chest x-ray in one  month. Once the patient has clinically improved, I would repeat a swallowing study to see if he is still aspirating. Hopefully as aspirating secondary to delirium from his community-acquired pneumonia. However at the present time I recommended he continue the honey thickened liquids. I will recheck a CMP and a CBC today.  10/26/15 Patient is here today for follow-up. His lungs sound much better. However he does not believe that the anoro is helping at all. However on his exam there are no wheezes whatsoever. I am very happy that his breathing is back to his baseline. However I would like to obtain pulmonary function test to rule out underlying obstructive pulmonary disease. He has a 17-pack-year history of smoking and he recently required high-dose steroids and inhalers to treat obstructive symptoms when he had his community-acquired pneumonia.  Also on the lab work I obtained last week there was mild elevations in his liver function test. I will repeat that today. If still elevated we may need to discontinue simvastatin. Past Medical History  Diagnosis Date  . Stroke (HCC)   . Renal disorder   . Hernia   . High cholesterol   . Hearing loss   . GERD (gastroesophageal reflux disease)   . Cataracts, bilateral   . Hypertension    Past Surgical History  Procedure Laterality Date  . Cholecystectomy    . Hernia repair    . Eye surgery     Current Outpatient Prescriptions on File Prior to Visit  Medication Sig Dispense Refill  . aspirin 325 MG tablet Take 325 mg by mouth daily.    . cholecalciferol (VITAMIN D) 1000 UNITS tablet Take 1,000 Units by mouth daily.    . Cinnamon 500 MG capsule Take 500 mg by mouth daily.    Marland Kitchen GARLIC PO Take 1 tablet by mouth daily.    Marland Kitchen glucosamine-chondroitin 500-400 MG tablet Take 1 tablet by mouth 3 (three) times daily.    Marland Kitchen losartan (COZAAR) 50 MG tablet Take 1 tablet (50 mg total) by mouth daily. 30 tablet 5  . Maltodextrin-Xanthan Gum (RESOURCE THICKENUP CLEAR)  POWD Take 1 g by mouth as needed. 125 g 0  . saw palmetto 500 MG capsule Take 500 mg by mouth daily.    . simvastatin (ZOCOR) 40 MG tablet TAKE ONE TABLET BY MOUTH ONCE DAILY AT 6PM 90 tablet 0  . albuterol-ipratropium (COMBIVENT) 18-103 MCG/ACT inhaler Inhale 2 puffs into the lungs 4 (four) times daily. (Patient not taking: Reported on 10/26/2015) 1 Inhaler 0  . guaiFENesin (MUCINEX) 600 MG 12 hr tablet Take 1 tablet (600 mg total) by mouth 2 (two) times daily. (Patient not taking: Reported on 10/26/2015) 30 tablet 0  . lansoprazole (PREVACID) 15 MG capsule Take 15 mg by mouth daily at 12 noon. Reported on 10/26/2015    . OVER THE COUNTER MEDICATION Mixes vinegar and grape juice in small glass and drinks at night-for metabolism     No current facility-administered medications on file prior to visit.   No Known Allergies Social History   Social History  . Marital Status: Married    Spouse Name: N/A  . Number of Children: N/A  . Years of Education: N/A   Occupational History  . Not on file.   Social History Main Topics  . Smoking status: Former Smoker    Quit date:  06/08/1962  . Smokeless tobacco: Never Used  . Alcohol Use: No  . Drug Use: No  . Sexual Activity: Yes     Comment: Married.  Retired.   Other Topics Concern  . Not on file   Social History Narrative      Review of Systems  All other systems reviewed and are negative.      Objective:   Physical Exam  Constitutional: He appears well-developed and well-nourished. No distress.  Cardiovascular: Normal rate, regular rhythm and normal heart sounds.   Pulmonary/Chest: Effort normal. He has no wheezes. He has no rales.  Abdominal: Soft. Bowel sounds are normal. He exhibits no distension. There is no tenderness. There is no rebound and no guarding.  Musculoskeletal: He exhibits no edema.  Skin: He is not diaphoretic.  Vitals reviewed.         Assessment & Plan:  CAP (community acquired pneumonia)  On exam  today, the patient has clinically improved. He does not see any subjective improvement from the inhaler. Therefore I'll perform pulmonary function test. If normal I will discontinue the inhaler/anoro.  If obstructive lung disease is present, I would try to encourage him to continue it. I will also repeat a CMP to monitor his liver function test.  Recheck in one month to repeat a chest x-ray and obtain a follow-up swallowing study  Patient cannot understand directions enough to perform pulmonary function test. Therefore the results are inaccurate. Therefore I will have the patient discontinue anoro and recheck him in one month.

## 2015-10-29 ENCOUNTER — Other Ambulatory Visit: Payer: Self-pay | Admitting: Family Medicine

## 2015-10-31 DIAGNOSIS — R1310 Dysphagia, unspecified: Secondary | ICD-10-CM | POA: Diagnosis not present

## 2015-10-31 DIAGNOSIS — A419 Sepsis, unspecified organism: Secondary | ICD-10-CM | POA: Diagnosis not present

## 2015-10-31 DIAGNOSIS — K219 Gastro-esophageal reflux disease without esophagitis: Secondary | ICD-10-CM | POA: Diagnosis not present

## 2015-10-31 DIAGNOSIS — I5033 Acute on chronic diastolic (congestive) heart failure: Secondary | ICD-10-CM | POA: Diagnosis not present

## 2015-10-31 DIAGNOSIS — J189 Pneumonia, unspecified organism: Secondary | ICD-10-CM | POA: Diagnosis not present

## 2015-10-31 DIAGNOSIS — I110 Hypertensive heart disease with heart failure: Secondary | ICD-10-CM | POA: Diagnosis not present

## 2015-10-31 DIAGNOSIS — N179 Acute kidney failure, unspecified: Secondary | ICD-10-CM | POA: Diagnosis not present

## 2015-10-31 DIAGNOSIS — G9340 Encephalopathy, unspecified: Secondary | ICD-10-CM | POA: Diagnosis not present

## 2015-10-31 DIAGNOSIS — I340 Nonrheumatic mitral (valve) insufficiency: Secondary | ICD-10-CM | POA: Diagnosis not present

## 2015-11-14 DIAGNOSIS — K219 Gastro-esophageal reflux disease without esophagitis: Secondary | ICD-10-CM | POA: Diagnosis not present

## 2015-11-14 DIAGNOSIS — I5033 Acute on chronic diastolic (congestive) heart failure: Secondary | ICD-10-CM | POA: Diagnosis not present

## 2015-11-14 DIAGNOSIS — I340 Nonrheumatic mitral (valve) insufficiency: Secondary | ICD-10-CM | POA: Diagnosis not present

## 2015-11-14 DIAGNOSIS — J189 Pneumonia, unspecified organism: Secondary | ICD-10-CM | POA: Diagnosis not present

## 2015-11-14 DIAGNOSIS — A419 Sepsis, unspecified organism: Secondary | ICD-10-CM | POA: Diagnosis not present

## 2015-11-14 DIAGNOSIS — N179 Acute kidney failure, unspecified: Secondary | ICD-10-CM | POA: Diagnosis not present

## 2015-11-14 DIAGNOSIS — R1310 Dysphagia, unspecified: Secondary | ICD-10-CM | POA: Diagnosis not present

## 2015-11-14 DIAGNOSIS — I110 Hypertensive heart disease with heart failure: Secondary | ICD-10-CM | POA: Diagnosis not present

## 2015-11-14 DIAGNOSIS — G9340 Encephalopathy, unspecified: Secondary | ICD-10-CM | POA: Diagnosis not present

## 2015-11-22 DIAGNOSIS — H5210 Myopia, unspecified eye: Secondary | ICD-10-CM | POA: Diagnosis not present

## 2015-11-22 DIAGNOSIS — H524 Presbyopia: Secondary | ICD-10-CM | POA: Diagnosis not present

## 2015-11-23 ENCOUNTER — Encounter: Payer: Self-pay | Admitting: Family Medicine

## 2015-11-23 ENCOUNTER — Ambulatory Visit (INDEPENDENT_AMBULATORY_CARE_PROVIDER_SITE_OTHER): Payer: Commercial Managed Care - HMO | Admitting: Family Medicine

## 2015-11-23 VITALS — BP 138/70 | HR 74 | Temp 98.4°F | Resp 18 | Ht 64.0 in | Wt 148.0 lb

## 2015-11-23 DIAGNOSIS — R1310 Dysphagia, unspecified: Secondary | ICD-10-CM | POA: Diagnosis not present

## 2015-11-23 DIAGNOSIS — J189 Pneumonia, unspecified organism: Secondary | ICD-10-CM

## 2015-11-23 NOTE — Assessment & Plan Note (Signed)
Pt is eating liberal diet, he does still have some dificulty with thin liquid, takes meds with applesauce Will check repeat swallowing study. I think some of his dysphagia was directly related to his acutely ill state

## 2015-11-23 NOTE — Patient Instructions (Signed)
Continue current medications CXR to be done Swallowing to be done- we will call with results F/U 4 months - Dr. Tanya NonesPickard

## 2015-11-23 NOTE — Progress Notes (Signed)
Patient ID: Eric IvanWilliam H Berger, male   DOB: 08/22/1926, 82 y.o.   MRN: [id number]    Subjective:    Patient ID: Eric Berger, male    DOB: 08/22/1926, 82 y.o.   MRN: [id number]  Patient presents for 1 month F/U  Pt here for intermin f/u pneumonia and dysphagia. He was admitted admitted to the hospital in January please see previous PCP notes for community acquired pneumonia sepsis acute encephalopathy. This resolved with treatment. He has completed antibiotics for his pneumonia. He is due for follow-up chest x-ray. Of note he also is failed his swallow evaluation this was attempted though he was encephalopathic and he was in the hospital for pneumonia. He was sent home with honey thick liquids. He needs a follow-up outpatient swallow study.  His labs also showed acute elevation in his liver function tests this was thought to be secondary to the illness itself as they did resolve on his recheck 2 weeks ago.  He has underlying COPD he was tried on different inhalers however he did not see any improvement he is still on    Review Of Systems:  GEN- denies fatigue, fever, weight loss,weakness, recent illness HEENT- denies eye drainage, change in vision, nasal discharge, CVS- denies chest pain, palpitations RESP- denies SOB, cough, wheeze ABD- denies N/V, change in stools, abd pain GU- denies dysuria, hematuria, dribbling, incontinence MSK- denies joint pain, muscle aches, injury Neuro- denies headache, dizziness, syncope, seizure activity       Objective:    BP 138/70 mmHg  Pulse 74  Temp(Src) 98.4 F (36.9 C) (Oral)  Resp 18  Ht 5\' 4"  (1.626 m)  Wt 148 lb (67.132 kg)  BMI 25.39 kg/m2  SpO2 97% GEN- NAD, alert and oriented x3, hard of hearing  HEENT- PERRL, EOMI, non injected sclera, pink conjunctiva, MMM, oropharynx clear Neck- Supple, no LAD  CVS- RRR, no murmur, distant HS RESP-Clear, no wheeze, no rales, no rhonchi  ABD-NABS,soft,NT,ND EXT- No edema Pulses- Radial   2+        Assessment & Plan:      Problem List Items Addressed This Visit    Dysphagia    Pt is eating liberal diet, he does still have some dificulty with thin liquid, takes meds with applesauce Will check repeat swallowing study. I think some of his dysphagia was directly related to his acutely ill state      Relevant Orders   SLP modified barium swallow   CAP (community acquired pneumonia) - Primary    Clinically PNA has resolved Will obtain CXR      Relevant Orders   DG Chest 2 View      Note: This dictation was prepared with Dragon dictation along with smaller phrase technology. Any transcriptional errors that result from this process are unintentional.

## 2015-11-23 NOTE — Assessment & Plan Note (Signed)
Clinically PNA has resolved Will obtain CXR

## 2015-11-27 ENCOUNTER — Other Ambulatory Visit: Payer: Self-pay | Admitting: Internal Medicine

## 2015-11-27 ENCOUNTER — Other Ambulatory Visit: Payer: Self-pay | Admitting: Family Medicine

## 2015-11-27 DIAGNOSIS — R1310 Dysphagia, unspecified: Secondary | ICD-10-CM

## 2015-11-28 DIAGNOSIS — I5033 Acute on chronic diastolic (congestive) heart failure: Secondary | ICD-10-CM | POA: Diagnosis not present

## 2015-11-28 DIAGNOSIS — J189 Pneumonia, unspecified organism: Secondary | ICD-10-CM | POA: Diagnosis not present

## 2015-11-28 DIAGNOSIS — A419 Sepsis, unspecified organism: Secondary | ICD-10-CM | POA: Diagnosis not present

## 2015-11-28 DIAGNOSIS — I340 Nonrheumatic mitral (valve) insufficiency: Secondary | ICD-10-CM | POA: Diagnosis not present

## 2015-11-28 DIAGNOSIS — K219 Gastro-esophageal reflux disease without esophagitis: Secondary | ICD-10-CM | POA: Diagnosis not present

## 2015-11-28 DIAGNOSIS — N179 Acute kidney failure, unspecified: Secondary | ICD-10-CM | POA: Diagnosis not present

## 2015-11-28 DIAGNOSIS — G9340 Encephalopathy, unspecified: Secondary | ICD-10-CM | POA: Diagnosis not present

## 2015-11-28 DIAGNOSIS — I110 Hypertensive heart disease with heart failure: Secondary | ICD-10-CM | POA: Diagnosis not present

## 2015-11-28 DIAGNOSIS — R1310 Dysphagia, unspecified: Secondary | ICD-10-CM | POA: Diagnosis not present

## 2015-11-30 ENCOUNTER — Ambulatory Visit
Admission: RE | Admit: 2015-11-30 | Discharge: 2015-11-30 | Disposition: A | Discharge status: Home / Self Care | Payer: Commercial Managed Care - HMO | Source: Ambulatory Visit | Attending: Family Medicine | Admitting: Family Medicine

## 2015-11-30 DIAGNOSIS — R1310 Dysphagia, unspecified: Secondary | ICD-10-CM

## 2015-11-30 DIAGNOSIS — T17308A Unspecified foreign body in larynx causing other injury, initial encounter: Secondary | ICD-10-CM | POA: Diagnosis not present

## 2016-01-26 ENCOUNTER — Other Ambulatory Visit: Payer: Self-pay | Admitting: Family Medicine

## 2016-03-28 ENCOUNTER — Encounter: Payer: Self-pay | Admitting: Family Medicine

## 2016-03-28 ENCOUNTER — Ambulatory Visit (INDEPENDENT_AMBULATORY_CARE_PROVIDER_SITE_OTHER): Payer: Commercial Managed Care - HMO | Admitting: Family Medicine

## 2016-03-28 VITALS — BP 168/74 | HR 78 | Temp 98.2°F | Resp 16 | Ht 64.0 in | Wt 158.0 lb

## 2016-03-28 DIAGNOSIS — I10 Essential (primary) hypertension: Secondary | ICD-10-CM | POA: Diagnosis not present

## 2016-03-28 LAB — CBC WITH DIFFERENTIAL/PLATELET
BASOS PCT: 0 %
Basophils Absolute: 0 cells/uL (ref 0–200)
Eosinophils Absolute: 160 cells/uL (ref 15–500)
Eosinophils Relative: 2 %
HEMATOCRIT: 43.4 % (ref 38.5–50.0)
HEMOGLOBIN: 14.8 g/dL (ref 13.0–17.0)
LYMPHS ABS: 2320 {cells}/uL (ref 850–3900)
Lymphocytes Relative: 29 %
MCH: 31.6 pg (ref 27.0–33.0)
MCHC: 34.1 g/dL (ref 32.0–36.0)
MCV: 92.5 fL (ref 80.0–100.0)
MONO ABS: 800 {cells}/uL (ref 200–950)
MPV: 11 fL (ref 7.5–12.5)
Monocytes Relative: 10 %
Neutro Abs: 4720 cells/uL (ref 1500–7800)
Neutrophils Relative %: 59 %
Platelets: 247 10*3/uL (ref 140–400)
RBC: 4.69 MIL/uL (ref 4.20–5.80)
RDW: 13.3 % (ref 11.0–15.0)
WBC: 8 10*3/uL (ref 3.8–10.8)

## 2016-03-28 MED ORDER — HYDROCHLOROTHIAZIDE 25 MG PO TABS: 25.0000 mg | ORAL_TABLET | Freq: Every day | ORAL | Status: DC

## 2016-03-28 NOTE — Progress Notes (Signed)
Subjective:    Patient ID: Eric Berger, male    DOB: 1925/12/07, 82 y.o.   MRN: [id number]  HPI A she is ear today for follow-up of his hypertension and hyperlipidemia. He is currently on losartan as well as simvastatin. He denies any chest pain shortness of breath or dyspnea on exertion. However his blood pressure today is significantly elevated at 168/74. Otherwise he is doing well with no complaints. He does admit to eating more salt recently. Past Medical History  Diagnosis Date  . Stroke (HCC)   . Renal disorder   . Hernia   . High cholesterol   . Hearing loss   . GERD (gastroesophageal reflux disease)   . Cataracts, bilateral   . Hypertension    Past Surgical History  Procedure Laterality Date  . Cholecystectomy    . Hernia repair    . Eye surgery     Current Outpatient Prescriptions on File Prior to Visit  Medication Sig Dispense Refill  . albuterol-ipratropium (COMBIVENT) 18-103 MCG/ACT inhaler Inhale 2 puffs into the lungs 4 (four) times daily. 1 Inhaler 0  . aspirin 325 MG tablet Take 325 mg by mouth daily.    . cholecalciferol (VITAMIN D) 1000 UNITS tablet Take 1,000 Units by mouth daily.    . Cinnamon 500 MG capsule Take 500 mg by mouth daily.    Marland Kitchen. GARLIC PO Take 1 tablet by mouth daily.    Marland Kitchen. glucosamine-chondroitin 500-400 MG tablet Take 1 tablet by mouth 3 (three) times daily.    . lansoprazole (PREVACID) 15 MG capsule Take 15 mg by mouth daily at 12 noon. Reported on 11/23/2015    . losartan (COZAAR) 50 MG tablet TAKE 1 TABLET (50 MG TOTAL) BY MOUTH DAILY. 30 tablet 5  . Maltodextrin-Xanthan Gum (RESOURCE THICKENUP CLEAR) POWD Take 1 g by mouth as needed. 125 g 0  . Multiple Vitamin (MULTIVITAMIN) tablet Take 1 tablet by mouth daily.    Marland Kitchen. OVER THE COUNTER MEDICATION Reported on 11/23/2015    . saw palmetto 500 MG capsule Take 500 mg by mouth daily.    . simvastatin (ZOCOR) 40 MG tablet TAKE ONE TABLET BY MOUTH ONCE DAILY AT 6PM 90 tablet 0   No current  facility-administered medications on file prior to visit.   No Known Allergies Social History   Social History  . Marital Status: Married    Spouse Name: N/A  . Number of Children: N/A  . Years of Education: N/A   Occupational History  . Not on file.   Social History Main Topics  . Smoking status: Former Smoker    Quit date: 06/08/1962  . Smokeless tobacco: Never Used  . Alcohol Use: No  . Drug Use: No  . Sexual Activity: Yes     Comment: Married.  Retired.   Other Topics Concern  . Not on file   Social History Narrative      Review of Systems  All other systems reviewed and are negative.      Objective:   Physical Exam  Neck: No JVD present.  Cardiovascular: Normal rate, regular rhythm and normal heart sounds.   No murmur heard. Pulmonary/Chest: Effort normal and breath sounds normal. No respiratory distress. He has no wheezes. He has no rales.  Abdominal: Soft. Bowel sounds are normal.  Musculoskeletal: He exhibits no edema.  Vitals reviewed.         Assessment & Plan:  Benign essential HTN - Plan: hydrochlorothiazide (HYDRODIURIL) 25 MG tablet, COMPLETE  METABOLIC PANEL WITH GFR, Lipid panel, CBC with Differential/Platelet  Add hydrochlorothiazide 25 mg by mouth daily to losartan and recheck blood pressure in one month. Check fasting lipid panel. Goal LDL cholesterol is less than 70 given his history of stroke. Monitor his white blood cell count which was elevated in January after community-acquired pneumonia

## 2016-03-29 LAB — COMPLETE METABOLIC PANEL WITH GFR
ALBUMIN: 4.4 g/dL (ref 3.6–5.1)
ALK PHOS: 60 U/L (ref 40–115)
ALT: 20 U/L (ref 9–46)
AST: 21 U/L (ref 10–35)
BILIRUBIN TOTAL: 0.7 mg/dL (ref 0.2–1.2)
BUN: 19 mg/dL (ref 7–25)
CALCIUM: 9.2 mg/dL (ref 8.6–10.3)
CO2: 26 mmol/L (ref 20–31)
Chloride: 104 mmol/L (ref 98–110)
Creat: 1.01 mg/dL (ref 0.70–1.11)
GFR, EST NON AFRICAN AMERICAN: 67 mL/min (ref >=60)
GFR, Est African American: 77 mL/min (ref >=60)
GLUCOSE: 89 mg/dL (ref 70–99)
Potassium: 4.6 mmol/L (ref 3.5–5.3)
SODIUM: 141 mmol/L (ref 135–146)
TOTAL PROTEIN: 6.9 g/dL (ref 6.1–8.1)

## 2016-03-29 LAB — LIPID PANEL
Cholesterol: 144 mg/dL (ref 125–200)
HDL: 47 mg/dL (ref >=40)
LDL Cholesterol: 70 mg/dL (ref <130)
Total CHOL/HDL Ratio: 3.1 Ratio (ref <=5.0)
Triglycerides: 134 mg/dL (ref <150)
VLDL: 27 mg/dL (ref <30)

## 2016-04-04 ENCOUNTER — Emergency Department (HOSPITAL_COMMUNITY): Payer: Commercial Managed Care - HMO

## 2016-04-04 ENCOUNTER — Encounter (HOSPITAL_COMMUNITY): Payer: Self-pay | Admitting: *Deleted

## 2016-04-04 ENCOUNTER — Inpatient Hospital Stay (HOSPITAL_COMMUNITY)
Admission: EM | Admit: 2016-04-04 | Discharge: 2016-04-08 | DRG: 470 | Disposition: A | Discharge status: Skilled Nursing Facility | Payer: Commercial Managed Care - HMO | Attending: Family Medicine | Admitting: Family Medicine

## 2016-04-04 DIAGNOSIS — W010XXA Fall on same level from slipping, tripping and stumbling without subsequent striking against object, initial encounter: Secondary | ICD-10-CM | POA: Diagnosis present

## 2016-04-04 DIAGNOSIS — S7290XA Unspecified fracture of unspecified femur, initial encounter for closed fracture: Secondary | ICD-10-CM | POA: Diagnosis not present

## 2016-04-04 DIAGNOSIS — E785 Hyperlipidemia, unspecified: Secondary | ICD-10-CM | POA: Diagnosis not present

## 2016-04-04 DIAGNOSIS — Z5181 Encounter for therapeutic drug level monitoring: Secondary | ICD-10-CM | POA: Diagnosis not present

## 2016-04-04 DIAGNOSIS — S79911A Unspecified injury of right hip, initial encounter: Secondary | ICD-10-CM | POA: Diagnosis not present

## 2016-04-04 DIAGNOSIS — M6281 Muscle weakness (generalized): Secondary | ICD-10-CM | POA: Diagnosis not present

## 2016-04-04 DIAGNOSIS — R112 Nausea with vomiting, unspecified: Secondary | ICD-10-CM | POA: Diagnosis present

## 2016-04-04 DIAGNOSIS — S72001A Fracture of unspecified part of neck of right femur, initial encounter for closed fracture: Secondary | ICD-10-CM | POA: Diagnosis present

## 2016-04-04 DIAGNOSIS — I5032 Chronic diastolic (congestive) heart failure: Secondary | ICD-10-CM | POA: Diagnosis present

## 2016-04-04 DIAGNOSIS — H9190 Unspecified hearing loss, unspecified ear: Secondary | ICD-10-CM | POA: Diagnosis present

## 2016-04-04 DIAGNOSIS — Z7982 Long term (current) use of aspirin: Secondary | ICD-10-CM | POA: Diagnosis not present

## 2016-04-04 DIAGNOSIS — Z79899 Other long term (current) drug therapy: Secondary | ICD-10-CM | POA: Diagnosis not present

## 2016-04-04 DIAGNOSIS — E7800 Pure hypercholesterolemia, unspecified: Secondary | ICD-10-CM | POA: Diagnosis not present

## 2016-04-04 DIAGNOSIS — T148 Other injury of unspecified body region: Secondary | ICD-10-CM | POA: Diagnosis not present

## 2016-04-04 DIAGNOSIS — K219 Gastro-esophageal reflux disease without esophagitis: Secondary | ICD-10-CM | POA: Diagnosis present

## 2016-04-04 DIAGNOSIS — S72011A Unspecified intracapsular fracture of right femur, initial encounter for closed fracture: Principal | ICD-10-CM | POA: Diagnosis present

## 2016-04-04 DIAGNOSIS — D62 Acute posthemorrhagic anemia: Secondary | ICD-10-CM | POA: Diagnosis not present

## 2016-04-04 DIAGNOSIS — Z87891 Personal history of nicotine dependence: Secondary | ICD-10-CM | POA: Diagnosis not present

## 2016-04-04 DIAGNOSIS — R1313 Dysphagia, pharyngeal phase: Secondary | ICD-10-CM | POA: Diagnosis present

## 2016-04-04 DIAGNOSIS — R1312 Dysphagia, oropharyngeal phase: Secondary | ICD-10-CM | POA: Diagnosis not present

## 2016-04-04 DIAGNOSIS — I639 Cerebral infarction, unspecified: Secondary | ICD-10-CM | POA: Diagnosis not present

## 2016-04-04 DIAGNOSIS — I110 Hypertensive heart disease with heart failure: Secondary | ICD-10-CM | POA: Diagnosis present

## 2016-04-04 DIAGNOSIS — D72829 Elevated white blood cell count, unspecified: Secondary | ICD-10-CM

## 2016-04-04 DIAGNOSIS — I10 Essential (primary) hypertension: Secondary | ICD-10-CM | POA: Diagnosis not present

## 2016-04-04 DIAGNOSIS — Z96649 Presence of unspecified artificial hip joint: Secondary | ICD-10-CM

## 2016-04-04 DIAGNOSIS — Z471 Aftercare following joint replacement surgery: Secondary | ICD-10-CM | POA: Diagnosis not present

## 2016-04-04 DIAGNOSIS — Z96641 Presence of right artificial hip joint: Secondary | ICD-10-CM | POA: Diagnosis not present

## 2016-04-04 DIAGNOSIS — M25551 Pain in right hip: Secondary | ICD-10-CM | POA: Diagnosis present

## 2016-04-04 DIAGNOSIS — I69351 Hemiplegia and hemiparesis following cerebral infarction affecting right dominant side: Secondary | ICD-10-CM | POA: Diagnosis not present

## 2016-04-04 DIAGNOSIS — R918 Other nonspecific abnormal finding of lung field: Secondary | ICD-10-CM | POA: Diagnosis not present

## 2016-04-04 HISTORY — DX: Chronic diastolic (congestive) heart failure: I50.32

## 2016-04-04 LAB — LIPASE, BLOOD: Lipase: 25 U/L (ref 11–51)

## 2016-04-04 LAB — CBC WITH DIFFERENTIAL/PLATELET
BASOS ABS: 0 10*3/uL (ref 0.0–0.1)
Basophils Relative: 0 %
Eosinophils Absolute: 0 10*3/uL (ref 0.0–0.7)
Eosinophils Relative: 0 %
HEMATOCRIT: 41.6 % (ref 39.0–52.0)
HEMOGLOBIN: 14.4 g/dL (ref 13.0–17.0)
LYMPHS PCT: 11 %
Lymphs Abs: 1.5 10*3/uL (ref 0.7–4.0)
MCH: 31.4 pg (ref 26.0–34.0)
MCHC: 34.6 g/dL (ref 30.0–36.0)
MCV: 90.8 fL (ref 78.0–100.0)
MONO ABS: 0.8 10*3/uL (ref 0.1–1.0)
Monocytes Relative: 5 %
NEUTROS ABS: 11.9 10*3/uL — AB (ref 1.7–7.7)
Neutrophils Relative %: 84 %
Platelets: 255 10*3/uL (ref 150–400)
RBC: 4.58 MIL/uL (ref 4.22–5.81)
RDW: 12.6 % (ref 11.5–15.5)
WBC: 14.3 10*3/uL — ABNORMAL HIGH (ref 4.0–10.5)

## 2016-04-04 LAB — TYPE AND SCREEN
ABO/RH(D): O POS
Antibody Screen: NEGATIVE

## 2016-04-04 LAB — BASIC METABOLIC PANEL
ANION GAP: 10 (ref 5–15)
BUN: 38 mg/dL — ABNORMAL HIGH (ref 6–20)
CHLORIDE: 104 mmol/L (ref 101–111)
CO2: 27 mmol/L (ref 22–32)
Calcium: 9.9 mg/dL (ref 8.9–10.3)
Creatinine, Ser: 1.22 mg/dL (ref 0.61–1.24)
GFR calc Af Amer: 60 mL/min — ABNORMAL LOW (ref >60)
GFR calc non Af Amer: 51 mL/min — ABNORMAL LOW (ref >60)
GLUCOSE: 133 mg/dL — AB (ref 65–99)
POTASSIUM: 4.5 mmol/L (ref 3.5–5.1)
Sodium: 141 mmol/L (ref 135–145)

## 2016-04-04 LAB — PROTIME-INR
INR: 1.08 (ref 0.00–1.49)
Prothrombin Time: 14.2 seconds (ref 11.6–15.2)

## 2016-04-04 LAB — APTT: APTT: 32 s (ref 24–37)

## 2016-04-04 LAB — ABO/RH: ABO/RH(D): O POS

## 2016-04-04 MED ORDER — ONDANSETRON HCL 4 MG/2ML IJ SOLN
4.0000 mg | Freq: Once | INTRAMUSCULAR | Status: AC
  Administered 2016-04-04: 4 mg via INTRAVENOUS
  Filled 2016-04-04: qty 2

## 2016-04-04 MED ORDER — SENNOSIDES-DOCUSATE SODIUM 8.6-50 MG PO TABS
1.0000 | ORAL_TABLET | Freq: Every evening | ORAL | Status: DC | PRN
  Filled 2016-04-04: qty 1

## 2016-04-04 MED ORDER — LOSARTAN POTASSIUM 50 MG PO TABS
50.0000 mg | ORAL_TABLET | Freq: Every day | ORAL | Status: DC
  Administered 2016-04-06 – 2016-04-08 (×3): 50 mg via ORAL
  Filled 2016-04-04 (×4): qty 1

## 2016-04-04 MED ORDER — HYDROCHLOROTHIAZIDE 25 MG PO TABS
25.0000 mg | ORAL_TABLET | Freq: Every day | ORAL | Status: DC
  Administered 2016-04-06 – 2016-04-08 (×3): 25 mg via ORAL
  Filled 2016-04-04 (×3): qty 1

## 2016-04-04 MED ORDER — SODIUM CHLORIDE 0.45 % IV SOLN
INTRAVENOUS | Status: DC
  Administered 2016-04-04: 23:00:00 via INTRAVENOUS

## 2016-04-04 MED ORDER — ADULT MULTIVITAMIN W/MINERALS CH
1.0000 | ORAL_TABLET | Freq: Every day | ORAL | Status: DC
  Administered 2016-04-06 – 2016-04-08 (×3): 1 via ORAL
  Filled 2016-04-04 (×3): qty 1

## 2016-04-04 MED ORDER — GLUCOSAMINE-CHONDROITIN 500-400 MG PO TABS: 1.0000 | ORAL_TABLET | Freq: Two times a day (BID) | ORAL | Status: DC

## 2016-04-04 MED ORDER — METHOCARBAMOL 500 MG PO TABS
500.0000 mg | ORAL_TABLET | Freq: Four times a day (QID) | ORAL | Status: DC | PRN
  Administered 2016-04-07: 500 mg via ORAL
  Filled 2016-04-04: qty 1

## 2016-04-04 MED ORDER — ONDANSETRON HCL 4 MG/2ML IJ SOLN: 4.0000 mg | Freq: Three times a day (TID) | INTRAMUSCULAR | Status: DC | PRN

## 2016-04-04 MED ORDER — SODIUM CHLORIDE 0.9 % IV SOLN: INTRAVENOUS | Status: DC

## 2016-04-04 MED ORDER — MAGNESIUM 200 MG PO TABS
1.0000 | ORAL_TABLET | Freq: Every day | ORAL | Status: DC
  Filled 2016-04-04 (×2): qty 1

## 2016-04-04 MED ORDER — HEPARIN SODIUM (PORCINE) 5000 UNIT/ML IJ SOLN
5000.0000 [IU] | Freq: Once | INTRAMUSCULAR | Status: AC
  Administered 2016-04-04: 5000 [IU] via SUBCUTANEOUS
  Filled 2016-04-04: qty 1

## 2016-04-04 MED ORDER — OXYCODONE-ACETAMINOPHEN 5-325 MG PO TABS: 1.0000 | ORAL_TABLET | ORAL | Status: DC | PRN

## 2016-04-04 MED ORDER — VITAMIN D 1000 UNITS PO TABS
1000.0000 [IU] | ORAL_TABLET | Freq: Every day | ORAL | Status: DC
  Administered 2016-04-06 – 2016-04-08 (×3): 1000 [IU] via ORAL
  Filled 2016-04-04 (×3): qty 1

## 2016-04-04 MED ORDER — FENTANYL CITRATE (PF) 100 MCG/2ML IJ SOLN
25.0000 ug | INTRAMUSCULAR | Status: DC | PRN
Start: 2016-04-04 — End: 2016-04-08
  Administered 2016-04-04: 25 ug via INTRAVENOUS
  Filled 2016-04-04: qty 2

## 2016-04-04 MED ORDER — SAW PALMETTO 450 MG PO CAPS: 1.0000 | ORAL_CAPSULE | Freq: Every day | ORAL | Status: DC

## 2016-04-04 MED ORDER — SIMVASTATIN 40 MG PO TABS
40.0000 mg | ORAL_TABLET | Freq: Every day | ORAL | Status: DC
  Administered 2016-04-07: 40 mg via ORAL
  Filled 2016-04-04 (×3): qty 1

## 2016-04-04 MED ORDER — METHOCARBAMOL 1000 MG/10ML IJ SOLN
500.0000 mg | Freq: Four times a day (QID) | INTRAVENOUS | Status: DC | PRN
  Filled 2016-04-04: qty 5

## 2016-04-04 MED ORDER — MORPHINE SULFATE (PF) 2 MG/ML IV SOLN: 2.0000 mg | INTRAVENOUS | Status: DC | PRN

## 2016-04-04 MED ORDER — GARLIC 100 MG PO TABS: 1.0000 | ORAL_TABLET | Freq: Every day | ORAL | Status: DC

## 2016-04-04 MED ORDER — ASPIRIN 325 MG PO TABS
325.0000 mg | ORAL_TABLET | Freq: Every day | ORAL | Status: DC
  Administered 2016-04-06 – 2016-04-08 (×3): 325 mg via ORAL
  Filled 2016-04-04 (×3): qty 1

## 2016-04-04 MED ORDER — HYDRALAZINE HCL 20 MG/ML IJ SOLN: 5.0000 mg | INTRAMUSCULAR | Status: DC | PRN

## 2016-04-04 NOTE — H&P (Signed)
History and Physical    RENWICK ASMAN ZOX:[id number] DOB: [birth year] DOA: 04/04/2016  Referring MD/NP/PA:   PCP: Leo Grosser, MD   Patient coming from:  The patient is coming from home.  At baseline, pt is independent for most of ADL.       Chief Complaint: Right hip pain after fall  HPI: Eric Berger is a 82 y.o. male with medical history significant of hypertension, hyperlipidemia, GERD, stroke, diastolic congestive heart failure, who presents with right hip pain after fall.  Pt states that he fell when he went into the tool shop, was started by a lizard, lost his balance, fell to his rt side and injured his right hip. He developed moderate right hip pain, which is constant, nonradiating. Patient does not have numbness in his right leg. Patient denies head injury or neck injury. No headache or neck pain. Patient has nausea, and vomited when he had x-ray chest. Patient denies chest pain, shortness breath, cough, abdominal pain, diarrhea, symptoms of a UTI or unilateral weakness.  ED Course: pt was found to have WBC 14.3, temperature normal, no tachycardia, no tachypnea, creatinine 1.22. CT right hip showed nondisplaced mildly impacted transverse fracture of the right femoral neck. This fracture is new from prior study 01/02/2016 and likely acute. Pt is admitted to med-surg bed as inpt. Alice Rieger, dr. Eulah Pont was consulted by EDP.  Review of Systems:   General: no fevers, chills, no changes in body weight, has fatigue HEENT: no blurry vision, hearing changes or sore throat Pulm: no dyspnea, coughing, wheezing CV: no chest pain, no palpitations Abd: has nausea, vomiting, no abdominal pain, diarrhea, constipation GU: no dysuria, burning on urination, increased urinary frequency, hematuria  Ext: no leg edema Neuro: no unilateral weakness, numbness, or tingling, no vision change or hearing loss Skin: no rash MSK: has right hip pain. Heme: No easy bruising.  Travel history: No  recent long distant travel.  Allergy: No Known Allergies  Past Medical History  Diagnosis Date  . Stroke (HCC)   . Renal disorder   . Hernia   . High cholesterol   . Hearing loss   . GERD (gastroesophageal reflux disease)   . Cataracts, bilateral   . Hypertension   . Chronic diastolic CHF (congestive heart failure) [facility])     Past Surgical History  Procedure Laterality Date  . Cholecystectomy    . Hernia repair    . Eye surgery      Social History:  reports that he quit smoking about 53 years ago. He has never used smokeless tobacco. He reports that he does not drink alcohol or use illicit drugs.  Family History:  Family History  Problem Relation Age of Onset  . Diabetes Mellitus II Brother      Prior to Admission medications   Medication Sig Start Date End Date Taking? Authorizing Provider  aspirin 325 MG tablet Take 325 mg by mouth daily.   Yes Historical Provider, MD  Cholecalciferol (VITAMIN D-3) 1000 units CAPS Take 1 capsule by mouth daily.   Yes Historical Provider, MD  GARLIC PO Take 1 tablet by mouth daily.   Yes Historical Provider, MD  glucosamine-chondroitin 500-400 MG tablet Take 1 tablet by mouth 2 (two) times daily.    Yes Historical Provider, MD  hydrochlorothiazide (HYDRODIURIL) 25 MG tablet Take 1 tablet (25 mg total) by mouth daily. 03/28/16  Yes Donita Brooks, MD  losartan (COZAAR) 50 MG tablet TAKE 1 TABLET (50 MG TOTAL) BY MOUTH DAILY.  10/29/15  Yes Donita Brooks, MD  Magnesium 250 MG TABS Take 1 tablet by mouth daily.   Yes Historical Provider, MD  Multiple Vitamin (MULTIVITAMIN) tablet Take 1 tablet by mouth daily.   Yes Historical Provider, MD  Saw Palmetto 450 MG CAPS Take 1 capsule by mouth daily.   Yes Historical Provider, MD  simvastatin (ZOCOR) 40 MG tablet TAKE ONE TABLET BY MOUTH ONCE DAILY AT [facility] 05/11/15  Yes Donita Brooks, MD  albuterol-ipratropium (COMBIVENT) 18-103 MCG/ACT inhaler Inhale 2 puffs into the lungs 4 (four) times  daily. Patient not taking: Reported on 04/04/2016 09/29/15   Rolly Salter, MD  Maltodextrin-Xanthan Gum (RESOURCE THICKENUP CLEAR) POWD Take 1 g by mouth as needed. Patient not taking: Reported on 04/04/2016 10/03/15   Calvert Cantor, MD    Physical Exam: Filed Vitals:   04/04/16 1614 04/04/16 1925 04/04/16 1930  BP: 137/60 119/54 124/57  Pulse: 81 83 78  Temp: 97.5 F (36.4 C)    Resp: 18    Height: 5\' 5"  (1.651 m)    Weight: 68.493 kg (151 lb)    SpO2: 94% 93% 93%   General: Not in acute distress HEENT:       Eyes: PERRL, EOMI, no scleral icterus.       ENT: No discharge from the ears and nose, no pharynx injection, no tonsillar enlargement.        Neck: No JVD, no bruit, no mass felt. Heme: No neck lymph node enlargement. Cardiac: S1/S2, RRR, No murmurs, No gallops or rubs. Pulm:. No rales, wheezing, rhonchi or rubs. Abd: Soft, nondistended, nontender, no rebound pain, no organomegaly, BS present. GU: No hematuria Ext: No pitting leg edema bilaterally. 2+DP/PT pulse bilaterally. Musculoskeletal: has tenderness over right hip. Skin: No rashes.  Neuro: Alert, oriented X3, cranial nerves II-XII grossly intact, moves all extremities normally.  Psych: Patient is not psychotic, no suicidal or hemocidal ideation.  Labs on Admission: I have personally reviewed following labs and imaging studies  CBC:  Recent Labs Lab 04/04/16 1717  WBC 14.3*  NEUTROABS 11.9*  HGB 14.4  HCT 41.6  MCV 90.8  PLT 255   Basic Metabolic Panel:  Recent Labs Lab 04/04/16 1717  NA 141  K 4.5  CL 104  CO2 27  GLUCOSE 133*  BUN 38*  CREATININE 1.22  CALCIUM 9.9   GFR: Estimated Creatinine Clearance: 37.1 mL/min (by C-G formula based on Cr of 1.22). Liver Function Tests: No results for input(s): AST, ALT, ALKPHOS, BILITOT, PROT, ALBUMIN in the last 168 hours. No results for input(s): LIPASE, AMYLASE in the last 168 hours. No results for input(s): AMMONIA in the last 168  hours. Coagulation Profile: No results for input(s): INR, PROTIME in the last 168 hours. Cardiac Enzymes: No results for input(s): CKTOTAL, CKMB, CKMBINDEX, TROPONINI in the last 168 hours. BNP (last 3 results) No results for input(s): PROBNP in the last 8760 hours. HbA1C: No results for input(s): HGBA1C in the last 72 hours. CBG: No results for input(s): GLUCAP in the last 168 hours. Lipid Profile: No results for input(s): CHOL, HDL, LDLCALC, TRIG, CHOLHDL, LDLDIRECT in the last 72 hours. Thyroid Function Tests: No results for input(s): TSH, T4TOTAL, FREET4, T3FREE, THYROIDAB in the last 72 hours. Anemia Panel: No results for input(s): VITAMINB12, FOLATE, FERRITIN, TIBC, IRON, RETICCTPCT in the last 72 hours. Urine analysis:    Component Value Date/Time   COLORURINE YELLOW 09/26/2015 0522   APPEARANCEUR CLEAR 09/26/2015 0522   LABSPEC 1.024 09/26/2015  0522   PHURINE 5.5 09/26/2015 0522   GLUCOSEU NEGATIVE 09/26/2015 0522   HGBUR MODERATE* 09/26/2015 0522   BILIRUBINUR NEGATIVE 09/26/2015 0522   KETONESUR NEGATIVE 09/26/2015 0522   PROTEINUR 30* 09/26/2015 0522   UROBILINOGEN 0.2 06/08/2012 0901   NITRITE NEGATIVE 09/26/2015 0522   LEUKOCYTESUR SMALL* 09/26/2015 0522   Sepsis Labs: @LABRCNTIP (procalcitonin:4,lacticidven:4) )No results found for this or any previous visit (from the past 240 hour(s)).   Radiological Exams on Admission: Ct Hip Right Wo Contrast  04/04/2016  CLINICAL DATA:  82 year old male with fall and right hip pain. EXAM: CT OF THE RIGHT HIP WITHOUT CONTRAST TECHNIQUE: Multidetector CT imaging of the right hip was performed according to the standard protocol. Multiplanar CT image reconstructions were also generated. COMPARISON:  First radiograph dated 04/04/2016 and CT of the abdomen pelvis dated 09/24/2015 FINDINGS: There is advanced osteopenia which limits evaluation for fracture. There is a transverse fracture of the right femoral neck. There is  foreshortening of the femoral neck with mild impaction. There is sharp edge of the fracture a along the medial cortex of the left femoral neck. This fracture is new compared the CT dated 09/24/2015 and likely represents an acute fracture. There is no dislocation. There is no joint effusion. The soft tissues appear unremarkable. IMPRESSION: Nondisplaced mildly impacted transverse fracture of the right femoral neck. This fracture is new from prior study 01/02/2016 and likely acute. No dislocation. Electronically Signed   By: Elgie Collard M.D.   On: 04/04/2016 18:27   Dg Hip Unilat With Pelvis 2-3 Views Right  04/04/2016  CLINICAL DATA:  Right hip pain after fall. EXAM: DG HIP (WITH OR WITHOUT PELVIS) 2-3V RIGHT COMPARISON:  None. FINDINGS: The right femoral neck appears foreshortened and has a different configuration when compared with scout radiograph from CT dated 09/24/2015. Findings are suspicious for an impacted subcapital femoral neck fracture. Left hip appears located and intact. IMPRESSION: 1. Cannot rule out subcapital femoral neck fracture involving the right hip. Further investigation with either MRI or CT is recommended. These results will be called to the ordering clinician or representative by the Radiologist Assistant, and communication documented in the PACS or zVision Dashboard. Electronically Signed   By: Signa Kell M.D.   On: 04/04/2016 17:17     EKG: Not done in ED, will get one  Assessment/Plan Principal Problem:   Closed right hip fracture (HCC) Active Problems:   G E R D   HTN (hypertension)   Stroke (HCC)   High cholesterol   Chronic diastolic CHF (congestive heart failure) (HCC)   Nausea & vomiting   Closed right hip fracture (HCC): As evidenced by x-ray. Patient has moderate pain now. No neurovascular compromise. Orthopedic surgeon,Dr. Eulah Pont was consulted.   - will admit to Med-surg bed as inpt - Pain control: morphine prn and percocet - When necessary Zofran  for nausea - Robaxin for muscle spasm - type and cross - INR/PTT - DVT PPx: give two dose of sq heparin tonight plus SCD  Leukocytosis: Likely due to stress-induced demargination. Patient does not have signs of infection. -Follow-up CBC  Nausea & vomiting: likely due to pain. No AP or diarrhea. -check lipase -prn Zofran for nausea  HTN: -continue home HCTZ, Cozaar -IV hydralazine when necessary  HLD: Last LDL was 70 on 03/28/16 -Continue home medications: Zocor  Hx of stroke: no acute new issues. -continue ASA and zocor  Chronic diastolic CHF (congestive heart failure) (HCC): 2-D echo 09/29/15 showed EF 55%. No leg  edema or JVD. CHF is compensated. -Continue HCTZ and aspirin -check BNP  DVT ppx: SCD  Code Status: Full code (patient was DO NOT RESUSCITATE before, but wants to be on full code today) Family Communication: None at bed side.  Disposition Plan:  Anticipate discharge back to previous home environment Consults called:  Ortho, Dr. Eulah PontMurphy Admission status: medical floor/inpt  Date of Service 04/04/2016    Lorretta HarpIU, Ronnette Rump [facility] Pager [phone]  If 7PM-7AM, please contact night-coverage [email or web address] Password [email or web address] 04/04/2016, 9:05 PM

## 2016-04-04 NOTE — ED Notes (Signed)
The pt arrived by gems from home  He was outside working for a little while went into the tool shop and was started by a lizard and lost his balance and fell to his rt side.  Hi sonly complaint is rt hip pain  Alert oriented skin warm and dry no distress

## 2016-04-04 NOTE — Progress Notes (Signed)
PHARMACIST - PHYSICIAN ORDER COMMUNICATION  CONCERNING: P&T Medication Policy on Herbal Medications  DESCRIPTION:  This patient's order for:  Garlic, glucosamine-chondroitin, saw palmetto  has been noted.  These product(s) are classified as an "herbal" or natural product. Due to a lack of definitive safety studies or FDA approval, nonstandard manufacturing practices, plus the potential risk of unknown drug-drug interactions while on inpatient medications, the Pharmacy and Therapeutics Committee does not permit the use of "herbal" or natural products of this type within [facility].   ACTION TAKEN: The pharmacy department is unable to verify this order at this time and your patient has been informed of this safety policy. Please reevaluate patient's clinical condition at discharge and address if the herbal or natural product(s) should be resumed at that time.  Loura BackJennifer Middleport, PharmD, BCPS Clinical Pharmacist Pager: [phone] 04/04/2016 8:23 PM

## 2016-04-04 NOTE — ED Notes (Signed)
Attempted to call report

## 2016-04-04 NOTE — ED Provider Notes (Signed)
CSN: [id number]     Arrival date & time 04/04/16  1612 History   First MD Initiated Contact with Patient 04/04/16 1617     Chief Complaint  Patient presents with  . Fall     (Consider location/radiation/quality/duration/timing/severity/associated sxs/prior Treatment) Patient is a 82 y.o. male presenting with fall. The history is provided by the patient.  Fall This is a new problem. The current episode started less than 1 hour ago. The problem occurs constantly. The problem has not changed since onset.Pertinent negatives include no chest pain and no abdominal pain. Associated symptoms comments: Unable to walk or lift himself from the ground. Exacerbated by: Movement of the right leg. Nothing relieves the symptoms. He has tried nothing for the symptoms.    Past Medical History  Diagnosis Date  . Stroke (HCC)   . Renal disorder   . Hernia   . High cholesterol   . Hearing loss   . GERD (gastroesophageal reflux disease)   . Cataracts, bilateral   . Hypertension    Past Surgical History  Procedure Laterality Date  . Cholecystectomy    . Hernia repair    . Eye surgery     Family History  Problem Relation Age of Onset  . Diabetes Mellitus II Brother    Social History  Substance Use Topics  . Smoking status: Former Smoker    Quit date: 06/08/1962  . Smokeless tobacco: Never Used  . Alcohol Use: No    Review of Systems  Cardiovascular: Negative for chest pain.  Gastrointestinal: Negative for abdominal pain.  All other systems reviewed and are negative.     Allergies  Review of patient's allergies indicates no known allergies.  Home Medications   Prior to Admission medications   Medication Sig Start Date End Date Taking? Authorizing Provider  aspirin 325 MG tablet Take 325 mg by mouth daily.   Yes Historical Provider, MD  Cholecalciferol (VITAMIN D-3) 1000 units CAPS Take 1 capsule by mouth daily.   Yes Historical Provider, MD  GARLIC PO Take 1 tablet by mouth daily.    Yes Historical Provider, MD  glucosamine-chondroitin 500-400 MG tablet Take 1 tablet by mouth 2 (two) times daily.    Yes Historical Provider, MD  hydrochlorothiazide (HYDRODIURIL) 25 MG tablet Take 1 tablet (25 mg total) by mouth daily. 03/28/16  Yes Donita BrooksWarren T Pickard, MD  losartan (COZAAR) 50 MG tablet TAKE 1 TABLET (50 MG TOTAL) BY MOUTH DAILY. 10/29/15  Yes Donita BrooksWarren T Pickard, MD  Magnesium 250 MG TABS Take 1 tablet by mouth daily.   Yes Historical Provider, MD  Multiple Vitamin (MULTIVITAMIN) tablet Take 1 tablet by mouth daily.   Yes Historical Provider, MD  Saw Palmetto 450 MG CAPS Take 1 capsule by mouth daily.   Yes Historical Provider, MD  simvastatin (ZOCOR) 40 MG tablet TAKE ONE TABLET BY MOUTH ONCE DAILY AT [address] [facility] [employer]6PM 05/11/15  Yes Donita BrooksWarren T Pickard, MD  albuterol-ipratropium (COMBIVENT) 18-103 MCG/ACT inhaler Inhale 2 puffs into the lungs 4 (four) times daily. Patient not taking: Reported on 04/04/2016 09/29/15   Rolly SalterPranav M Patel, MD  Maltodextrin-Xanthan Gum (RESOURCE THICKENUP CLEAR) POWD Take 1 g by mouth as needed. Patient not taking: Reported on 04/04/2016 10/03/15   Calvert CantorSaima Rizwan, MD   BP 137/60 mmHg  Pulse 81  Temp(Src) 97.5 F (36.4 C)  Resp 18  Ht 5\' 5"  (1.651 m)  Wt 151 lb (68.493 kg)  BMI 25.13 kg/m2  SpO2 94% Physical Exam  Constitutional: He is oriented to  person, place, and time. He appears well-developed and well-nourished. No distress.  HENT:  Head: Normocephalic and atraumatic.  Eyes: Conjunctivae are normal.  Neck: Neck supple. No tracheal deviation present.  Cardiovascular: Normal rate, regular rhythm and normal heart sounds.   Pulmonary/Chest: Effort normal and breath sounds normal. No respiratory distress.  Abdominal: Soft. He exhibits no distension. There is no tenderness.  Musculoskeletal:       Right hip: He exhibits tenderness (posteriorly).  Neurological: He is alert and oriented to person, place, and time.  Skin: Skin is warm and dry.  Psychiatric: He has a  normal mood and affect.  Vitals reviewed.   ED Course  Procedures (including critical care time) Labs Review Labs Reviewed  CBC WITH DIFFERENTIAL/PLATELET - Abnormal; Notable for the following:    WBC 14.3 (*)    Neutro Abs 11.9 (*)    All other components within normal limits  BASIC METABOLIC PANEL - Abnormal; Notable for the following:    Glucose, Bld 133 (*)    BUN 38 (*)    GFR calc non Af Amer 51 (*)    GFR calc Af Amer 60 (*)    All other components within normal limits    Imaging Review Ct Hip Right Wo Contrast  04/04/2016  CLINICAL DATA:  82 year old male with fall and right hip pain. EXAM: CT OF THE RIGHT HIP WITHOUT CONTRAST TECHNIQUE: Multidetector CT imaging of the right hip was performed according to the standard protocol. Multiplanar CT image reconstructions were also generated. COMPARISON:  First radiograph dated 04/04/2016 and CT of the abdomen pelvis dated 09/24/2015 FINDINGS: There is advanced osteopenia which limits evaluation for fracture. There is a transverse fracture of the right femoral neck. There is foreshortening of the femoral neck with mild impaction. There is sharp edge of the fracture a along the medial cortex of the left femoral neck. This fracture is new compared the CT dated 09/24/2015 and likely represents an acute fracture. There is no dislocation. There is no joint effusion. The soft tissues appear unremarkable. IMPRESSION: Nondisplaced mildly impacted transverse fracture of the right femoral neck. This fracture is new from prior study 01/02/2016 and likely acute. No dislocation. Electronically Signed   By: Elgie Collard M.D.   On: 04/04/2016 18:27   Dg Hip Unilat With Pelvis 2-3 Views Right  04/04/2016  CLINICAL DATA:  Right hip pain after fall. EXAM: DG HIP (WITH OR WITHOUT PELVIS) 2-3V RIGHT COMPARISON:  None. FINDINGS: The right femoral neck appears foreshortened and has a different configuration when compared with scout radiograph from CT dated  09/24/2015. Findings are suspicious for an impacted subcapital femoral neck fracture. Left hip appears located and intact. IMPRESSION: 1. Cannot rule out subcapital femoral neck fracture involving the right hip. Further investigation with either MRI or CT is recommended. These results will be called to the ordering clinician or representative by the Radiologist Assistant, and communication documented in the PACS or zVision Dashboard. Electronically Signed   By: Signa Kell M.D.   On: 04/04/2016 17:17   I have personally reviewed and evaluated these images and lab results as part of my medical decision-making.   EKG Interpretation None      MDM   Final diagnoses:  Femoral neck fracture, right, closed, initial encounter    82 year old male presents after being startled by lizard outside of his garage and falling onto his right hip sustaining an acute fracture of the femoral neck. No other signs of injury. Neurovascularly intact distal to  injury. Orthopedics consulted, admitted to hospitalist.    Lyndal Pulley, MD 04/05/16 0025

## 2016-04-05 ENCOUNTER — Observation Stay (HOSPITAL_COMMUNITY): Payer: Commercial Managed Care - HMO | Admitting: Certified Registered Nurse Anesthetist

## 2016-04-05 ENCOUNTER — Encounter (HOSPITAL_COMMUNITY)
Admission: EM | Disposition: A | Discharge status: Skilled Nursing Facility | Payer: Self-pay | Source: Home / Self Care | Attending: Family Medicine

## 2016-04-05 ENCOUNTER — Observation Stay (HOSPITAL_COMMUNITY): Payer: Commercial Managed Care - HMO

## 2016-04-05 ENCOUNTER — Encounter (HOSPITAL_COMMUNITY): Payer: Self-pay | Admitting: Certified Registered Nurse Anesthetist

## 2016-04-05 DIAGNOSIS — K219 Gastro-esophageal reflux disease without esophagitis: Secondary | ICD-10-CM | POA: Diagnosis present

## 2016-04-05 DIAGNOSIS — S72001A Fracture of unspecified part of neck of right femur, initial encounter for closed fracture: Secondary | ICD-10-CM

## 2016-04-05 DIAGNOSIS — I5032 Chronic diastolic (congestive) heart failure: Secondary | ICD-10-CM | POA: Diagnosis present

## 2016-04-05 DIAGNOSIS — R112 Nausea with vomiting, unspecified: Secondary | ICD-10-CM | POA: Diagnosis present

## 2016-04-05 DIAGNOSIS — R1313 Dysphagia, pharyngeal phase: Secondary | ICD-10-CM | POA: Diagnosis present

## 2016-04-05 DIAGNOSIS — Z87891 Personal history of nicotine dependence: Secondary | ICD-10-CM | POA: Diagnosis not present

## 2016-04-05 DIAGNOSIS — I69351 Hemiplegia and hemiparesis following cerebral infarction affecting right dominant side: Secondary | ICD-10-CM | POA: Diagnosis not present

## 2016-04-05 DIAGNOSIS — E785 Hyperlipidemia, unspecified: Secondary | ICD-10-CM | POA: Diagnosis present

## 2016-04-05 DIAGNOSIS — I10 Essential (primary) hypertension: Secondary | ICD-10-CM | POA: Diagnosis not present

## 2016-04-05 DIAGNOSIS — Z79899 Other long term (current) drug therapy: Secondary | ICD-10-CM | POA: Diagnosis not present

## 2016-04-05 DIAGNOSIS — Z7982 Long term (current) use of aspirin: Secondary | ICD-10-CM | POA: Diagnosis not present

## 2016-04-05 DIAGNOSIS — M25551 Pain in right hip: Secondary | ICD-10-CM | POA: Diagnosis present

## 2016-04-05 DIAGNOSIS — Z96641 Presence of right artificial hip joint: Secondary | ICD-10-CM | POA: Diagnosis not present

## 2016-04-05 DIAGNOSIS — I110 Hypertensive heart disease with heart failure: Secondary | ICD-10-CM | POA: Diagnosis present

## 2016-04-05 DIAGNOSIS — H9190 Unspecified hearing loss, unspecified ear: Secondary | ICD-10-CM | POA: Diagnosis present

## 2016-04-05 DIAGNOSIS — D62 Acute posthemorrhagic anemia: Secondary | ICD-10-CM | POA: Diagnosis not present

## 2016-04-05 DIAGNOSIS — S72011A Unspecified intracapsular fracture of right femur, initial encounter for closed fracture: Secondary | ICD-10-CM | POA: Diagnosis present

## 2016-04-05 DIAGNOSIS — E7800 Pure hypercholesterolemia, unspecified: Secondary | ICD-10-CM | POA: Diagnosis not present

## 2016-04-05 DIAGNOSIS — Z471 Aftercare following joint replacement surgery: Secondary | ICD-10-CM | POA: Diagnosis not present

## 2016-04-05 DIAGNOSIS — W010XXA Fall on same level from slipping, tripping and stumbling without subsequent striking against object, initial encounter: Secondary | ICD-10-CM | POA: Diagnosis present

## 2016-04-05 HISTORY — PX: HIP ARTHROPLASTY: SHX981

## 2016-04-05 LAB — CBC
HEMATOCRIT: 35.1 % — AB (ref 39.0–52.0)
Hemoglobin: 12 g/dL — ABNORMAL LOW (ref 13.0–17.0)
MCH: 31.3 pg (ref 26.0–34.0)
MCHC: 34.2 g/dL (ref 30.0–36.0)
MCV: 91.6 fL (ref 78.0–100.0)
PLATELETS: 202 10*3/uL (ref 150–400)
RBC: 3.83 MIL/uL — ABNORMAL LOW (ref 4.22–5.81)
RDW: 12.6 % (ref 11.5–15.5)
WBC: 14.8 10*3/uL — ABNORMAL HIGH (ref 4.0–10.5)

## 2016-04-05 LAB — CREATININE, SERUM
Creatinine, Ser: 1.13 mg/dL (ref 0.61–1.24)
GFR calc Af Amer: >60 mL/min (ref >60)
GFR calc non Af Amer: 56 mL/min — ABNORMAL LOW (ref >60)

## 2016-04-05 LAB — MRSA PCR SCREENING: MRSA by PCR: NEGATIVE

## 2016-04-05 LAB — BRAIN NATRIURETIC PEPTIDE: B NATRIURETIC PEPTIDE 5: 171.4 pg/mL — AB (ref 0.0–100.0)

## 2016-04-05 SURGERY — HEMIARTHROPLASTY, HIP, DIRECT ANTERIOR APPROACH, FOR FRACTURE
Anesthesia: Spinal | Site: Hip | Laterality: Right

## 2016-04-05 MED ORDER — EPHEDRINE SULFATE-NACL 50-0.9 MG/10ML-% IV SOSY
PREFILLED_SYRINGE | INTRAVENOUS | Status: DC | PRN
  Administered 2016-04-05 (×2): 10 mg via INTRAVENOUS

## 2016-04-05 MED ORDER — EPHEDRINE 5 MG/ML INJ
INTRAVENOUS | Status: AC
  Filled 2016-04-05: qty 10

## 2016-04-05 MED ORDER — METOCLOPRAMIDE HCL 5 MG PO TABS: 5.0000 mg | ORAL_TABLET | Freq: Three times a day (TID) | ORAL | Status: DC | PRN

## 2016-04-05 MED ORDER — DEXTROSE-NACL 5-0.45 % IV SOLN: 100.0000 mL/h | INTRAVENOUS | Status: DC

## 2016-04-05 MED ORDER — BUPIVACAINE HCL (PF) 0.5 % IJ SOLN
INTRAMUSCULAR | Status: AC
  Filled 2016-04-05: qty 30

## 2016-04-05 MED ORDER — CHLORHEXIDINE GLUCONATE 4 % EX LIQD: 60.0000 mL | Freq: Once | CUTANEOUS | Status: DC

## 2016-04-05 MED ORDER — ONDANSETRON HCL 4 MG/2ML IJ SOLN
INTRAMUSCULAR | Status: AC
  Filled 2016-04-05: qty 2

## 2016-04-05 MED ORDER — ROCURONIUM BROMIDE 50 MG/5ML IV SOLN
INTRAVENOUS | Status: AC
Start: 2016-04-05 — End: 2016-04-05
  Filled 2016-04-05: qty 1

## 2016-04-05 MED ORDER — PROPOFOL 10 MG/ML IV BOLUS
INTRAVENOUS | Status: DC | PRN
  Administered 2016-04-05: 10 mg via INTRAVENOUS

## 2016-04-05 MED ORDER — ACETAMINOPHEN 500 MG PO TABS: 1000.0000 mg | ORAL_TABLET | Freq: Once | ORAL | Status: DC

## 2016-04-05 MED ORDER — DOCUSATE SODIUM 100 MG PO CAPS
100.0000 mg | ORAL_CAPSULE | Freq: Two times a day (BID) | ORAL | Status: DC
  Administered 2016-04-05 – 2016-04-08 (×5): 100 mg via ORAL
  Filled 2016-04-05 (×5): qty 1

## 2016-04-05 MED ORDER — MENTHOL 3 MG MT LOZG: 1.0000 | LOZENGE | OROMUCOSAL | Status: DC | PRN

## 2016-04-05 MED ORDER — HYDROCODONE-ACETAMINOPHEN 5-325 MG PO TABS: 1.0000 | ORAL_TABLET | ORAL | Status: DC | PRN

## 2016-04-05 MED ORDER — PROPOFOL 10 MG/ML IV BOLUS
INTRAVENOUS | Status: AC
  Filled 2016-04-05: qty 20

## 2016-04-05 MED ORDER — METOCLOPRAMIDE HCL 5 MG/ML IJ SOLN: 5.0000 mg | Freq: Three times a day (TID) | INTRAMUSCULAR | Status: DC | PRN

## 2016-04-05 MED ORDER — LACTATED RINGERS IV SOLN
INTRAVENOUS | Status: DC | PRN
  Administered 2016-04-05: 07:00:00 via INTRAVENOUS

## 2016-04-05 MED ORDER — MIDAZOLAM HCL 2 MG/2ML IJ SOLN
INTRAMUSCULAR | Status: AC
  Filled 2016-04-05: qty 2

## 2016-04-05 MED ORDER — PHENOL 1.4 % MT LIQD: 1.0000 | OROMUCOSAL | Status: DC | PRN

## 2016-04-05 MED ORDER — CEFAZOLIN SODIUM-DEXTROSE 2-4 GM/100ML-% IV SOLN
2.0000 g | Freq: Four times a day (QID) | INTRAVENOUS | Status: AC
  Administered 2016-04-05: 2 g via INTRAVENOUS
  Filled 2016-04-05 (×3): qty 100

## 2016-04-05 MED ORDER — POVIDONE-IODINE 10 % EX SWAB: 2.0000 "application " | Freq: Once | CUTANEOUS | Status: DC

## 2016-04-05 MED ORDER — PHENYLEPHRINE 40 MCG/ML (10ML) SYRINGE FOR IV PUSH (FOR BLOOD PRESSURE SUPPORT)
PREFILLED_SYRINGE | INTRAVENOUS | Status: DC | PRN
  Administered 2016-04-05 (×3): 80 ug via INTRAVENOUS

## 2016-04-05 MED ORDER — ACETAMINOPHEN 500 MG PO TABS
ORAL_TABLET | ORAL | Status: AC
  Filled 2016-04-05: qty 2

## 2016-04-05 MED ORDER — MEPERIDINE HCL 25 MG/ML IJ SOLN: 6.2500 mg | INTRAMUSCULAR | Status: DC | PRN

## 2016-04-05 MED ORDER — ONDANSETRON HCL 4 MG/2ML IJ SOLN: 4.0000 mg | Freq: Once | INTRAMUSCULAR | Status: DC | PRN

## 2016-04-05 MED ORDER — MORPHINE SULFATE (PF) 2 MG/ML IV SOLN
1.0000 mg | INTRAVENOUS | Status: DC | PRN
Start: 2016-04-05 — End: 2016-04-08

## 2016-04-05 MED ORDER — BUPIVACAINE HCL (PF) 0.5 % IJ SOLN
INTRAMUSCULAR | Status: DC | PRN
  Administered 2016-04-05: 3 mL via INTRATHECAL

## 2016-04-05 MED ORDER — MIDAZOLAM HCL 5 MG/5ML IJ SOLN
INTRAMUSCULAR | Status: DC | PRN
  Administered 2016-04-05: 1 mg via INTRAVENOUS

## 2016-04-05 MED ORDER — DEXTROSE 5 % IV SOLN
10.0000 mg | INTRAVENOUS | Status: DC | PRN
  Administered 2016-04-05: 40 ug/min via INTRAVENOUS

## 2016-04-05 MED ORDER — CEFAZOLIN SODIUM-DEXTROSE 2-4 GM/100ML-% IV SOLN
2.0000 g | INTRAVENOUS | Status: AC
  Administered 2016-04-05: 2 g via INTRAVENOUS

## 2016-04-05 MED ORDER — ALBUMIN HUMAN 5 % IV SOLN
INTRAVENOUS | Status: DC | PRN
  Administered 2016-04-05: 09:00:00 via INTRAVENOUS

## 2016-04-05 MED ORDER — SUCCINYLCHOLINE CHLORIDE 200 MG/10ML IV SOSY
PREFILLED_SYRINGE | INTRAVENOUS | Status: AC
  Filled 2016-04-05: qty 10

## 2016-04-05 MED ORDER — DEXAMETHASONE SODIUM PHOSPHATE 10 MG/ML IJ SOLN
10.0000 mg | Freq: Once | INTRAMUSCULAR | Status: AC
  Administered 2016-04-06: 10 mg via INTRAVENOUS
  Filled 2016-04-05: qty 1

## 2016-04-05 MED ORDER — KETOROLAC TROMETHAMINE 15 MG/ML IJ SOLN
7.5000 mg | Freq: Four times a day (QID) | INTRAMUSCULAR | Status: AC
  Administered 2016-04-06 (×2): 7.5 mg via INTRAVENOUS
  Filled 2016-04-05 (×2): qty 1

## 2016-04-05 MED ORDER — CEFAZOLIN SODIUM-DEXTROSE 2-4 GM/100ML-% IV SOLN
INTRAVENOUS | Status: AC
  Filled 2016-04-05: qty 100

## 2016-04-05 MED ORDER — 0.9 % SODIUM CHLORIDE (POUR BTL) OPTIME
TOPICAL | Status: DC | PRN
  Administered 2016-04-05: 1000 mL

## 2016-04-05 MED ORDER — ENOXAPARIN SODIUM 40 MG/0.4ML ~~LOC~~ SOLN
40.0000 mg | SUBCUTANEOUS | Status: DC
  Administered 2016-04-06 – 2016-04-08 (×3): 40 mg via SUBCUTANEOUS
  Filled 2016-04-05 (×3): qty 0.4

## 2016-04-05 MED ORDER — HYDROMORPHONE HCL 1 MG/ML IJ SOLN: 0.2500 mg | INTRAMUSCULAR | Status: DC | PRN

## 2016-04-05 MED ORDER — FENTANYL CITRATE (PF) 100 MCG/2ML IJ SOLN
INTRAMUSCULAR | Status: DC | PRN
  Administered 2016-04-05: 50 ug via INTRAVENOUS

## 2016-04-05 MED ORDER — FENTANYL CITRATE (PF) 250 MCG/5ML IJ SOLN
INTRAMUSCULAR | Status: AC
  Filled 2016-04-05: qty 5

## 2016-04-05 MED ORDER — DEXTROSE-NACL 5-0.45 % IV SOLN: INTRAVENOUS | Status: DC

## 2016-04-05 SURGICAL SUPPLY — 52 items
BIT DRILL 7/64X5 DISP (BIT) ×3 IMPLANT
BLADE SAGITTAL 25.0X1.27X90 (BLADE) ×2 IMPLANT
BLADE SAGITTAL 25.0X1.27X90MM (BLADE) ×1
CAPT HIP HEMI 2 ×3 IMPLANT
CLOSURE STERI-STRIP 1/2X4 (GAUZE/BANDAGES/DRESSINGS) ×1
CLSR STERI-STRIP ANTIMIC 1/2X4 (GAUZE/BANDAGES/DRESSINGS) ×2 IMPLANT
COVER SURGICAL LIGHT HANDLE (MISCELLANEOUS) ×3 IMPLANT
DRAPE ORTHO SPLIT 77X108 STRL (DRAPES) ×6
DRAPE SURG ORHT 6 SPLT 77X108 (DRAPES) ×2 IMPLANT
DRAPE U-SHAPE 47X51 STRL (DRAPES) ×3 IMPLANT
DRSG MEPILEX BORDER 4X8 (GAUZE/BANDAGES/DRESSINGS) ×3 IMPLANT
DURAPREP 26ML APPLICATOR (WOUND CARE) ×3 IMPLANT
ELECT BLADE 4.0 EZ CLEAN MEGAD (MISCELLANEOUS) ×3
ELECT CAUTERY BLADE 6.4 (BLADE) ×3 IMPLANT
ELECT REM PT RETURN 9FT ADLT (ELECTROSURGICAL) ×3
ELECTRODE BLDE 4.0 EZ CLN MEGD (MISCELLANEOUS) ×1 IMPLANT
ELECTRODE REM PT RTRN 9FT ADLT (ELECTROSURGICAL) ×1 IMPLANT
FACESHIELD WRAPAROUND (MASK) ×6 IMPLANT
GLOVE BIO SURGEON STRL SZ 6.5 (GLOVE) ×2 IMPLANT
GLOVE BIO SURGEON STRL SZ7.5 (GLOVE) ×6 IMPLANT
GLOVE BIO SURGEONS STRL SZ 6.5 (GLOVE) ×1
GLOVE BIOGEL PI IND STRL 6.5 (GLOVE) ×1 IMPLANT
GLOVE BIOGEL PI IND STRL 7.0 (GLOVE) ×1 IMPLANT
GLOVE BIOGEL PI IND STRL 8 (GLOVE) ×2 IMPLANT
GLOVE BIOGEL PI INDICATOR 6.5 (GLOVE) ×2
GLOVE BIOGEL PI INDICATOR 7.0 (GLOVE) ×2
GLOVE BIOGEL PI INDICATOR 8 (GLOVE) ×4
GLOVE SKINSENSE NS SZ7.0 (GLOVE) ×2
GLOVE SKINSENSE STRL SZ7.0 (GLOVE) ×1 IMPLANT
GOWN STRL REUS W/ TWL LRG LVL3 (GOWN DISPOSABLE) ×2 IMPLANT
GOWN STRL REUS W/ TWL XL LVL3 (GOWN DISPOSABLE) ×2 IMPLANT
GOWN STRL REUS W/TWL LRG LVL3 (GOWN DISPOSABLE) ×6
GOWN STRL REUS W/TWL XL LVL3 (GOWN DISPOSABLE) ×6
HIP CAPITATED HEMI 2 ×1 IMPLANT
KIT BASIN OR (CUSTOM PROCEDURE TRAY) ×3 IMPLANT
KIT ROOM TURNOVER OR (KITS) ×3 IMPLANT
MANIFOLD NEPTUNE II (INSTRUMENTS) ×3 IMPLANT
NS IRRIG 1000ML POUR BTL (IV SOLUTION) ×3 IMPLANT
PACK TOTAL JOINT (CUSTOM PROCEDURE TRAY) ×3 IMPLANT
PAD ARMBOARD 7.5X6 YLW CONV (MISCELLANEOUS) ×6 IMPLANT
PILLOW ABDUCTION HIP (SOFTGOODS) ×3 IMPLANT
RETRIEVER SUT HEWSON (MISCELLANEOUS) ×3 IMPLANT
SUT FIBERWIRE #2 38 REV NDL BL (SUTURE) ×6
SUT MNCRL AB 4-0 PS2 18 (SUTURE) ×3 IMPLANT
SUT MON AB 2-0 CT1 36 (SUTURE) ×3 IMPLANT
SUT VIC AB 1 CT1 27 (SUTURE) ×6
SUT VIC AB 1 CT1 27XBRD ANBCTR (SUTURE) ×2 IMPLANT
SUTURE FIBERWR#2 38 REV NDL BL (SUTURE) ×2 IMPLANT
TOWEL OR 17X24 6PK STRL BLUE (TOWEL DISPOSABLE) ×3 IMPLANT
TOWEL OR 17X26 10 PK STRL BLUE (TOWEL DISPOSABLE) ×3 IMPLANT
TRAY CATH 16FR W/PLASTIC CATH (SET/KITS/TRAYS/PACK) ×3 IMPLANT
TRAY FOLEY CATH 14FR (SET/KITS/TRAYS/PACK) IMPLANT

## 2016-04-05 NOTE — Progress Notes (Signed)
phenylephrine gtt off

## 2016-04-05 NOTE — Anesthesia Postprocedure Evaluation (Signed)
Anesthesia Post Note  Patient: Eric Berger  Procedure(s) Performed: Procedure(s) (LRB): RIGHT HIP HEMIARTHROPLASTY (Right)  Patient location during evaluation: PACU Anesthesia Type: Spinal Level of consciousness: oriented and awake and alert Pain management: pain level controlled Vital Signs Assessment: post-procedure vital signs reviewed and stable Respiratory status: spontaneous breathing, respiratory function stable and patient connected to nasal cannula oxygen Cardiovascular status: blood pressure returned to baseline and stable Postop Assessment: no headache and no backache Anesthetic complications: no    Last Vitals:  Filed Vitals:   04/05/16 1110 04/05/16 1156  BP: 98/44 100/44  Pulse: 81 84  Temp: 36.7 C 37.1 C  Resp: 13 16    Last Pain:  Filed Vitals:   04/05/16 1218  PainSc: 0-No pain                 Jannat Rosemeyer DAVID

## 2016-04-05 NOTE — Op Note (Signed)
04/04/2016 - 04/05/2016  9:04 AM  PATIENT:  Eric Berger   MRN: [id number]  PRE-OPERATIVE DIAGNOSIS:  right hip fracture  POST-OPERATIVE DIAGNOSIS:  right hip fracture  PROCEDURE:  Procedure(s): RIGHT HIP HEMIARTHROPLASTY  PREOPERATIVE INDICATIONS:  ARVIND Berger is an 82 y.o. male who was admitted 04/04/2016 with Eric diagnosis of Closed right hip fracture ([place]) and elected for surgical management.  The risks benefits and alternatives were discussed with the patient including but not limited to the risks of nonoperative treatment, versus surgical intervention including infection, bleeding, nerve injury, periprosthetic fracture, the need for revision surgery, dislocation, leg length discrepancy, blood clots, cardiopulmonary complications, morbidity, mortality, among others, and they were willing to proceed.  Predicted outcome is good, although there will be at least Eric six to nine month expected recovery.   OPERATIVE REPORT     SURGEON:  Edmonia Lynch, MD    ASSISTANT:  Roxan Hockey, PA-C, he was present and scrubbed throughout the case, critical for completion in Eric timely fashion, and for retraction, instrumentation, and closure.     ANESTHESIA:  General    COMPLICATIONS:  None.      COMPONENTS:  [employer] A[employer]: Femoral stem: 4.5, Femoral Head:48, Neck:-4   PROCEDURE IN DETAIL: The patient was met in the holding area and identified.  The appropriate hip  was marked at the operative site. The patient was then transported to the OR and  placed under general anesthesia.  At that point, the patient was  placed in the lateral decubitus position with the operative side up and  secured to the operating room table and all bony prominences padded.     The operative lower extremity was prepped from the iliac crest to the toes.  Sterile draping was performed.  Time out was performed prior to incision.      Eric routine posterolateral approach was utilized via sharp dissection  carried  down to the subcutaneous tissue.  Gross bleeders were Bovie  coagulated.  The iliotibial band was identified and incised  along the length of the skin incision.  Self-retaining retractors were  inserted. I examined the bursa there was significant hematoma and edema I performed Eric bursectomy here.  With the hip internally rotated, the short external rotators  were identified. The piriformis was tagged with FiberWire, and the hip capsule released in Eric T-type fashion.  The femoral neck was exposed, and I resected the femoral neck using the appropriate jig. This was performed at approximately Eric thumb's breadth above the lesser trochanter.    I then exposed the deep acetabulum, cleared out any tissue including the ligamentum teres, and included the hip capsule in the FiberWire used above and below the T.    I then prepared the proximal femur using the cookie-cutter, the lateralizing reamer, and then sequentially broached.  Eric trial utilized, and I reduced the hip and it was found to have excellent stability with functional range of motion. The trial components were then removed.   The canal and acetabulum were thoroughly irrigated  I inserted the pressfit stem and placed the head and neck collar. The hip was reduced with appropriate force and was stable through Eric range of motion.   I then used Eric 2 mm drill bits to pass the FiberWire suture from the capsule and puriform is through the greater trochanter, and secured this. Excellent posterior capsular repair was achieved. I also closed the T in the capsule.  I then irrigated the hip copiously again with pulse  lavage, and repaired the fascia with Vicryl, followed by Vicryl for the subcutaneous tissue, Monocryl for the skin, Steri-Strips and sterile gauze. The wounds were injected. The patient was then awakened and returned to PACU in stable and satisfactory condition. There were no complications.  POST-OP PLAN: Weight bearing as tolerated. DVT px will  consist of SCD's and chemical px  Edmonia Lynch, MD Orthopedic Surgeon [phone]   04/05/2016 9:04 AM   This note was generated using Eric template and dragon dictation system. In light of that, I have reviewed the note and all aspects of it are applicable to this case. Any dictation errors are due to the computerized dictation system.

## 2016-04-05 NOTE — Anesthesia Preprocedure Evaluation (Addendum)
Anesthesia Evaluation  Patient identified by MRN, date of birth, ID band Patient awake    Reviewed: Allergy & Precautions, NPO status , Patient's Chart, lab work & pertinent test results  Airway Mallampati: II  TM Distance: >3 FB Neck ROM: Full    Dental  (+) Edentulous Upper, Edentulous Lower   Pulmonary former smoker,    Pulmonary exam normal        Cardiovascular hypertension, Pt. on medications +CHF  Normal cardiovascular exam  ECHO 1/17 Study Conclusions  - Left ventricle: The cavity size was normal. Systolic function was  normal. The estimated ejection fraction was in the range of 55%  to 60%. Wall motion was normal; there were no regional wall  motion abnormalities. - Aortic valve: Trileaflet; normal thickness, mildly calcified  leaflets. - Mitral valve: There was moderate regurgitation. - Left atrium: The atrium was moderately dilated. - Right atrium: The atrium was mildly dilated. - Tricuspid valve: There was mild-moderate regurgitation. - Pulmonary arteries: Systolic pressure was moderately increased.  PA peak pressure: 56 mm Hg (S).    Neuro/Psych CVA, Residual Symptoms    GI/Hepatic GERD  Controlled,  Endo/Other    Renal/GU      Musculoskeletal   Abdominal   Peds  Hematology   Anesthesia Other Findings Weakness on right side from CVA  Reproductive/Obstetrics                            Anesthesia Physical Anesthesia Plan  ASA: III  Anesthesia Plan: Spinal   Post-op Pain Management:    Induction: Intravenous  Airway Management Planned: Simple Face Mask  Additional Equipment:   Intra-op Plan:   Post-operative Plan:   Informed Consent: I have reviewed the patients History and Physical, chart, labs and discussed the procedure including the risks, benefits and alternatives for the proposed anesthesia with the patient or authorized representative who has  indicated his/her understanding and acceptance.   Dental advisory given  Plan Discussed with: CRNA and Surgeon  Anesthesia Plan Comments:        Anesthesia Quick Evaluation

## 2016-04-05 NOTE — H&P (View-Only) (Signed)
   ORTHOPAEDIC CONSULTATION  REQUESTING PHYSICIAN: Gagan S Lama, MD  Chief Complaint: Right hip pain  Assessment: Principal Problem:   Closed right hip fracture (HCC) Active Problems:   G E R D   HTN (hypertension)   Stroke (HCC)   High cholesterol   Chronic diastolic CHF (congestive heart failure) (HCC)   Nausea & vomiting  Plan: Right Hip Hemiarthroplasty -Medicine team admit and perform pre-op clearance -NPO  -PT/OT post op -will ammend WB status postop, bedrest for now -Likely to require Rehab or SNF placement upon discharge.  -VTE prophylaxis: SCDs, lovenox last night   HPI: Westley H Brandon is a 82 y.o. male with a medical history significant of hypertension, hyperlipidemia, GERD, stroke, diastolic congestive heart failure who complains of right hip pain after a fall yesterday.  Family reports falls in the recent past after less frequent ambulation following treatment for PNA in January.  He has a history of stroke in 2010.  He is a former smoker.  Denies pain in any other location.  No CP, SOB.  CT shows Nondisplaced mildly impacted transverse fracture of the right femoral neck.  Orthopedics was consulted for evaluation.  Past Medical History  Diagnosis Date  . Stroke (HCC)   . Renal disorder   . Hernia   . High cholesterol   . Hearing loss   . GERD (gastroesophageal reflux disease)   . Cataracts, bilateral   . Hypertension   . Chronic diastolic CHF (congestive heart failure) (HCC)    Past Surgical History  Procedure Laterality Date  . Cholecystectomy    . Hernia repair    . Eye surgery     Social History   Social History  . Marital Status: Married    Spouse Name: N/A  . Number of Children: N/A  . Years of Education: N/A   Social History Main Topics  . Smoking status: Former Smoker    Quit date: 06/08/1962  . Smokeless tobacco: Never Used  . Alcohol Use: No  . Drug Use: No  . Sexual Activity: Yes     Comment: Married.  Retired.   Other  Topics Concern  . None   Social History Narrative   Family History  Problem Relation Age of Onset  . Diabetes Mellitus II Brother    No Known Allergies Prior to Admission medications   Medication Sig Start Date End Date Taking? Authorizing Provider  aspirin 325 MG tablet Take 325 mg by mouth daily.   Yes Historical Provider, MD  Cholecalciferol (VITAMIN D-3) 1000 units CAPS Take 1 capsule by mouth daily.   Yes Historical Provider, MD  GARLIC PO Take 1 tablet by mouth daily.   Yes Historical Provider, MD  glucosamine-chondroitin 500-400 MG tablet Take 1 tablet by mouth 2 (two) times daily.    Yes Historical Provider, MD  hydrochlorothiazide (HYDRODIURIL) 25 MG tablet Take 1 tablet (25 mg total) by mouth daily. 03/28/16  Yes Warren T Pickard, MD  losartan (COZAAR) 50 MG tablet TAKE 1 TABLET (50 MG TOTAL) BY MOUTH DAILY. 10/29/15  Yes Warren T Pickard, MD  Magnesium 250 MG TABS Take 1 tablet by mouth daily.   Yes Historical Provider, MD  Multiple Vitamin (MULTIVITAMIN) tablet Take 1 tablet by mouth daily.   Yes Historical Provider, MD  Saw Palmetto 450 MG CAPS Take 1 capsule by mouth daily.   Yes Historical Provider, MD  simvastatin (ZOCOR) 40 MG tablet TAKE ONE TABLET BY MOUTH ONCE DAILY AT 6PM 05/11/15  Yes   Warren T Pickard, MD  albuterol-ipratropium (COMBIVENT) 18-103 MCG/ACT inhaler Inhale 2 puffs into the lungs 4 (four) times daily. Patient not taking: Reported on 04/04/2016 09/29/15   Pranav M Patel, MD  Maltodextrin-Xanthan Gum (RESOURCE THICKENUP CLEAR) POWD Take 1 g by mouth as needed. Patient not taking: Reported on 04/04/2016 10/03/15   Saima Rizwan, MD   Ct Hip Right Wo Contrast  04/04/2016  CLINICAL DATA:  89-year-old male with fall and right hip pain. EXAM: CT OF THE RIGHT HIP WITHOUT CONTRAST TECHNIQUE: Multidetector CT imaging of the right hip was performed according to the standard protocol. Multiplanar CT image reconstructions were also generated. COMPARISON:  First radiograph dated  04/04/2016 and CT of the abdomen pelvis dated 09/24/2015 FINDINGS: There is advanced osteopenia which limits evaluation for fracture. There is a transverse fracture of the right femoral neck. There is foreshortening of the femoral neck with mild impaction. There is sharp edge of the fracture a along the medial cortex of the left femoral neck. This fracture is new compared the CT dated 09/24/2015 and likely represents an acute fracture. There is no dislocation. There is no joint effusion. The soft tissues appear unremarkable. IMPRESSION: Nondisplaced mildly impacted transverse fracture of the right femoral neck. This fracture is new from prior study 01/02/2016 and likely acute. No dislocation. Electronically Signed   By: Arash  Radparvar M.D.   On: 04/04/2016 18:27   Dg Hip Unilat With Pelvis 2-3 Views Right  04/04/2016  CLINICAL DATA:  Right hip pain after fall. EXAM: DG HIP (WITH OR WITHOUT PELVIS) 2-3V RIGHT COMPARISON:  None. FINDINGS: The right femoral neck appears foreshortened and has a different configuration when compared with scout radiograph from CT dated 09/24/2015. Findings are suspicious for an impacted subcapital femoral neck fracture. Left hip appears located and intact. IMPRESSION: 1. Cannot rule out subcapital femoral neck fracture involving the right hip. Further investigation with either MRI or CT is recommended. These results will be called to the ordering clinician or representative by the Radiologist Assistant, and communication documented in the PACS or zVision Dashboard. Electronically Signed   By: Taylor  Stroud M.D.   On: 04/04/2016 17:17    Positive ROS: All other systems have been reviewed and were otherwise negative with the exception of those mentioned in the HPI and as above.  Objective: Labs cbc  Recent Labs  04/04/16 1717  WBC 14.3*  HGB 14.4  HCT 41.6  PLT 255   Labs coag  Recent Labs  04/04/16 2039  INR 1.08     Recent Labs  04/04/16 1717  NA 141  K  4.5  CL 104  CO2 27  GLUCOSE 133*  BUN 38*  CREATININE 1.22  CALCIUM 9.9    Physical Exam: Filed Vitals:   04/05/16 0011 04/05/16 0402  BP: 119/43 121/45  Pulse: 92 89  Temp: 99.3 F (37.4 C) 99.5 F (37.5 C)  Resp: 24 24   General: Alert, no acute distress.  Wife and daughter at bedside. Neurologic: No gross focal findings or movement disorder appreciated. Respiratory: No cyanosis, no use of accessory musculature Cardiovascular: No pedal edema GI: Abdomen is soft and non-tender, non-distended. Skin: Warm and dry.  No lesions in the area of chief complaint  Extremities: Warm and well perfused w/o edema Psychiatric: Patient is competent for consent with normal mood and affect  MUSCULOSKELETAL:  Hip mildly tender laterally.  Pain w/ movement   NVI.  Sensation intact distally. Other extremities are atraumatic with painless ROM and NVI.     Dajour Pierpoint Calvin Martensen III PA-C 04/05/2016 7:02 AM   

## 2016-04-05 NOTE — Progress Notes (Signed)
PT Cancellation Note  Patient Details Name: Magnus IvanWilliam H Keown MRN: [id number] DOB: 1926-03-08   Cancelled Treatment:    Reason Eval/Treat Not Completed: Patient at procedure or test/unavailable, in the OR   Fabio AsaWerner, Doy Taaffe J 04/05/2016, 7:21 AM Charlotte Crumbevon Brandalyn Harting, PT DPT  [phone]

## 2016-04-05 NOTE — Anesthesia Procedure Notes (Signed)
Spinal Patient location during procedure: OR Start time: 04/05/2016 7:40 AM End time: 04/05/2016 7:45 AM Staffing Anesthesiologist: Arta BruceSSEY, KEVIN Performed by: anesthesiologist  Preanesthetic Checklist Completed: patient identified, site marked, surgical consent, pre-op evaluation, timeout performed, IV checked, risks and benefits discussed and monitors and equipment checked Spinal Block Patient position: sitting Prep: Betadine Patient monitoring: heart rate, cardiac monitor, continuous pulse ox and blood pressure Approach: right paramedian Location: L3-4 Injection technique: single-shot Needle Needle type: Pencan  Needle gauge: 24 G Needle length: 9 cm Needle insertion depth: 7 cm

## 2016-04-05 NOTE — Progress Notes (Signed)
Dentures given to family at bedside

## 2016-04-05 NOTE — Progress Notes (Signed)
Late entry from 7:15 today. PO acetaminophen ordered by Dr. Eulah PontMurphy. Patient reports that he has some swallowing difficulty. At the request of anesthesia did not administer PO acetaminophen. Dr. Eulah PontMurphy aware.

## 2016-04-05 NOTE — Transfer of Care (Signed)
Immediate Anesthesia Transfer of Care Note  Patient: Eric Berger  Procedure(s) Performed: Procedure(s): RIGHT HIP HEMIARTHROPLASTY (Right)  Patient Location: PACU  Anesthesia Type:Spinal  Level of Consciousness: awake, alert , oriented and patient cooperative  Airway & Oxygen Therapy: Patient Spontanous Breathing and Patient connected to nasal cannula oxygen  Post-op Assessment: Report given to RN and Post -op Vital signs reviewed and stable  Post vital signs: Reviewed and stable  Last Vitals:  Filed Vitals:   04/05/16 0011 04/05/16 0402  BP: 119/43 121/45  Pulse: 92 89  Temp: 37.4 C 37.5 C  Resp: 24 24    Last Pain:  Filed Vitals:   04/05/16 0404  PainSc: 0-No pain         Complications: No apparent anesthesia complications

## 2016-04-05 NOTE — Interval H&P Note (Signed)
History and Physical Interval Note:  04/05/2016 9:02 AM  Eric Berger  has presented today for surgery, with the diagnosis of right hip fracture  The various methods of treatment have been discussed with the patient and family. After consideration of risks, benefits and other options for treatment, the patient has consented to  Procedure(s): RIGHT HIP HEMIARTHROPLASTY (Right) as a surgical intervention .  The patient's history has been reviewed, patient examined, no change in status, stable for surgery.  I have reviewed the patient's chart and labs.  Questions were answered to the patient's satisfaction.     [name]

## 2016-04-05 NOTE — Progress Notes (Signed)
[facility]  PROGRESS NOTE  Eric Berger ZOX:[id number] DOB: Jul 01, 1926 DOA: 04/04/2016 PCP: Leo GrosserPICKARD,WARREN TOM, MD    Brief HPI:  82 y.o. male with medical history significant of hypertension, hyperlipidemia, GERD, stroke, diastolic congestive heart failure, who presents with right hip pain after fall.  Pt states that he fell when he went into the tool shop, was started by a lizard, lost his balance, fell to his rt side and injured his right hip. He developed moderate right hip pain, which is constant, nonradiating. Patient does not have numbness in his right leg. Patient denies head injury or neck injury. No headache or neck pain. Patient has nausea, and vomited when he had x-ray chest. Patient denies chest pain, shortness breath, cough, abdominal pain, diarrhea, symptoms of a UTI or unilateral weakness. Principal Problem:   Closed right hip fracture (HCC) Active Problems:   G E R D   HTN (hypertension)   Stroke (HCC)   High cholesterol   Chronic diastolic CHF (congestive heart failure) (HCC)   Nausea & vomiting   Assessment/Plan: Closed right hip fracture (HCC): As evidenced by x-ray. S/p right hip hemiarthroplasty. Orthopedics following.  Leukocytosis: Likely due to stress-induced demargination. Patient does not have signs of infection.   Nausea & vomiting:  resolved, lipase is 25. -prn Zofran for nausea  HTN: -continue home HCTZ, Cozaar -IV hydralazine when necessary  HLD: Last LDL was 70 on 03/28/16 -Continue home medications: Zocor  Hx of stroke: no acute new issues. -continue ASA and zocor  Chronic diastolic CHF (congestive heart failure) (HCC): 2-D echo 09/29/15 showed EF 55%. No leg edema or JVD.  CHF is compensated. -Continue HCTZ and aspirin   DVT prophylaxis: Lovenox Code Status: Full code Family Communication: no family at bedside Disposition Plan: SNF   Consultants:  Orthopedics   Procedures:  Right hip  hemiarthroplasty  Antibiotics:  None   Subjective: Patient seen and examined, s/p right hip hemiarthroplasty. Denies pain at this time.  Objective: Filed Vitals:   04/05/16 1039 04/05/16 1054 04/05/16 1110 04/05/16 1156  BP: 94/45 100/41 98/44 100/44  Pulse: 77 77 81 84  Temp:   98 F (36.7 C) 98.7 F (37.1 C)  TempSrc:    Oral  Resp: 22 23 13 16   Height:      Weight:      SpO2: 99% 95% 94% 100%    Intake/Output Summary (Last 24 hours) at 04/05/16 1554 Last data filed at 04/05/16 1200  Gross per 24 hour  Intake   1750 ml  Output    800 ml  Net    950 ml   Filed Weights   04/04/16 1614 04/04/16 1932  Weight: 68.493 kg (151 lb) 69.219 kg (152 lb 9.6 oz)    Examination:  General exam: Appears calm and comfortable  Respiratory system: Clear to auscultation. Respiratory effort normal. Cardiovascular system: S1 & S2 heard, RRR. No JVD, murmurs, rubs, gallops or clicks. No pedal edema. Gastrointestinal system: Abdomen is nondistended, soft and nontender. No organomegaly or masses felt. Normal bowel sounds heard. Central nervous system: Alert and oriented. No focal neurological deficits. Extremities: Symmetric 5 x 5 power. Skin: No rashes, lesions or ulcers Psychiatry: Judgement and insight appear normal. Mood & affect appropriate.    Data Reviewed: I have personally reviewed following labs and imaging studies Basic Metabolic Panel:  Recent Labs Lab 04/04/16 1717 04/05/16 1218  NA 141  --   K 4.5  --   CL 104  --  CO2 27  --   GLUCOSE 133*  --   BUN 38*  --   CREATININE 1.22 1.13  CALCIUM 9.9  --    Liver Function Tests: No results for input(s): AST, ALT, ALKPHOS, BILITOT, PROT, ALBUMIN in the last 168 hours.  Recent Labs Lab 04/04/16 2039  LIPASE 25   No results for input(s): AMMONIA in the last 168 hours. CBC:  Recent Labs Lab 04/04/16 1717 04/05/16 1218  WBC 14.3* 14.8*  NEUTROABS 11.9*  --   HGB 14.4 12.0*  HCT 41.6 35.1*  MCV 90.8 91.6   PLT 255 202   Cardiac Enzymes: No results for input(s): CKTOTAL, CKMB, CKMBINDEX, TROPONINI in the last 168 hours. BNP (last 3 results)  Recent Labs  09/30/15 1619 04/05/16 0612  BNP 803.4* 171.4*    ProBNP (last 3 results) No results for input(s): PROBNP in the last 8760 hours.  CBG: No results for input(s): GLUCAP in the last 168 hours.  Recent Results (from the past 240 hour(s))  MRSA PCR Screening     Status: None   Collection Time: 04/05/16  4:16 AM  Result Value Ref Range Status   MRSA by PCR NEGATIVE NEGATIVE Final    Comment:        The GeneXpert MRSA Assay (FDA approved for NASAL specimens only), is one component of a comprehensive MRSA colonization surveillance program. It is not intended to diagnose MRSA infection nor to guide or monitor treatment for MRSA infections.      Studies: Dg Pelvis Portable  04/05/2016  CLINICAL DATA:  Status post right hip replacement. EXAM: PORTABLE PELVIS 1-2 VIEWS COMPARISON:  CT scan 04/04/2016. FINDINGS: Patient is status post right hip hemiarthroplasty. No evidence for hardware complications. Gas in the overlying soft tissues compatible with the immediate postoperative state. IMPRESSION: Status post right hip hemiarthroplasty without complicating features. Electronically Signed   By: Kennith Center M.D.   On: 04/05/2016 15:07   Ct Hip Right Wo Contrast  04/04/2016  CLINICAL DATA:  82 year old male with fall and right hip pain. EXAM: CT OF THE RIGHT HIP WITHOUT CONTRAST TECHNIQUE: Multidetector CT imaging of the right hip was performed according to the standard protocol. Multiplanar CT image reconstructions were also generated. COMPARISON:  First radiograph dated 04/04/2016 and CT of the abdomen pelvis dated 09/24/2015 FINDINGS: There is advanced osteopenia which limits evaluation for fracture. There is a transverse fracture of the right femoral neck. There is foreshortening of the femoral neck with mild impaction. There is sharp  edge of the fracture a along the medial cortex of the left femoral neck. This fracture is new compared the CT dated 09/24/2015 and likely represents an acute fracture. There is no dislocation. There is no joint effusion. The soft tissues appear unremarkable. IMPRESSION: Nondisplaced mildly impacted transverse fracture of the right femoral neck. This fracture is new from prior study 01/02/2016 and likely acute. No dislocation. Electronically Signed   By: Elgie Collard M.D.   On: 04/04/2016 18:27   Dg Hip Unilat With Pelvis 2-3 Views Right  04/04/2016  CLINICAL DATA:  Right hip pain after fall. EXAM: DG HIP (WITH OR WITHOUT PELVIS) 2-3V RIGHT COMPARISON:  None. FINDINGS: The right femoral neck appears foreshortened and has a different configuration when compared with scout radiograph from CT dated 09/24/2015. Findings are suspicious for an impacted subcapital femoral neck fracture. Left hip appears located and intact. IMPRESSION: 1. Cannot rule out subcapital femoral neck fracture involving the right hip. Further investigation with either MRI  or CT is recommended. These results will be called to the ordering clinician or representative by the Radiologist Assistant, and communication documented in the PACS or zVision Dashboard. Electronically Signed   By: Signa Kell M.D.   On: 04/04/2016 17:17    Scheduled Meds: . acetaminophen      . aspirin  325 mg Oral Daily  . ceFAZolin      .  ceFAZolin (ANCEF) IV  2 g Intravenous Q6H  . cholecalciferol  1,000 Units Oral Daily  . [START ON 04/06/2016] dexamethasone  10 mg Intravenous Once  . docusate sodium  100 mg Oral BID  . [START ON 04/06/2016] enoxaparin (LOVENOX) injection  40 mg Subcutaneous Q24H  . hydrochlorothiazide  25 mg Oral Daily  . ketorolac  7.5 mg Intravenous Q6H  . losartan  50 mg Oral Daily  . Magnesium  1 tablet Oral Daily  . multivitamin with minerals  1 tablet Oral Daily  . simvastatin  40 mg Oral q1800   Continuous Infusions: .  dextrose 5 % and 0.45% NaCl         Time spent: 25 min    [facility]  [facility] Pager [phone]. If 7PM-7AM, please contact night-coverage at [email or web address], Office  [phone]  password [email or web address] 04/05/2016, 3:54 PM  LOS: 1 day

## 2016-04-05 NOTE — Consult Note (Signed)
ORTHOPAEDIC CONSULTATION  REQUESTING PHYSICIAN: Meredeth IdeGagan S Lama, MD  Chief Complaint: Right hip pain  Assessment: Principal Problem:   Closed right hip fracture (HCC) Active Problems:   G E R D   HTN (hypertension)   Stroke (HCC)   High cholesterol   Chronic diastolic CHF (congestive heart failure) (HCC)   Nausea & vomiting  Plan: Right Hip Hemiarthroplasty -Medicine team admit and perform pre-op clearance -NPO  -PT/OT post op -will ammend WB status postop, bedrest for now -Likely to require Rehab or SNF placement upon discharge.  -VTE prophylaxis: SCDs, lovenox last night   HPI: Eric Berger is a 82 y.o. male with a medical history significant of hypertension, hyperlipidemia, GERD, stroke, diastolic congestive heart failure who complains of right hip pain after a fall yesterday.  Family reports falls in the recent past after less frequent ambulation following treatment for PNA in January.  He has a history of stroke in 2010.  He is a former smoker.  Denies pain in any other location.  No CP, SOB.  CT shows Nondisplaced mildly impacted transverse fracture of the right femoral neck.  Orthopedics was consulted for evaluation.  Past Medical History  Diagnosis Date  . Stroke (HCC)   . Renal disorder   . Hernia   . High cholesterol   . Hearing loss   . GERD (gastroesophageal reflux disease)   . Cataracts, bilateral   . Hypertension   . Chronic diastolic CHF (congestive heart failure) [facility](HCC)    Past Surgical History  Procedure Laterality Date  . Cholecystectomy    . Hernia repair    . Eye surgery     Social History   Social History  . Marital Status: Married    Spouse Name: N/A  . Number of Children: N/A  . Years of Education: N/A   Social History Main Topics  . Smoking status: Former Smoker    Quit date: 06/08/1962  . Smokeless tobacco: Never Used  . Alcohol Use: No  . Drug Use: No  . Sexual Activity: Yes     Comment: Married.  Retired.   Other  Topics Concern  . None   Social History Narrative   Family History  Problem Relation Age of Onset  . Diabetes Mellitus II Brother    No Known Allergies Prior to Admission medications   Medication Sig Start Date End Date Taking? Authorizing Provider  aspirin 325 MG tablet Take 325 mg by mouth daily.   Yes Historical Provider, MD  Cholecalciferol (VITAMIN D-3) 1000 units CAPS Take 1 capsule by mouth daily.   Yes Historical Provider, MD  GARLIC PO Take 1 tablet by mouth daily.   Yes Historical Provider, MD  glucosamine-chondroitin 500-400 MG tablet Take 1 tablet by mouth 2 (two) times daily.    Yes Historical Provider, MD  hydrochlorothiazide (HYDRODIURIL) 25 MG tablet Take 1 tablet (25 mg total) by mouth daily. 03/28/16  Yes Donita BrooksWarren T Pickard, MD  losartan (COZAAR) 50 MG tablet TAKE 1 TABLET (50 MG TOTAL) BY MOUTH DAILY. 10/29/15  Yes Donita BrooksWarren T Pickard, MD  Magnesium 250 MG TABS Take 1 tablet by mouth daily.   Yes Historical Provider, MD  Multiple Vitamin (MULTIVITAMIN) tablet Take 1 tablet by mouth daily.   Yes Historical Provider, MD  Saw Palmetto 450 MG CAPS Take 1 capsule by mouth daily.   Yes Historical Provider, MD  simvastatin (ZOCOR) 40 MG tablet TAKE ONE TABLET BY MOUTH ONCE DAILY AT [facility]6PM 05/11/15  Yes  Donita Brooks, MD  albuterol-ipratropium [facility]) [phone] MCG/ACT inhaler Inhale 2 puffs into the lungs 4 (four) times daily. Patient not taking: Reported on 04/04/2016 09/29/15   Rolly Salter, MD  Maltodextrin-Xanthan Gum (RESOURCE THICKENUP CLEAR) POWD Take 1 g by mouth as needed. Patient not taking: Reported on 04/04/2016 10/03/15   Calvert Cantor, MD   Ct Hip Right Wo Contrast  04/04/2016  CLINICAL DATA:  82 year old male with fall and right hip pain. EXAM: CT OF THE RIGHT HIP WITHOUT CONTRAST TECHNIQUE: Multidetector CT imaging of the right hip was performed according to the standard protocol. Multiplanar CT image reconstructions were also generated. COMPARISON:  First radiograph dated  04/04/2016 and CT of the abdomen pelvis dated 09/24/2015 FINDINGS: There is advanced osteopenia which limits evaluation for fracture. There is a transverse fracture of the right femoral neck. There is foreshortening of the femoral neck with mild impaction. There is sharp edge of the fracture a along the medial cortex of the left femoral neck. This fracture is new compared the CT dated 09/24/2015 and likely represents an acute fracture. There is no dislocation. There is no joint effusion. The soft tissues appear unremarkable. IMPRESSION: Nondisplaced mildly impacted transverse fracture of the right femoral neck. This fracture is new from prior study 01/02/2016 and likely acute. No dislocation. Electronically Signed   By: Elgie Collard M.D.   On: 04/04/2016 18:27   Dg Hip Unilat With Pelvis 2-3 Views Right  04/04/2016  CLINICAL DATA:  Right hip pain after fall. EXAM: DG HIP (WITH OR WITHOUT PELVIS) 2-3V RIGHT COMPARISON:  None. FINDINGS: The right femoral neck appears foreshortened and has a different configuration when compared with scout radiograph from CT dated 09/24/2015. Findings are suspicious for an impacted subcapital femoral neck fracture. Left hip appears located and intact. IMPRESSION: 1. Cannot rule out subcapital femoral neck fracture involving the right hip. Further investigation with either MRI or CT is recommended. These results will be called to the ordering clinician or representative by the Radiologist Assistant, and communication documented in the PACS or zVision Dashboard. Electronically Signed   By: Signa Kell M.D.   On: 04/04/2016 17:17    Positive ROS: All other systems have been reviewed and were otherwise negative with the exception of those mentioned in the HPI and as above.  Objective: Labs cbc  Recent Labs  04/04/16 1717  WBC 14.3*  HGB 14.4  HCT 41.6  PLT 255   Labs coag  Recent Labs  04/04/16 2039  INR 1.08     Recent Labs  04/04/16 1717  NA 141  K  4.5  CL 104  CO2 27  GLUCOSE 133*  BUN 38*  CREATININE 1.22  CALCIUM 9.9    Physical Exam: Filed Vitals:   04/05/16 0011 04/05/16 0402  BP: 119/43 121/45  Pulse: 92 89  Temp: 99.3 F (37.4 C) 99.5 F (37.5 C)  Resp: 24 24   General: Alert, no acute distress.  Wife and daughter at bedside. Neurologic: No gross focal findings or movement disorder appreciated. Respiratory: No cyanosis, no use of accessory musculature Cardiovascular: No pedal edema GI: Abdomen is soft and non-tender, non-distended. Skin: Warm and dry.  No lesions in the area of chief complaint  Extremities: Warm and well perfused w/o edema Psychiatric: Patient is competent for consent with normal mood and affect  MUSCULOSKELETAL:  Hip mildly tender laterally.  Pain w/ movement   NVI.  Sensation intact distally. Other extremities are atraumatic with painless ROM and NVI.  Albina Billet III PA-C 04/05/2016 7:02 AM

## 2016-04-05 NOTE — Progress Notes (Addendum)
Initial Nutrition Assessment  INTERVENTION:  Ensure Enlive po BID, each supplement provides 350 kcal and 20 grams of protein   RD continue to follow after surgery to assess adequacy of intake  NUTRITION DIAGNOSIS:  Increased nutrient needs related to acute injury (hip fx) AEB est needs  GOAL:  Pt to meet >/= 90% of their estimated nutrition needs     MONITOR:  Po intake, labs and wt trends     REASON FOR ASSESSMENT: . Assess status and nutrition requirements     ASSESSMENT:   Eric Berger is a 82 y.o. male with medical history significant of hypertension, hyperlipidemia, GERD, stroke, diastolic congestive heart failure, who presents with right hip pain after fall. Closed hip fx per ortho. Right hip hemiarthroplasty planned for tomorrow. His diet is advanced to full liquids for now then he'll be NPO after midnight.   Patient says his appetite is good and he is able to feed himself. He lives with his wife. She is the one who prepares their meals and shops for food.  NFPE: mild muscle wasting expected with his age. Mild edema to right lower extremity.  Recent Labs Lab 04/04/16 1717 04/05/16 1218  NA 141  --   K 4.5  --   CL 104  --   CO2 27  --   BUN 38*  --   CREATININE 1.22 1.13  CALCIUM 9.9  --   GLUCOSE 133*  --   Labs reviewed.   Diet Order:  Diet full liquid Room service appropriate?: Yes; Fluid consistency:: Thin  Skin:    dry intact   Last BM:   prior to admission  Height:   Ht Readings from Last 1 Encounters:  04/04/16 5\' 5"  (1.651 m)    Weight:   Wt Readings from Last 1 Encounters:  04/04/16 152 lb 9.6 oz (69.219 kg)    Ideal Body Weight:  61.8 kg  BMI:  Body mass index is 25.39 kg/(m^2).  Estimated Nutritional Needs:   Kcal:  2070-2200   Protein:  82-90 gr  Fluid:  >2.0 liters daily  EDUCATION NEEDS:     Royann ShiversLynn Collan Schoenfeld MS,RD,CSG,LDN Office: #[phone] Pager: [phone]

## 2016-04-06 DIAGNOSIS — R112 Nausea with vomiting, unspecified: Secondary | ICD-10-CM

## 2016-04-06 LAB — BASIC METABOLIC PANEL
Anion gap: 9 (ref 5–15)
BUN: 26 mg/dL — ABNORMAL HIGH (ref 6–20)
CO2: 24 mmol/L (ref 22–32)
Calcium: 8.7 mg/dL — ABNORMAL LOW (ref 8.9–10.3)
Chloride: 102 mmol/L (ref 101–111)
Creatinine, Ser: 1.02 mg/dL (ref 0.61–1.24)
GFR calc Af Amer: >60 mL/min (ref >60)
GFR calc non Af Amer: >60 mL/min (ref >60)
Glucose, Bld: 122 mg/dL — ABNORMAL HIGH (ref 65–99)
Potassium: 3.9 mmol/L (ref 3.5–5.1)
Sodium: 135 mmol/L (ref 135–145)

## 2016-04-06 LAB — CBC
HEMATOCRIT: 34.9 % — AB (ref 39.0–52.0)
HEMOGLOBIN: 11.9 g/dL — AB (ref 13.0–17.0)
MCH: 30.8 pg (ref 26.0–34.0)
MCHC: 34.1 g/dL (ref 30.0–36.0)
MCV: 90.4 fL (ref 78.0–100.0)
Platelets: 189 10*3/uL (ref 150–400)
RBC: 3.86 MIL/uL — ABNORMAL LOW (ref 4.22–5.81)
RDW: 12.5 % (ref 11.5–15.5)
WBC: 11 10*3/uL — ABNORMAL HIGH (ref 4.0–10.5)

## 2016-04-06 MED ORDER — HYDROCODONE-ACETAMINOPHEN 5-325 MG PO TABS: 1.0000 | ORAL_TABLET | Freq: Four times a day (QID) | ORAL | Status: DC | PRN

## 2016-04-06 MED ORDER — ENOXAPARIN SODIUM 40 MG/0.4ML ~~LOC~~ SOLN: 40.0000 mg | SUBCUTANEOUS | Status: DC

## 2016-04-06 MED ORDER — MAGNESIUM OXIDE 400 (241.3 MG) MG PO TABS
200.0000 mg | ORAL_TABLET | Freq: Every day | ORAL | Status: DC
  Administered 2016-04-06 – 2016-04-08 (×3): 200 mg via ORAL
  Filled 2016-04-06 (×3): qty 1

## 2016-04-06 NOTE — Progress Notes (Addendum)
Occupational Therapy Evaluation Patient Details Name: Eric Berger MRN: [id number] DOB: 1926-08-30 Today's Date: 04/06/2016    History of Present Illness 82 y.o. male with medical history significant of hypertension, hyperlipidemia, GERD, stroke, diastolic congestive heart failure, who presents with right hip pain after fall. Now s/p R hi hemiarthroplasty, WBAT   Clinical Impression   PTA pt independent with ADL and mobility. Pt states he fell when he was putting a weed eater up because a lizard scared him. Pt unable to recall any hip precautions from earlier PT session. Pt currently requires Max A with mobility. Used stedy with pt which worked well - nsg notified. Pt requires Max A with LB ADL. Per son, pt has to have THICKET in all liquids to "buttermilk" consistency. Nsg and Nsg tech notified. Pt will need rehab at SNF. Will follow acutely to maximize functional level of independence.     Follow Up Recommendations  SNF;Supervision/Assistance - 24 hour    Equipment Recommendations  Other (comment) (TBA at SNF)    Recommendations for Other Services       Precautions / Restrictions Precautions Precautions: Fall;Posterior Hip Precaution Booklet Issued: Yes (comment) Precaution Comments: Educated in Post Hip Prec; will need reinforcement Required Braces or Orthoses: Other Brace/Splint (abduction ewedge) Restrictions Other Position/Activity Restrictions: WBAT      Mobility Bed Mobility Overal bed mobility: Needs Assistance Bed Mobility: Supine to Sit     Supine to sit: Mod assist;HOB elevated     General bed mobility comments: Max assist of 2 to scoot hips to EOB in prep for sitting up; heavy mod assist to elevate trunk to sitting; close monitor for post hip prec  Transfers Overall transfer level: Needs assistance Equipment used: Rolling walker (2 wheeled) Transfers: Sit to/from Stand Sit to Stand: From elevated surface;Max assist         General transfer  comment: Mod assist to power up; cues for hand placement and safety    Balance Overall balance assessment: History of Falls                                          ADL Overall ADL's : Needs assistance/impaired Eating/Feeding: Supervision/ safety Eating/Feeding Details (indicate cue type and reason): nectar thick liquids per son Grooming: Set up   Upper Body Bathing: Set up   Lower Body Bathing: Moderate assistance;Sit to/from stand   Upper Body Dressing : Minimal assistance   Lower Body Dressing: Maximal assistance;Sit to/from stand   Toilet Transfer: Total assistance (use of lift equipment)  Unaware of being incontinent of urine           Functional mobility during ADLs: +2 for physical assistance;Maximal assistance General ADL Comments: Pt unablet o recall hip precuations after 2 min delay     Vision     Perception     Praxis      Pertinent Vitals/Pain Pain Assessment: Faces Faces Pain Scale: Hurts little more Pain Location: R hip Pain Descriptors / Indicators: Grimacing Pain Intervention(s): Limited activity within patient's tolerance     Hand Dominance Right   Extremity/Trunk Assessment Upper Extremity Assessment Upper Extremity Assessment: Generalized weakness   Lower Extremity Assessment Lower Extremity Assessment: Generalized weakness RLE Deficits / Details: Grossly decr AROM and strength, limited by pain postop   Cervical / Trunk Assessment Cervical / Trunk Assessment: Kyphotic   Communication Communication Communication: HOH   Cognition Arousal/Alertness:  Awake/alert Behavior During Therapy: WFL for tasks assessed/performed Overall Cognitive Status: History of cognitive impairments - at baseline                     General Comments       Exercises Exercises: Total Joint     Shoulder Instructions      Home Living Family/patient expects to be discharged to:: Skilled nursing facility Living Arrangements:  Spouse/significant other;Children Available Help at Discharge: Family;Available 24 hours/day Type of Home: House Home Access: Stairs to enter [employer] of Steps: 3 Entrance Stairs-Rails: Right;Left Home Layout: One level     Bathroom Shower/Tub: Chief Strategy Officer: Standard     Home Equipment: Shower seat;Bedside commode;Walker - 2 wheels;Hospital bed;Cane - single point (taken from chart review )          Prior Functioning/Environment Level of Independence: Independent;Needs assistance      Communication / Swallowing Assistance Needed: per son pt has to have all liquids thickened to nectar consistency      OT Diagnosis: Generalized weakness;Cognitive deficits;Acute pain   OT Problem List: Decreased strength;Decreased range of motion;Decreased activity tolerance;Impaired balance (sitting and/or standing);Decreased cognition;Decreased safety awareness;Decreased knowledge of use of DME or AE;Decreased knowledge of precautions;Pain   OT Treatment/Interventions: Self-care/ADL training;Therapeutic exercise;DME and/or AE instruction;Therapeutic activities;Patient/family education;Balance training    OT Goals(Current goals can be found in the care plan section) Acute Rehab OT Goals Patient Stated Goal: to get back to walking OT Goal Formulation: With patient/family Time For Goal Achievement: 04/20/16 Potential to Achieve Goals: Good ADL Goals Pt Will Perform Lower Body Bathing: with min assist;with adaptive equipment;sit to/from stand Pt Will Transfer to Toilet: with min assist;bedside commode;stand pivot transfer Pt Will Perform Toileting - Clothing Manipulation and hygiene: with min assist;sit to/from stand Additional ADL Goal #1: Pt will recall 3/3 posterior hip precuations with min vc  OT Frequency: Min 2X/week   Barriers to D/C:            Co-evaluation              End of Session Equipment Utilized During Treatment: Gait belt Nurse  Communication: Mobility status;Need for lift equipment;Precautions;Other (comment) (need for nectar thick liquids)  Activity Tolerance: Patient tolerated treatment well Patient left: in chair;with call bell/phone within reach;with chair alarm set;with family/visitor present   Time: 1610-9604 OT Time Calculation (min): 20 min Charges:  OT General Charges $OT Visit: 1 Procedure OT Evaluation $OT Eval Moderate Complexity: 1 Procedure G-Codes:    Eric Berger,Eric Berger 04/14/2016, 4:59 PM   [facility], OTR/L  [phone] 14-Apr-2016

## 2016-04-06 NOTE — NC FL2 (Signed)
[place] MEDICAID FL2 LEVEL OF CARE SCREENING TOOL     IDENTIFICATION  Patient Name: Eric IvanWilliam H Berger Birthdate: March 01, 1926 Sex: male Admission Date (Current Location): 04/04/2016  [facility] [place]   Facility and Address:  [facility] [place], [address], [place], [place] [address]      Provider Number: [id number]  Attending Physician Name and Address:  Meredeth IdeGagan S Lama, MD  Relative Name and Phone Number:       Current Level of Care: Hospital Recommended Level of Care: Skilled Nursing Facility Prior Approval Number:    Date Approved/Denied:   PASRR Number:   [id number]   Discharge Plan: SNF    Current Diagnoses: Patient Active Problem List   Diagnosis Date Noted  . Closed right hip fracture (HCC) 04/04/2016  . Nausea & vomiting 04/04/2016  . Stroke (HCC)   . High cholesterol   . Chronic diastolic CHF (congestive heart failure) (HCC)   . Dysphagia 11/23/2015  . Mitral regurgitation 09/29/2015  . CAP (community acquired pneumonia) 09/23/2015  . ARF (acute renal failure) (HCC) 09/23/2015  . HTN (hypertension) 11/07/2014  . Encounter for long-term (current) use of other high-risk medications 11/07/2014  . G E R D 12/02/2007  . ASPIRATION FOREIGN BODY 12/02/2007    Orientation RESPIRATION BLADDER Height & Weight     Self, Time, Situation, Place  O2 (2l/min) Incontinent Weight: 152 lb 9.6 oz (69.219 kg) Height:  5\' 5"  (165.1 cm)  BEHAVIORAL SYMPTOMS/MOOD NEUROLOGICAL BOWEL NUTRITION STATUS   (None)  (None) Continent Diet (regular)  AMBULATORY STATUS COMMUNICATION OF NEEDS Skin   Extensive Assist Verbally Surgical wounds                       Personal Care Assistance Level of Assistance  Bathing, Feeding, Dressing Bathing Assistance: Limited assistance Feeding assistance: Independent Dressing Assistance: Limited assistance     Functional Limitations Info  Sight, Hearing, Speech Sight Info: Adequate Hearing  Info: Adequate Speech Info: Adequate    SPECIAL CARE FACTORS FREQUENCY  PT (By licensed PT)     PT Frequency:  (6x/week)              Contractures Contractures Info: Not present    Additional Factors Info  Code Status, Allergies Code Status Info:  (full) Allergies Info:  (NKDA)           Current Medications (04/06/2016):  This is the current hospital active medication list Current Facility-Administered Medications  Medication Dose Route Frequency Provider Last Rate Last Dose  . aspirin tablet 325 mg  325 mg Oral Daily Lorretta HarpXilin Niu, MD   325 mg at 04/06/16 0957  . cholecalciferol (VITAMIN D) tablet 1,000 Units  1,000 Units Oral Daily Lorretta HarpXilin Niu, MD   1,000 Units at 04/06/16 0957  . dextrose 5 %-0.45 % sodium chloride infusion   Intravenous Continuous Meredeth IdeGagan S Lama, MD      . docusate sodium (COLACE) capsule 100 mg  100 mg Oral BID Meredeth IdeGagan S Lama, MD   100 mg at 04/06/16 0957  . enoxaparin (LOVENOX) injection 40 mg  40 mg Subcutaneous Q24H Meredeth IdeGagan S Lama, MD   40 mg at 04/06/16 0957  . fentaNYL (SUBLIMAZE) injection 25 mcg  25 mcg Intravenous Q1H PRN Lyndal Pulleyaniel Knott, MD   25 mcg at 04/04/16 1914  . hydrALAZINE (APRESOLINE) injection 5 mg  5 mg Intravenous Q2H PRN Lorretta HarpXilin Niu, MD      . hydrochlorothiazide (HYDRODIURIL) tablet 25  mg  25 mg Oral Daily Lorretta Harp, MD   25 mg at 04/06/16 0957  . HYDROcodone-acetaminophen (NORCO/VICODIN) 5-325 MG per tablet 1-2 tablet  1-2 tablet Oral Q4H PRN Meredeth Ide, MD      . losartan (COZAAR) tablet 50 mg  50 mg Oral Daily Lorretta Harp, MD   50 mg at 04/06/16 0958  . magnesium oxide (MAG-OX) tablet 200 mg  200 mg Oral Daily Meredeth Ide, MD   200 mg at 04/06/16 0958  . menthol-cetylpyridinium (CEPACOL) lozenge 3 mg  1 lozenge Oral PRN Meredeth Ide, MD       Or  . phenol (CHLORASEPTIC) mouth spray 1 spray  1 spray Mouth/Throat PRN Meredeth Ide, MD      . methocarbamol (ROBAXIN) tablet 500 mg  500 mg Oral Q6H PRN Lorretta Harp, MD       Or  . methocarbamol  (ROBAXIN) 500 mg in dextrose 5 % 50 mL IVPB  500 mg Intravenous Q6H PRN Lorretta Harp, MD      . metoCLOPramide (REGLAN) tablet 5-10 mg  5-10 mg Oral Q8H PRN Meredeth Ide, MD       Or  . metoCLOPramide (REGLAN) injection 5-10 mg  5-10 mg Intravenous Q8H PRN Meredeth Ide, MD      . morphine 2 MG/ML injection 1 mg  1 mg Intravenous Q2H PRN Meredeth Ide, MD      . morphine 2 MG/ML injection 2 mg  2 mg Intravenous Q4H PRN Lorretta Harp, MD      . multivitamin with minerals tablet 1 tablet  1 tablet Oral Daily Lorretta Harp, MD   1 tablet at 04/06/16 0957  . ondansetron (ZOFRAN) injection 4 mg  4 mg Intravenous Q8H PRN Lorretta Harp, MD      . oxyCODONE-acetaminophen (PERCOCET/ROXICET) 5-325 MG per tablet 1 tablet  1 tablet Oral Q4H PRN Lorretta Harp, MD      . senna-docusate (Senokot-S) tablet 1 tablet  1 tablet Oral QHS PRN Lorretta Harp, MD      . simvastatin (ZOCOR) tablet 40 mg  40 mg Oral q1800 Lorretta Harp, MD         Discharge Medications: Please see discharge summary for a list of discharge medications.  Relevant Imaging Results:  Relevant Lab Results:   Additional Information [id number]  Terald Sleeper, LCSW

## 2016-04-06 NOTE — Progress Notes (Signed)
   Assessment: 1 Day Post-Op  S/P Procedure(s) (LRB): RIGHT HIP HEMIARTHROPLASTY (Right)  by Dr. Jewel Baizeimothy D. Eulah PontMurphy on 04/05/16  Principal Problem:   Closed right hip fracture (HCC) Active Problems:   G E R D   HTN (hypertension)   Stroke (HCC)   High cholesterol   Chronic diastolic CHF (congestive heart failure) (HCC)   Nausea & vomiting  ADDITIONAL DIAGNOSIS:  Expected Acute Blood Loss Anemia, likely w/ dilutional component.  H/H stable.  Stable from an orthopedic perspective.  PT evaluation pending.  Will need ongoing care /  rehab beyond scope of that which he could get at home.  Plan: Up with therapy  Weight Bearing: Weight Bearing as Tolerated (WBAT) Right leg.  Posterior Hip Precautions. Dressings: prn.  VTE prophylaxis: Lovenox, SCDs, ambulation.  Takes ASA chronically. Dispo: Skilled Nursing Facility/Rehab  Subjective: Patient reports pain as mild. Pain controlled.  Tolerating diet.  Urinating. No CP, SOB.  Not yet OOB.  Objective:   VITALS:   Filed Vitals:   04/05/16 1156 04/05/16 2204 04/06/16 0104 04/06/16 0524  BP: 100/44 132/52 121/44 126/46  Pulse: 84 88 81 84  Temp: 98.7 F (37.1 C) 98.2 F (36.8 C) 98.7 F (37.1 C) 98.5 F (36.9 C)  TempSrc: Oral Oral Oral Oral  Resp: 16 16 16 16   Height:      Weight:      SpO2: 100% 97% 96% 97%    Lab Results  Component Value Date   WBC 11.0* 04/06/2016   HGB 11.9* 04/06/2016   HCT 34.9* 04/06/2016   MCV 90.4 04/06/2016   PLT 189 04/06/2016    Physical Exam General: NAD.  Upright in bed. MSK: Neurovascular intact Sensation intact distally Intact pulses distally Dorsiflexion/Plantar flexion intact Incision: dressing C/D/I and no drainage   Albina BilletHenry Calvin Martensen III 04/06/2016, 8:31 AM

## 2016-04-06 NOTE — Clinical Social Work Placement (Signed)
   CLINICAL SOCIAL WORK PLACEMENT  NOTE  Date:  04/06/2016  Patient Details  Name: Eric Berger MRN: [id number] Date of Birth: July 08, 1926  Clinical Social Work is seeking post-discharge placement for this patient at the Skilled  Nursing Facility level of care (*CSW will initial, date and re-position this form in  chart as items are completed):  Yes   Patient/family provided with [facility] Clinical Social Work Department's list of facilities offering this level of care within the geographic area requested by the patient (or if unable, by the patient's family).  Yes   Patient/family informed of their freedom to choose among providers that offer the needed level of care, that participate in Medicare, Medicaid or managed care program needed by the patient, have an available bed and are willing to accept the patient.  Yes   Patient/family informed of [facility]'s ownership interest in [facility] and [facility], as well as of the fact that they are under no obligation to receive care at these facilities.  PASRR submitted to EDS on       PASRR number received on       Existing PASRR number confirmed on 04/06/16     FL2 transmitted to all facilities in geographic area requested by pt/family on 04/06/16     FL2 transmitted to all facilities within larger geographic area on       Patient informed that his/her managed care company has contracts with or will negotiate with certain facilities, including the following:            Patient/family informed of bed offers received.  Patient chooses bed at       Physician recommends and patient chooses bed at      Patient to be transferred to   on  .  Patient to be transferred to facility by       Patient family notified on   of transfer.  Name of family member notified:        PHYSICIAN Please prepare priority discharge summary, including medications, Please prepare prescriptions, Please sign FL2     Additional Comment:     _______________________________________________ Terald Sleeperakiyah T Locklyn Henriquez, LCSW 04/06/2016, 3:28 PM

## 2016-04-06 NOTE — Progress Notes (Signed)
[facility]  PROGRESS NOTE  Eric Berger YNW:[id number] DOB: April 14, 1926 DOA: 04/04/2016 PCP: Leo Grosser, MD    Brief HPI:  82 y.o. male with medical history significant of hypertension, hyperlipidemia, GERD, stroke, diastolic congestive heart failure, who presents with right hip pain after fall.  Pt states that he fell when he went into the tool shop, was started by a lizard, lost his balance, fell to his rt side and injured his right hip. He developed moderate right hip pain, which is constant, nonradiating. Patient does not have numbness in his right leg. Patient denies head injury or neck injury. No headache or neck pain. Patient has nausea, and vomited when he had x-ray chest. Patient denies chest pain, shortness breath, cough, abdominal pain, diarrhea, symptoms of a UTI or unilateral weakness.  Principal Problem:   Closed right hip fracture (HCC) Active Problems:   G E R D   HTN (hypertension)   Stroke (HCC)   High cholesterol   Chronic diastolic CHF (congestive heart failure) (HCC)   Nausea & vomiting   Assessment/Plan:  Closed right hip fracture (HCC): As evidenced by x-ray. S/p right hip hemiarthroplasty. Orthopedics following.  Leukocytosis:  resolved , wbc down to 11,000. Likely due to stress-induced demargination. Patient does not have signs of infection.  Nausea & vomiting:  resolved, lipase is 25. -prn Zofran for nausea  HTN: -continue home HCTZ, Cozaar -IV hydralazine when necessary  HLD: Last LDL was 70 on 03/28/16 -Continue home medications: Zocor  Hx of stroke: no acute new issues. -continue ASA and zocor  Chronic diastolic CHF (congestive heart failure) (HCC): 2-D echo 09/29/15 showed EF 55%. No leg edema or JVD.  CHF is compensated. -Continue HCTZ and aspirin   DVT prophylaxis: Lovenox Code Status: Full code Family Communication: no family at bedside Disposition Plan: SNF   Consultants:  Orthopedics   Procedures:  Right  hip hemiarthroplasty  Antibiotics:  None   Subjective: Patient seen and examined, s/p right hip hemiarthroplasty. Denies pain at this time. WBC down to 11,000.  Objective: Filed Vitals:   04/05/16 1156 04/05/16 2204 04/06/16 0104 04/06/16 0524  BP: 100/44 132/52 121/44 126/46  Pulse: 84 88 81 84  Temp: 98.7 F (37.1 C) 98.2 F (36.8 C) 98.7 F (37.1 C) 98.5 F (36.9 C)  TempSrc: Oral Oral Oral Oral  Resp: Height:      Weight:      SpO2: 100% 97% 96% 97%   No intake or output data in the 24 hours ending 04/06/16 1217 Filed Weights   04/04/16 1614 04/04/16 1932  Weight: 68.493 kg (151 lb) 69.219 kg (152 lb 9.6 oz)    Examination:  General exam: Appears calm and comfortable  Respiratory system: Clear to auscultation. Respiratory effort normal. Cardiovascular system: S1 & S2 heard, RRR. No JVD, murmurs, rubs, gallops or clicks. No pedal edema. Gastrointestinal system: Abdomen is nondistended, soft and nontender. No organomegaly or masses felt. Normal bowel sounds heard. Central nervous system: Alert and oriented. No focal neurological deficits. Extremities: Symmetric 5 x 5 power. Skin: No rashes, lesions or ulcers Psychiatry: Judgement and insight appear normal. Mood & affect appropriate.    Data Reviewed: I have personally reviewed following labs and imaging studies Basic Metabolic Panel:  Recent Labs Lab 04/04/16 1717 04/05/16 1218 04/06/16 0300  NA 141  --  135  K 4.5  --  3.9  CL 104  --  102  CO2 27  --  24  GLUCOSE 133*  --  122*  BUN 38*  --  26*  CREATININE 1.22 1.13 1.02  CALCIUM 9.9  --  8.7*    Recent Labs Lab 04/04/16 2039  LIPASE 25   No results for input(s): AMMONIA in the last 168 hours. CBC:  Recent Labs Lab 04/04/16 1717 04/05/16 1218 04/06/16 0300  WBC 14.3* 14.8* 11.0*  NEUTROABS 11.9*  --   --   HGB 14.4 12.0* 11.9*  HCT 41.6 35.1* 34.9*  MCV 90.8 91.6 90.4  PLT 255 202 189   Cardiac Enzymes: No results for  input(s): CKTOTAL, CKMB, CKMBINDEX, TROPONINI in the last 168 hours. BNP (last 3 results)  Recent Labs  09/30/15 1619 04/05/16 0612  BNP 803.4* 171.4*      Recent Results (from the past 240 hour(s))  MRSA PCR Screening     Status: None   Collection Time: 04/05/16  4:16 AM  Result Value Ref Range Status   MRSA by PCR NEGATIVE NEGATIVE Final    Comment:        The GeneXpert MRSA Assay (FDA approved for NASAL specimens only), is one component of a comprehensive MRSA colonization surveillance program. It is not intended to diagnose MRSA infection nor to guide or monitor treatment for MRSA infections.      Studies: Dg Pelvis Portable  04/05/2016  CLINICAL DATA:  Status post right hip replacement. EXAM: PORTABLE PELVIS 1-2 VIEWS COMPARISON:  CT scan 04/04/2016. FINDINGS: Patient is status post right hip hemiarthroplasty. No evidence for hardware complications. Gas in the overlying soft tissues compatible with the immediate postoperative state. IMPRESSION: Status post right hip hemiarthroplasty without complicating features. Electronically Signed   By: Kennith CenterEric  Mansell M.D.   On: 04/05/2016 15:07   Ct Hip Right Wo Contrast  04/04/2016  CLINICAL DATA:  82 year old male with fall and right hip pain. EXAM: CT OF THE RIGHT HIP WITHOUT CONTRAST TECHNIQUE: Multidetector CT imaging of the right hip was performed according to the standard protocol. Multiplanar CT image reconstructions were also generated. COMPARISON:  First radiograph dated 04/04/2016 and CT of the abdomen pelvis dated 09/24/2015 FINDINGS: There is advanced osteopenia which limits evaluation for fracture. There is a transverse fracture of the right femoral neck. There is foreshortening of the femoral neck with mild impaction. There is sharp edge of the fracture a along the medial cortex of the left femoral neck. This fracture is new compared the CT dated 09/24/2015 and likely represents an acute fracture. There is no dislocation.  There is no joint effusion. The soft tissues appear unremarkable. IMPRESSION: Nondisplaced mildly impacted transverse fracture of the right femoral neck. This fracture is new from prior study 01/02/2016 and likely acute. No dislocation. Electronically Signed   By: Elgie CollardArash  Radparvar M.D.   On: 04/04/2016 18:27   Dg Hip Unilat With Pelvis 2-3 Views Right  04/04/2016  CLINICAL DATA:  Right hip pain after fall. EXAM: DG HIP (WITH OR WITHOUT PELVIS) 2-3V RIGHT COMPARISON:  None. FINDINGS: The right femoral neck appears foreshortened and has a different configuration when compared with scout radiograph from CT dated 09/24/2015. Findings are suspicious for an impacted subcapital femoral neck fracture. Left hip appears located and intact. IMPRESSION: 1. Cannot rule out subcapital femoral neck fracture involving the right hip. Further investigation with either MRI or CT is recommended. These results will be called to the ordering clinician or representative by the Radiologist Assistant, and communication documented in the PACS or zVision Dashboard. Electronically Signed   By: Ladona Ridgelaylor  Bradly Chris M.D.   On: 04/04/2016 17:17    Scheduled Meds: . aspirin  325 mg Oral Daily  . cholecalciferol  1,000 Units Oral Daily  . docusate sodium  100 mg Oral BID  . enoxaparin (LOVENOX) injection  40 mg Subcutaneous Q24H  . hydrochlorothiazide  25 mg Oral Daily  . losartan  50 mg Oral Daily  . magnesium oxide  200 mg Oral Daily  . multivitamin with minerals  1 tablet Oral Daily  . simvastatin  40 mg Oral q1800   Continuous Infusions: . dextrose 5 % and 0.45% NaCl      Time spent: 25 min    [facility]  [facility] Pager [phone]. If 7PM-7AM, please contact night-coverage at [email or web address], Office  [phone]  password [email or web address] 04/06/2016, 12:17 PM  LOS: 2 days

## 2016-04-06 NOTE — Evaluation (Signed)
Physical Therapy Evaluation Patient Details Name: Eric Berger MRN: [id number] DOB: 1926/03/06 Today's Date: 04/06/2016   History of Present Illness  82 y.o. male with medical history significant of hypertension, hyperlipidemia, GERD, stroke, diastolic congestive heart failure, who presents with right hip pain after fall. Now s/p R hi hemiarthroplasty, WBAT  Clinical Impression  Patient is s/p above surgery resulting in functional limitations due to the deficits listed below (see PT Problem List).  Patient will benefit from skilled PT to increase their independence and safety with mobility to allow discharge to the venue listed below.       Follow Up Recommendations Other (comment);SNF (May progress well enough to dc home; will monitor)    Equipment Recommendations  Rolling walker with 5" wheels;3in1 (PT) (He likely already has)    Recommendations for Other Services OT consult     Precautions / Restrictions Precautions Precautions: Fall;Posterior Hip Precaution Booklet Issued: Yes (comment) Precaution Comments: Educated in Post Hip Prec; will need reinforcement      Mobility  Bed Mobility Overal bed mobility: Needs Assistance Bed Mobility: Supine to Sit     Supine to sit: Max assist;+2 for physical assistance     General bed mobility comments: Max assist of 2 to scoot hips to EOB in prep for sitting up; heavy mod assist to elevate trunk to sitting; close monitor for post hip prec  Transfers Overall transfer level: Needs assistance Equipment used: Rolling walker (2 wheeled) Transfers: Sit to/from Stand Sit to Stand: Mod assist;+2 physical assistance         General transfer comment: Mod assist to power up; cues for hand placement and safety  Ambulation/Gait Ambulation/Gait assistance: Mod assist;+2 safety/equipment Ambulation Distance (Feet): 6 Feet Assistive device: Rolling walker (2 wheeled) Gait Pattern/deviations: Step-to pattern (almost step-to)      General Gait Details: Cues for gait sequence and technqiue; Not tolerating much weight on RLE, resulting in very short steps LLE; physical assist to advance RLE  Stairs            Wheelchair Mobility    Modified Rankin (Stroke Patients Only)       Balance                                             Pertinent Vitals/Pain Pain Assessment: Faces Faces Pain Scale: Hurts even more Pain Location: R hip with movement Pain Descriptors / Indicators: Grimacing;Guarding Pain Intervention(s): Limited activity within patient's tolerance;Monitored during session;Repositioned    Home Living Family/patient expects to be discharged to:: Private residence Living Arrangements: Spouse/significant other;Children Available Help at Discharge: Family;Available 24 hours/day Type of Home: House Home Access: Stairs to enter Entrance Stairs-Rails: Doctor, general practiceight;Left Entrance Stairs-Number of Steps: 3 Home Layout: One level Home Equipment: Shower seat;Bedside commode;Walker - 2 wheels;Hospital bed;Cane - single point (taken from chart review )      Prior Function Level of Independence: Independent               Hand Dominance   Dominant Hand: Right    Extremity/Trunk Assessment   Upper Extremity Assessment: Generalized weakness           Lower Extremity Assessment: Generalized weakness;RLE deficits/detail RLE Deficits / Details: Grossly decr AROM and strength, limited by pain postop       Communication   Communication: HOH  Cognition Arousal/Alertness: Awake/alert Behavior During Therapy: WFL for tasks assessed/performed  Overall Cognitive Status: Within Functional Limits for tasks assessed                      General Comments      Exercises Total Joint Exercises Ankle Circles/Pumps: AROM;Right;Left;10 reps Quad Sets: AROM;Right;5 reps Heel Slides: AAROM;Right;5 reps      Assessment/Plan    PT Assessment Patient needs continued PT  services  PT Diagnosis Difficulty walking;Acute pain   PT Problem List Decreased strength;Decreased range of motion;Decreased activity tolerance;Decreased balance;Decreased mobility;Decreased coordination;Decreased knowledge of use of DME;Decreased safety awareness;Decreased knowledge of precautions;Pain  PT Treatment Interventions DME instruction;Gait training;Stair training;Functional mobility training;Therapeutic activities;Therapeutic exercise;Patient/family education;Balance training   PT Goals (Current goals can be found in the Care Plan section) Acute Rehab PT Goals Patient Stated Goal: Did not state PT Goal Formulation: With patient Time For Goal Achievement: 04/20/16 Potential to Achieve Goals: Good    Frequency Min 6X/week   Barriers to discharge        Co-evaluation               End of Session Equipment Utilized During Treatment: Gait belt Activity Tolerance: Patient tolerated treatment well Patient left: in chair;with call bell/phone within reach;with chair alarm set Nurse Communication: Mobility status         Time: 8295-6213 PT Time Calculation (min) (ACUTE ONLY): 25 min   Charges:   PT Evaluation $PT Eval Moderate Complexity: 1 Procedure PT Treatments $Gait Training: 8-22 mins   PT G Codes:        Olen Pel 04/06/2016, 1:43 PM  Van Cl[place], [place]  Acute Rehabilitation Services Pager [phone] Office [phone]

## 2016-04-06 NOTE — Clinical Social Work Note (Signed)
Clinical Social Work Assessment  Patient Details  Name: Eric Berger MRN: [id number] Date of Birth: 08/27/1926  Date of referral:  04/06/16               Reason for consult:  Discharge Planning                Permission sought to share information with:  Case Manager, Facility Sport and exercise psychologist, Family Supports Permission granted to share information::  Yes, Verbal Permission Granted, No  Name::        Agency::   (SNF)  Relationship::     Contact Information:     Housing/Transportation Living arrangements for the past 2 months:  Single Family Home Source of Information:  Medical Team, Spouse Patient Interpreter Needed:  None Criminal Activity/Legal Involvement Pertinent to Current Situation/Hospitalization:  No - Comment as needed Significant Relationships:  Adult Children, Spouse Lives with:  Spouse Do you feel safe going back to the place where you live?  No Need for family participation in patient care:  Yes (Comment)  Care giving concerns: Pt unable to participate in assessment pt confused. Pt wife informed CSW that she will be having surgery soon and would like for pt to go to SNF close to their home. Pt wife requested [facility] as first choice for SNF.    Social Worker assessment / plan: Holiday representative met with pt and pt wife and discussed CSW role with discharge planning. Pt was some what confused. CSW informed pt wife and daughter of PT recommendation for SNF for short term rehab. Pt wife agreeable to SNF. Pt wife also requested [facility] for SNF so that pt is closer to home.   CSW will follow up with bed offers once available.   Employment status:  Retired Forensic scientist:  Medicare PT Recommendations:    Information / Referral to community resources:  [place]  Patient/Family's Response to care: Pt sitting in recliner next to bed. Pt wife and daughter also in the room with pt during assessment. Pt appeared confused.  However, Pt's wife appears happy with care pt is receiving at [facility].   Patient/Family's Understanding of and Emotional Response to Diagnosis, Current Treatment, and Prognosis: Pt wife appears to have an understanding of reason for pt admission into hospital and pt care plan.   Emotional Assessment Appearance:  Appears stated age Attitude/Demeanor/Rapport:   (Unable to Access) Affect (typically observed):  Unable to Assess Orientation:  Oriented to Self, Oriented to Place, Oriented to  Time, Oriented to Situation Alcohol / Substance use:  Not Applicable Psych involvement (Current and /or in the community):  No (Comment)  Discharge Needs  Concerns to be addressed:  Discharge Planning Concerns Readmission within the last 30 days:  No Current discharge risk:  None Barriers to Discharge:  Continued Medical Work up   [employer], LCSW 04/06/2016, 3:13 PM

## 2016-04-07 ENCOUNTER — Inpatient Hospital Stay (HOSPITAL_COMMUNITY): Payer: Commercial Managed Care - HMO

## 2016-04-07 ENCOUNTER — Encounter (HOSPITAL_COMMUNITY): Payer: Self-pay | Admitting: Orthopedic Surgery

## 2016-04-07 DIAGNOSIS — E7800 Pure hypercholesterolemia, unspecified: Secondary | ICD-10-CM

## 2016-04-07 LAB — URINALYSIS, ROUTINE W REFLEX MICROSCOPIC
BILIRUBIN URINE: NEGATIVE
Glucose, UA: NEGATIVE mg/dL
KETONES UR: NEGATIVE mg/dL
NITRITE: NEGATIVE
PROTEIN: 30 mg/dL — AB
Specific Gravity, Urine: 1.021 (ref 1.005–1.030)
pH: 6 (ref 5.0–8.0)

## 2016-04-07 LAB — CBC
HCT: 33 % — ABNORMAL LOW (ref 39.0–52.0)
HEMOGLOBIN: 11.4 g/dL — AB (ref 13.0–17.0)
MCH: 31.1 pg (ref 26.0–34.0)
MCHC: 34.5 g/dL (ref 30.0–36.0)
MCV: 90.2 fL (ref 78.0–100.0)
Platelets: 200 10*3/uL (ref 150–400)
RBC: 3.66 MIL/uL — AB (ref 4.22–5.81)
RDW: 12.5 % (ref 11.5–15.5)
WBC: 17.1 10*3/uL — AB (ref 4.0–10.5)

## 2016-04-07 LAB — URINE MICROSCOPIC-ADD ON

## 2016-04-07 NOTE — Clinical Documentation Improvement (Signed)
Hospitalist and/or Orthopedics  Please document query responses in the progress notes and discharge summary, not on the CDI BPA form in [facility]. Thank you!  Possible Clinical Conditions:  - Intraoperative Hypotension   - Other hypotension  - Other condition  - Unable to clinically determine  Clinical Information: "Phenylephrine gtt off" is documented by nursing on 04/05/16 at 10:56 am Patient was out of recovery per anesthesia notes at 1120 on 04/05/16. SBP less than 100 intraoperatively requiring 80 mg Neo bolus x 3  Neo gtt started at 0900 Ephedrine bolus x 2 - 0926 and 0928 250 ml of albumin at 0852 EBL 250 mls  Please exercise your independent, professional judgment when responding. A specific answer is not anticipated or expected.   Thank You, Jerral Ralphathy R Yahaira Bruski  RN BSN CCDS [phone] Health Information Management [place]

## 2016-04-07 NOTE — Clinical Social Work Note (Addendum)
Per MD, white blood count up. Patient not discharging today.  Charlynn CourtSarah Cumi Sanagustin, CSW [phone]  4:55 pm SNF and patient's wife notified.  Charlynn CourtSarah Jamorian Dimaria, CSW [phone]

## 2016-04-07 NOTE — Progress Notes (Signed)
MBSS complete. Full report located under chart review in imaging section. Cashmere Harmes, MA CCC-SLP [phone]  

## 2016-04-07 NOTE — Clinical Social Work Note (Addendum)
CSW met with patient's wife and daughter. Presented bed offers and they accepted at [facility]. Insurance authorization started. Facility notified. Patient's wife and daughter will go to SNF after lunch to start paperwork. Patient will need PTAR.  Dayton Scrape, [place]  4:02 pm Auth #: [id number]  Dayton Scrape, [place]

## 2016-04-07 NOTE — Clinical Social Work Placement (Signed)
   CLINICAL SOCIAL WORK PLACEMENT  NOTE  Date:  04/07/2016  Patient Details  Name: Eric Berger MRN: [id number] Date of Birth: Sep 17, 1926  Clinical Social Work is seeking post-discharge placement for this patient at the Skilled  Nursing Facility level of care (*CSW will initial, date and re-position this form in  chart as items are completed):  Yes   Patient/family provided with [place] Clinical Social Work Department's list of facilities offering this level of care within the geographic area requested by the patient (or if unable, by the patient's family).  Yes   Patient/family informed of their freedom to choose among providers that offer the needed level of care, that participate in Medicare, Medicaid or managed care program needed by the patient, have an available bed and are willing to accept the patient.  Yes   Patient/family informed of [name]'s ownership interest in [facility] and [facility], as well as of the fact that they are under no obligation to receive care at these facilities.  PASRR submitted to EDS on       PASRR number received on       Existing PASRR number confirmed on 04/06/16     FL2 transmitted to all facilities in geographic area requested by pt/family on 04/06/16     FL2 transmitted to all facilities within larger geographic area on       Patient informed that his/her managed care company has contracts with or will negotiate with certain facilities, including the following:        Yes   Patient/family informed of bed offers received.  [facility]     Physician recommends and patient chooses bed at      [facility] 04[facility]07[facility]16.  Patient to be transferred to facility by PTAR     Patient family notified on 04/07/16 of transfer.  Name of family member notified:  [name] and [name]     PHYSICIAN Please prepare prescriptions     Additional  Comment:    _______________________________________________ Margarito LinerSarah C Tabias Swayze, LCSW 04/07/2016, 11:09 AM

## 2016-04-07 NOTE — Progress Notes (Signed)
Physical Therapy Treatment Patient Details Name: Eric Berger MRN: [id number] DOB: 1926-09-01 Today's Date: 04/07/2016    History of Present Illness 82 y.o. male with medical history significant of hypertension, hyperlipidemia, GERD, stroke, diastolic congestive heart failure, who presents with right hip pain after fall. Now s/p R hi hemiarthroplasty, WBAT    PT Comments    Focused session on ROM/strengthening therex and education on precautions due to pt about to go off floor for MBS test.  Pt able to recall no adduction, but could not recall other precautions.  Recommend SNF and family in agreement.   Follow Up Recommendations  SNF     Equipment Recommendations  None recommended by PT    Recommendations for Other Services       Precautions / Restrictions Precautions Precautions: Fall;Posterior Hip Precaution Comments: Pt recalled 1/3 (no crossing) hip precautions Restrictions Weight Bearing Restrictions: Yes Other Position/Activity Restrictions: WBAT    Mobility  Bed Mobility               General bed mobility comments: Deferred due to pt going to test after PT session  Transfers                    Ambulation/Gait                 Stairs            Wheelchair Mobility    Modified Rankin (Stroke Patients Only)       Balance                                    Cognition Arousal/Alertness: Awake/alert Behavior During Therapy: WFL for tasks assessed/performed Overall Cognitive Status: History of cognitive impairments - at baseline       Memory: Decreased recall of precautions              Exercises Total Joint Exercises Ankle Circles/Pumps: AROM;Right;Left;10 reps Quad Sets: Strengthening;Right;10 reps;Supine Gluteal Sets: Strengthening;10 reps;Supine Heel Slides: AAROM;Left;10 reps;Supine Hip ABduction/ADduction: AAROM;Right;10 reps;Supine    General Comments General comments (skin integrity,  edema, etc.): Wife and daughter present and asking many questions re: d/c.  They were answered to their satisfaction.  SW also came in and spoke to them while PT worked with pt.      Pertinent Vitals/Pain Pain Assessment: No/denies pain Faces Pain Scale: Hurts little more Pain Location: R hip Pain Descriptors / Indicators: Grimacing Pain Intervention(s): Limited activity within patient's tolerance;Monitored during session    Home Living                      Prior Function            PT Goals (current goals can now be found in the care plan section) Acute Rehab PT Goals Patient Stated Goal: to get back to walking PT Goal Formulation: With patient Time For Goal Achievement: 04/20/16 Potential to Achieve Goals: Good Progress towards PT goals: Progressing toward goals    Frequency  Min 3X/week    PT Plan Current plan remains appropriate;Frequency needs to be updated    Co-evaluation             End of Session   Activity Tolerance: Patient tolerated treatment well Patient left: in bed;with bed alarm set;with family/visitor present;with call bell/phone within reach     Time: 1048-1110 PT Time Calculation (min) (ACUTE ONLY): 22 min  Charges:  $Therapeutic Exercise: 8-22 mins                    G Codes:      Eric Berger 04/07/2016, 11:18 AM

## 2016-04-07 NOTE — Progress Notes (Signed)
   Assessment: 2 Days Post-Op  S/P Procedure(s) (LRB): RIGHT HIP HEMIARTHROPLASTY (Right)  by Dr. Jewel Baizeimothy D. Eulah PontMurphy on 04/05/16  Principal Problem:   Closed right hip fracture (HCC) Active Problems:   G E R D   HTN (hypertension)   Stroke (HCC)   High cholesterol   Chronic diastolic CHF (congestive heart failure) (HCC)   Nausea & vomiting  ADDITIONAL DIAGNOSIS:  Expected Acute Blood Loss Anemia, likely w/ dilutional component.  H/H stable.  Improving from an orthopedic perspective.    Plan: Continue therapy  Weight Bearing: Weight Bearing as Tolerated (WBAT) Right leg.  Posterior Hip Precautions. Dressings: prn.  VTE prophylaxis: Lovenox, SCDs, ambulation.  Takes ASA chronically. Dispo: Skilled Nursing Facility/Rehab - Daughter and wife believe that he will need more assistance than they are able to provide at home.  Subjective: Patient reports pain as mild. Pain controlled.  Tolerating diet.  No fever, chills.  Urinating. No CP, SOB.  +flatus, no BM.  OOB with few steps in room.  Reviewed posterior hip precautions and deep breathing.  Objective:   VITALS:   Filed Vitals:   04/06/16 0524 04/06/16 1549 04/06/16 2044 04/07/16 0614  BP: 126/46 125/48 117/56 111/56  Pulse: 84 76 89 81  Temp: 98.5 F (36.9 C) 98 F (36.7 C) 98.3 F (36.8 C) 98.3 F (36.8 C)  TempSrc: Oral Oral Oral Oral  Resp: 16 16 16 16   Height:      Weight:      SpO2: 97% 95% 93% 95%    Physical Exam General: Conversant.  NAD.  Supine in bed. MSK: Neurovascular intact Sensation intact distally Intact pulses distally Dorsiflexion/Plantar flexion intact Incision: dressing C/D/I and no drainage   Eric Berger 04/07/2016, 7:07 AM

## 2016-04-07 NOTE — Evaluation (Signed)
Clinical/Bedside Swallow Evaluation Patient Details  Name: Eric Berger MRN: [id number] Date of Birth: 07/02/1926  Today's Date: 04/07/2016 Time: SLP Start Time (ACUTE ONLY): 0956 SLP Stop Time (ACUTE ONLY): 1014 SLP Time Calculation (min) (ACUTE ONLY): 18 min  Past Medical History:  Past Medical History  Diagnosis Date  . Stroke (HCC)   . Renal disorder   . Hernia   . High cholesterol   . Hearing loss   . GERD (gastroesophageal reflux disease)   . Cataracts, bilateral   . Hypertension   . Chronic diastolic CHF (congestive heart failure) [facility])    Past Surgical History:  Past Surgical History  Procedure Laterality Date  . Cholecystectomy    . Hernia repair    . Eye surgery     HPI:  82 y.o. male with medical history significant of hypertension, hyperlipidemia, GERD, stroke, diastolic congestive heart failure, who presents with right hip pain after fall s/p right hip repair. Pt will d/c to SNF today, but has a history of dyshpagia requiring nectar and honey thick liquids in the past. MBS 1/17 shows silent penetration and residuals. Honey thick recommended, but pt refused.    Assessment / Plan / Recommendation Clinical Impression  Pt demonstrates ongoing signs of aspiration with large sips of thin liquids (cough) and probable silent penetration with small sips of thin given wet vocal quality and history of decreased sensation. Given history of dysphagia and new hip fx with impending d/c to SNF, recommend MBS to provide objective information for next SLP at facility. Pt is able to follow instructions and could benefit from compensatory strategies if helpful, to reduce risk. Pt may initiate a dys 3 (mech soft) diet and nectar thick liquids in the meantime.     Aspiration Risk  Severe aspiration risk    Diet Recommendation Dysphagia 3 (Mech soft);Nectar-thick liquid   Liquid Administration via: Cup Medication Administration: Whole meds with puree Supervision: Patient able to  self feed Compensations: Slow rate;Small sips/bites Postural Changes: Seated upright at 90 degrees    Other  Recommendations Oral Care Recommendations: Oral care BID Other Recommendations: Order thickener from pharmacy   Follow up Recommendations  Skilled Nursing facility    Frequency and Duration            Prognosis        Swallow Study   General HPI: 82 y.o. male with medical history significant of hypertension, hyperlipidemia, GERD, stroke, diastolic congestive heart failure, who presents with right hip pain after fall s/p right hip repair. Pt will d/c to SNF today, but has a history of dyshpagia requiring nectar and honey thick liquids in the past. MBS 1/17 shows silent penetration and residuals. Honey thick recommended, but pt refused.  Type of Study: Bedside Swallow Evaluation Previous Swallow Assessment: see HPI Diet Prior to this Study: NPO Temperature Spikes Noted: No Respiratory Status: Room air History of Recent Intubation: No Behavior/Cognition: Alert;Cooperative;Pleasant mood Oral Cavity Assessment: Within Functional Limits Oral Care Completed by SLP: No Oral Cavity - Dentition: Poor condition;Missing dentition Vision: Functional for self-feeding Self-Feeding Abilities: Able to feed self Patient Positioning: Upright in bed Baseline Vocal Quality: Normal Volitional Cough: Strong Volitional Swallow: Able to elicit    Oral/Motor/Sensory Function Overall Oral Motor/Sensory Function: Within functional limits   Ice Chips Ice chips: Not tested   Thin Liquid Thin Liquid: Impaired Presentation: Cup;Self Fed;Straw Pharyngeal  Phase Impairments: Wet Vocal Quality;Suspected delayed Swallow;Cough - Immediate    Nectar Thick Nectar Thick Liquid: Not tested  Honey Thick Honey Thick Liquid: Not tested   Puree Puree: Within functional limits   Solid   GO   Solid: Within functional limits       [facility] Medical CenterBonnie Gladie Gravette, MA CCC-SLP [phone]  [place] Espina, Riley NearingBonnie  Caroline 04/07/2016,10:22 AM

## 2016-04-07 NOTE — Care Management Important Message (Signed)
Important Message  Patient Details  Name: Eric Berger MRN: [id number] Date of Birth: 1926-05-30   Medicare Important Message Given:  Yes    Bernadette HoitShoffner, Ahkeem Goede Coleman 04/07/2016, 3:04 PM

## 2016-04-07 NOTE — Progress Notes (Addendum)
[facility]  PROGRESS NOTE  Eric Berger ZOX:[id number] DOB: 02-Feb-1926 DOA: 04/04/2016 PCP: Leo Grosser, MD    Brief HPI:  82 y.o. male with medical history significant of hypertension, hyperlipidemia, GERD, stroke, diastolic congestive heart failure, who presents with right hip pain after fall.  Pt states that he fell when he went into the tool shop, was started by a lizard, lost his balance, fell to his rt side and injured his right hip. He developed moderate right hip pain, which is constant, nonradiating. Patient does not have numbness in his right leg. Patient denies head injury or neck injury. No headache or neck pain. Patient has nausea, and vomited when he had x-ray chest. Patient denies chest pain, shortness breath, cough, abdominal pain, diarrhea, symptoms of a UTI or unilateral weakness.  Principal Problem:   Closed right hip fracture (HCC) Active Problems:   G E R D   HTN (hypertension)   Stroke (HCC)   High cholesterol   Chronic diastolic CHF (congestive heart failure) (HCC)   Nausea & vomiting   Assessment/Plan:  Closed right hip fracture (HCC): As evidenced by x-ray. S/p right hip hemiarthroplasty. Orthopedics following.  Leukocytosis:  wbs 17,000. Likely due to stress-induced demargination. Will check UA and CXR. Follow cbc in am.  Nausea & vomiting:  resolved, lipase is 25. -prn Zofran for nausea  HTN: -continue home HCTZ, Cozaar -IV hydralazine when necessary  HLD: Last LDL was 70 on 03/28/16 -Continue home medications: Zocor  Hx of stroke: no acute new issues. -continue ASA and zocor  Chronic diastolic CHF (congestive heart failure) (HCC): 2-D echo 09/29/15 showed EF 55%. No leg edema or JVD.  CHF is compensated. -Continue HCTZ and aspirin   DVT prophylaxis: Lovenox Code Status: Full code Family Communication: no family at bedside Disposition Plan: SNF   Consultants:  Orthopedics   Procedures:  Right hip  hemiarthroplasty  Antibiotics:  None   Subjective: Patient seen and examined, s/p right hip hemiarthroplasty. Denies pain at this time.Patient started on regular diet, passed swallow eval  Objective: Filed Vitals:   04/06/16 2044 04/07/16 0614 04/07/16 1012 04/07/16 1300  BP: 117/56 111/56 120/63 117/42  Pulse: 89 81 74 85  Temp: 98.3 F (36.8 C) 98.3 F (36.8 C) 97.9 F (36.6 C) 97.9 F (36.6 C)  TempSrc: Oral Oral Oral Oral  Resp: 16 16 18 18   Height:      Weight:      SpO2: 93% 95% 96% 97%    Intake/Output Summary (Last 24 hours) at 04/07/16 1506 Last data filed at 04/07/16 0900  Gross per 24 hour  Intake      0 ml  Output    950 ml  Net   -950 ml   Filed Weights   04/04/16 1614 04/04/16 1932  Weight: 68.493 kg (151 lb) 69.219 kg (152 lb 9.6 oz)    Examination:  General exam: Appears calm and comfortable  Respiratory system: Clear to auscultation. Respiratory effort normal. Cardiovascular system: S1 & S2 heard, RRR. No JVD, murmurs, rubs, gallops or clicks. No pedal edema. Gastrointestinal system: Abdomen is nondistended, soft and nontender. No organomegaly or masses felt. Normal bowel sounds heard. Central nervous system: Alert and oriented. No focal neurological deficits. Extremities: Symmetric 5 x 5 power. Skin: No rashes, lesions or ulcers Psychiatry: Judgement and insight appear normal. Mood & affect appropriate.    Data Reviewed: I have personally reviewed following labs and imaging studies Basic Metabolic Panel:  Recent Labs Lab 04/04/16 1717  04/05/16 1218 04/06/16 0300  NA 141  --  135  K 4.5  --  3.9  CL 104  --  102  CO2 27  --  24  GLUCOSE 133*  --  122*  BUN 38*  --  26*  CREATININE 1.22 1.13 1.02  CALCIUM 9.9  --  8.7*    Recent Labs Lab 04/04/16 2039  LIPASE 25   No results for input(s): AMMONIA in the last 168 hours. CBC:  Recent Labs Lab 04/04/16 1717 04/05/16 1218 04/06/16 0300 04/07/16 0331  WBC 14.3* 14.8* 11.0*  17.1*  NEUTROABS 11.9*  --   --   --   HGB 14.4 12.0* 11.9* 11.4*  HCT 41.6 35.1* 34.9* 33.0*  MCV 90.8 91.6 90.4 90.2  PLT 255 202 189 200   Cardiac Enzymes: No results for input(s): CKTOTAL, CKMB, CKMBINDEX, TROPONINI in the last 168 hours. BNP (last 3 results)  Recent Labs  09/30/15 1619 04/05/16 0612  BNP 803.4* 171.4*      Recent Results (from the past 240 hour(s))  MRSA PCR Screening     Status: None   Collection Time: 04/05/16  4:16 AM  Result Value Ref Range Status   MRSA by PCR NEGATIVE NEGATIVE Final    Comment:        The GeneXpert MRSA Assay (FDA approved for NASAL specimens only), is one component of a comprehensive MRSA colonization surveillance program. It is not intended to diagnose MRSA infection nor to guide or monitor treatment for MRSA infections.      Studies: Dg Swallowing Func-speech Pathology  04/07/2016  Objective Swallowing Evaluation: Type of Study: MBS-Modified Barium Swallow Study Patient Details Name: Eric Berger MRN: [id number] Date of Birth: Jan 12, 1926 Today's Date: 04/07/2016 Time: SLP Start Time (ACUTE ONLY): 1130-SLP Stop Time (ACUTE ONLY): 1200 SLP Time Calculation (min) (ACUTE ONLY): 30 min Past Medical History: Past Medical History Diagnosis Date . Stroke (HCC)  . Renal disorder  . Hernia  . High cholesterol  . Hearing loss  . GERD (gastroesophageal reflux disease)  . Cataracts, bilateral  . Hypertension  . Chronic diastolic CHF (congestive heart failure) [facility](HCC)  Past Surgical History: Past Surgical History Procedure Laterality Date . Cholecystectomy   . Hernia repair   . Eye surgery   . Hip arthroplasty Right 04/05/2016   Procedure: RIGHT HIP HEMIARTHROPLASTY;  Surgeon: Sheral Apleyimothy D Murphy, MD;  Location: [facility] OR;  Service: Orthopedics;  Laterality: Right; HPI: 82 y.o. male with medical history significant of hypertension, hyperlipidemia, GERD, stroke, diastolic congestive heart failure, who presents with right hip pain after fall s/p right  hip repair. Pt will d/c to SNF today, but has a history of dyshpagia requiring nectar and honey thick liquids in the past. MBS 1/17 shows silent penetration and residuals. Honey thick recommended, but pt refused.  No Data Recorded Assessment / Plan / Recommendation CHL IP CLINICAL IMPRESSIONS 04/07/2016 Therapy Diagnosis Moderate pharyngeal phase dysphagia Clinical Impression Pt demonstrates a mild to moderate oropharygneal dysphagia due to delayed swallow initation and trace silent penetration before the swallow with thin liquids. If pt takes too large a sip, trace silent or significant sensed aspiration can occur. There are also mild vallecular and pyriform residuals post swallow from mild base of tongue residue that spills to pharynx post swallow. The pt is recommended to swallow twice and clear throat intermittently to clear possible trace penetrates. He was able to repeat this strategy with fading cues. Given that pt is alert and cognitively intact with  good rehab potential, recommend a regualr diet and thin liquids, as he was consuming PTA with f/u SLP therapy to reinforce precautions. Pt in agreement.  Impact on safety and function Mild aspiration risk   CHL IP TREATMENT RECOMMENDATION 04/07/2016 Treatment Recommendations Therapy as outlined in treatment plan below   Prognosis 04/07/2016 Prognosis for Safe Diet Advancement Good Barriers to Reach Goals -- Barriers/Prognosis Comment -- CHL IP DIET RECOMMENDATION 04/07/2016 SLP Diet Recommendations Regular solids;Thin liquid Liquid Administration via Cup;No straw Medication Administration Whole meds with puree Compensations Slow rate;Small sips/bites Postural Changes Seated upright at 90 degrees   CHL IP OTHER RECOMMENDATIONS 04/07/2016 Recommended Consults -- Oral Care Recommendations Oral care BID Other Recommendations --   CHL IP FOLLOW UP RECOMMENDATIONS 04/07/2016 Follow up Recommendations [facility]illed Nursing facility   [facility] FREQUENCY AND DURATION 04/07/2016 Speech  Therapy Frequency (ACUTE ONLY) min 2x/week Treatment Duration 2 weeks      CHL IP ORAL PHASE 04/07/2016 Oral Phase WFL Oral - Pudding Teaspoon -- Oral - Pudding Cup -- Oral - Honey Teaspoon -- Oral - Honey Cup -- Oral - Nectar Teaspoon -- Oral - Nectar Cup -- Oral - Nectar Straw -- Oral - Thin Teaspoon -- Oral - Thin Cup -- Oral - Thin Straw -- Oral - Puree -- Oral - Mech Soft -- Oral - Regular -- Oral - Multi-Consistency -- Oral - Pill -- Oral Phase - Comment --  CHL IP PHARYNGEAL PHASE 04/07/2016 Pharyngeal Phase Impaired Pharyngeal- Pudding Teaspoon -- Pharyngeal -- Pharyngeal- Pudding Cup -- Pharyngeal -- Pharyngeal- Honey Teaspoon -- Pharyngeal -- Pharyngeal- Honey Cup -- Pharyngeal -- Pharyngeal- Nectar Teaspoon -- Pharyngeal -- Pharyngeal- Nectar Cup Delayed swallow initiation-vallecula;Pharyngeal residue - valleculae;Pharyngeal residue - pyriform Pharyngeal -- Pharyngeal- Nectar Straw -- Pharyngeal -- Pharyngeal- Thin Teaspoon -- Pharyngeal -- Pharyngeal- Thin Cup Delayed swallow initiation-pyriform sinuses;Pharyngeal residue - valleculae;Pharyngeal residue - pyriform;Penetration/Aspiration before swallow;Penetration/Aspiration during swallow Pharyngeal Material enters airway, CONTACTS cords and not ejected out;Material does not enter airway Pharyngeal- Thin Straw Delayed swallow initiation-pyriform sinuses;Penetration/Aspiration before swallow;Penetration/Aspiration during swallow;Pharyngeal residue - valleculae;Pharyngeal residue - pyriform;Trace aspiration Pharyngeal Material enters airway, passes BELOW cords without attempt by patient to eject out (silent aspiration) Pharyngeal- Puree Delayed swallow initiation-vallecula;Pharyngeal residue - valleculae Pharyngeal -- Pharyngeal- Mechanical Soft -- Pharyngeal -- Pharyngeal- Regular Delayed swallow initiation-vallecula;Pharyngeal residue - valleculae Pharyngeal -- Pharyngeal- Multi-consistency -- Pharyngeal -- Pharyngeal- Pill -- Pharyngeal -- Pharyngeal  Comment --  CHL IP CERVICAL ESOPHAGEAL PHASE 04/07/2016 Cervical Esophageal Phase (No Data) Pudding Teaspoon -- Pudding Cup -- Honey Teaspoon -- Honey Cup -- Nectar Teaspoon -- Nectar Cup -- Nectar Straw -- Thin Teaspoon -- Thin Cup -- Thin Straw -- Puree -- Mechanical Soft -- Regular -- Multi-consistency -- Pill -- Cervical Esophageal Comment -- No flowsheet data found. Harlon Ditty, [place] CCC-SLP [phone] Claudine Mouton 04/07/2016, 2:31 PM               Scheduled Meds: . aspirin  325 mg Oral Daily  . cholecalciferol  1,000 Units Oral Daily  . docusate sodium  100 mg Oral BID  . enoxaparin (LOVENOX) injection  40 mg Subcutaneous Q24H  . hydrochlorothiazide  25 mg Oral Daily  . losartan  50 mg Oral Daily  . magnesium oxide  200 mg Oral Daily  . multivitamin with minerals  1 tablet Oral Daily  . simvastatin  40 mg Oral q1800   Continuous Infusions: . dextrose 5 % and 0.45% NaCl      Time spent: 25 min  [facility]  [facility] Pager [phone]. If 7PM-7AM, please contact night-coverage at [email or web address], Office  [phone]  password [email or web address] 04/07/2016, 3:06 PM  LOS: 2 days

## 2016-04-08 DIAGNOSIS — Z5181 Encounter for therapeutic drug level monitoring: Secondary | ICD-10-CM | POA: Diagnosis not present

## 2016-04-08 DIAGNOSIS — D500 Iron deficiency anemia secondary to blood loss (chronic): Secondary | ICD-10-CM | POA: Diagnosis not present

## 2016-04-08 DIAGNOSIS — S72001D Fracture of unspecified part of neck of right femur, subsequent encounter for closed fracture with routine healing: Secondary | ICD-10-CM | POA: Diagnosis not present

## 2016-04-08 DIAGNOSIS — M6281 Muscle weakness (generalized): Secondary | ICD-10-CM | POA: Diagnosis not present

## 2016-04-08 DIAGNOSIS — R112 Nausea with vomiting, unspecified: Secondary | ICD-10-CM | POA: Diagnosis not present

## 2016-04-08 DIAGNOSIS — I639 Cerebral infarction, unspecified: Secondary | ICD-10-CM | POA: Diagnosis not present

## 2016-04-08 DIAGNOSIS — K219 Gastro-esophageal reflux disease without esophagitis: Secondary | ICD-10-CM | POA: Diagnosis not present

## 2016-04-08 DIAGNOSIS — S72001A Fracture of unspecified part of neck of right femur, initial encounter for closed fracture: Secondary | ICD-10-CM | POA: Diagnosis not present

## 2016-04-08 DIAGNOSIS — N182 Chronic kidney disease, stage 2 (mild): Secondary | ICD-10-CM | POA: Diagnosis not present

## 2016-04-08 DIAGNOSIS — S7290XA Unspecified fracture of unspecified femur, initial encounter for closed fracture: Secondary | ICD-10-CM | POA: Diagnosis not present

## 2016-04-08 DIAGNOSIS — R1312 Dysphagia, oropharyngeal phase: Secondary | ICD-10-CM | POA: Diagnosis not present

## 2016-04-08 DIAGNOSIS — I5032 Chronic diastolic (congestive) heart failure: Secondary | ICD-10-CM | POA: Diagnosis not present

## 2016-04-08 DIAGNOSIS — I10 Essential (primary) hypertension: Secondary | ICD-10-CM | POA: Diagnosis not present

## 2016-04-08 DIAGNOSIS — E7800 Pure hypercholesterolemia, unspecified: Secondary | ICD-10-CM | POA: Diagnosis not present

## 2016-04-08 DIAGNOSIS — E785 Hyperlipidemia, unspecified: Secondary | ICD-10-CM | POA: Diagnosis not present

## 2016-04-08 DIAGNOSIS — S7290XD Unspecified fracture of unspecified femur, subsequent encounter for closed fracture with routine healing: Secondary | ICD-10-CM | POA: Diagnosis not present

## 2016-04-08 LAB — CBC
HEMATOCRIT: 35.1 % — AB (ref 39.0–52.0)
HEMOGLOBIN: 11.9 g/dL — AB (ref 13.0–17.0)
MCH: 30.8 pg (ref 26.0–34.0)
MCHC: 33.9 g/dL (ref 30.0–36.0)
MCV: 90.9 fL (ref 78.0–100.0)
Platelets: 251 10*3/uL (ref 150–400)
RBC: 3.86 MIL/uL — ABNORMAL LOW (ref 4.22–5.81)
RDW: 12.7 % (ref 11.5–15.5)
WBC: 13.6 10*3/uL — AB (ref 4.0–10.5)

## 2016-04-08 LAB — BASIC METABOLIC PANEL
Anion gap: 8 (ref 5–15)
BUN: 35 mg/dL — ABNORMAL HIGH (ref 6–20)
CO2: 28 mmol/L (ref 22–32)
CREATININE: 1.11 mg/dL (ref 0.61–1.24)
Calcium: 8.7 mg/dL — ABNORMAL LOW (ref 8.9–10.3)
Chloride: 100 mmol/L — ABNORMAL LOW (ref 101–111)
GFR calc Af Amer: >60 mL/min (ref >60)
GFR calc non Af Amer: 58 mL/min — ABNORMAL LOW (ref >60)
GLUCOSE: 113 mg/dL — AB (ref 65–99)
Potassium: 3.8 mmol/L (ref 3.5–5.1)
Sodium: 136 mmol/L (ref 135–145)

## 2016-04-08 NOTE — Progress Notes (Signed)
Speech Language Pathology Treatment: Dysphagia  Patient Details Name: Eric Berger MRN: [id number] DOB: 07/08/1926 Today's Date: 04/08/2016 Time: [id number] SLP Time Calculation (min) (ACUTE ONLY): 9 min  Assessment / Plan / Recommendation Clinical Impression  Pt verbalized recall of strategies as soon as he saw SLP, but required moderate cues to complete throat clear and second swallow over 8 oz of intake.  Repositioned sign as a visual reminder to pt.  Pt was very consistent in taking small sips as previously instructed. Will need f/u with SLP at SNF to reinforce precautions    HPI HPI: 82 y.o. male with medical history significant of hypertension, hyperlipidemia, GERD, stroke, diastolic congestive heart failure, who presents with right hip pain after fall s/p right hip repair. Pt will d/c to SNF today, but has a history of dyshpagia requiring nectar and honey thick liquids in the past. MBS 1/17 shows silent penetration and residuals. Honey thick recommended, but pt refused.       SLP Plan  Continue with current plan of care     Recommendations  Diet recommendations: Regular;Thin liquid Liquids provided via: Cup;No straw Medication Administration: Whole meds with puree Supervision: Patient able to self feed Compensations: Slow rate;Small sips/bites;Clear throat intermittently;Multiple dry swallows after each bite/sip Postural Changes and/or Swallow Maneuvers: Seated upright 90 degrees             Follow up Recommendations: Skilled Nursing facility Plan: Continue with current plan of care     GO               Harlon DittyBonnie Kinzlee Selvy, MA CCC-SLP [phone]  Claudine MoutonDeBlois, Kastiel Simonian Caroline 04/08/2016, 2:08 PM

## 2016-04-08 NOTE — Clinical Social Work Placement (Signed)
   CLINICAL SOCIAL WORK PLACEMENT  NOTE 04/08/16 - DISCHARGED TO [facility]  Date:  04/08/2016  Patient Details  Name: Eric Berger MRN: [id number] Date of Birth: 29-Sep-1925  Clinical Social Work is seeking post-discharge placement for this patient at the Skilled  Nursing Facility level of care (*CSW will initial, date and re-position this form in  chart as items are completed):  Yes   Patient/family provided with [facility] Clinical Social Work Department's list of facilities offering this level of care within the geographic area requested by the patient (or if unable, by the patient's family).  Yes   Patient/family informed of their freedom to choose among providers that offer the needed level of care, that participate in Medicare, Medicaid or managed care program needed by the patient, have an available bed and are willing to accept the patient.  Yes   Patient/family informed of [facility] ownership interest [facility] and [facility], as well as of the fact that they are under no obligation to receive care at these facilities.  PASRR submitted to EDS on       PASRR number received on       Existing PASRR number confirmed on 04/06/16     FL2 transmitted to all facilities in geographic area requested by pt/family on 04/06/16     FL2 transmitted to all facilities within larger geographic area on       Patient informed that his/her managed care company has contracts with or will negotiate with certain facilities, including the following:        Yes   Patient/family informed of bed offers received.  [facility]     Physician recommends and patient chooses bed at      [facility]   Patient to be transferred to facility by [facility]     Patient family notified on 04/07/16 and 04/08/16 of transfer.   Name of family member notified:  [name] and Cordelia PenSherry. On  7/18 CSW called wife, Courtney HeysLille and advised her of discharge and ambulance transport.     PHYSICIAN Please prepare prescriptions     Additional Comment:    _______________________________________________ Cristobal Goldmannrawford, Abella Shugart Bradley, LCSW 04/08/2016, 4:16 PM

## 2016-04-08 NOTE — Progress Notes (Signed)
Report called to Cechal at [facility].  Pt aware and questions answered.  Pt's wife [name] made aware via phone call and questions were answered satisfactorily. All belongings packed up and awaiting transport to facility at this time.

## 2016-04-08 NOTE — Progress Notes (Signed)
   Assessment: 3 Days Post-Op  S/P Procedure(s) (LRB): RIGHT HIP HEMIARTHROPLASTY (Right)  by Dr. Jewel Baizeimothy D. Eulah PontMurphy on 04/05/16  Principal Problem:   Closed right hip fracture (HCC) Active Problems:   G E R D   HTN (hypertension)   Stroke (HCC)   High cholesterol   Chronic diastolic CHF (congestive heart failure) (HCC)   Nausea & vomiting  ADDITIONAL DIAGNOSIS:  Expected Acute Blood Loss Anemia, likely w/ dilutional component.  H/H stable.  WBC down.  No evidence of infection.  Continued improvement from an orthopedic perspective.    Plan: Continue therapy.  SNF.  Weight Bearing: Weight Bearing as Tolerated (WBAT) Right leg.  Posterior Hip Precautions. Dressings: prn.  VTE prophylaxis: Lovenox, SCDs, ambulation.  Takes ASA chronically. Dispo: Skilled Nursing Facility/Rehab  Subjective: Pain mild & controlled.  Tolerating diet.  No fever, chills.  Urinating. No CP, SOB.  +flatus, no BM.  OOB with few steps in room.  Reviewed posterior hip precautions and deep breathing again.  Objective:   VITALS:   Filed Vitals:   04/07/16 1012 04/07/16 1300 04/07/16 2100 04/08/16 0500  BP: 120/63 117/42 128/53 121/59  Pulse: 74 85 88 74  Temp: 97.9 F (36.6 C) 97.9 F (36.6 C) 98.4 F (36.9 C) 98.2 F (36.8 C)  TempSrc: Oral Oral Oral Oral  Resp: 18 18 18 18   Height:      Weight:      SpO2: 96% 97% 92% 63%    Physical Exam General: Conversant.  NAD.  Supine in bed. MSK: Neurovascular intact Sensation intact distally Intact pulses distally Dorsiflexion/Plantar flexion intact Incision: dressing C/D/I and no drainage   Eric Berger 04/08/2016, 7:20 AM

## 2016-04-08 NOTE — Discharge Summary (Signed)
Physician Discharge Summary  Eric Berger ZOX:[id number] DOB: 1926-05-24 DOA: 04/04/2016  PCP: Leo Grosser, MD  Admit date: 04/04/2016 Discharge date: 04/08/2016  Time spent: *25* minutes  Recommendations for Outpatient Follow-up:  1. *Follow up Ortho in 2 weeks   Discharge Diagnoses:  Principal Problem:   Closed right hip fracture (HCC) Active Problems:   G E R D   HTN (hypertension)   Stroke (HCC)   High cholesterol   Chronic diastolic CHF (congestive heart failure) (HCC)   Nausea & vomiting   Discharge Condition: Stable  Diet recommendation: Regular diet  Filed Weights   04/04/16 1614 04/04/16 1932  Weight: 68.493 kg (151 lb) 69.219 kg (152 lb 9.6 oz)    History of present illness:  82 y.o. male with medical history significant of hypertension, hyperlipidemia, GERD, stroke, diastolic congestive heart failure, who presents with right hip pain after fall. Pt states that he fell when he went into the tool shop, was started by a lizard, lost his balance, fell to his rt side and injured his right hip. He developed moderate right hip pain, which is constant, nonradiating. Patient does not have numbness in his right leg. Patient denies head injury or neck injury. No headache or neck pain. Patient has nausea, and vomited when he had x-ray chest. Patient denies chest pain, shortness breath, cough, abdominal pain, diarrhea, symptoms of a UTI or unilateral weakness.  Hospital Course:   Closed right hip fracture [facility]):  As evidenced by x-ray. S/p right hip hemiarthroplasty.  Patient to go to rehab. Orthopedics to follow up in 2 weeks DVT prophylaxis Lovenox as per Ortho recommendation  Leukocytosis:resolved.  Today 13,000 Likely due to stress-induced demargination.   UA and CXR are negative.   Nausea & vomiting:  resolved, lipase is 25. -prn Zofran for nausea  HTN: -continue home HCTZ, Cozaar   HLD: Last LDL was 70 on 03/28/16 -Continue home medications:  Zocor  Hx of stroke: no acute new issues. -continue ASA and zocor  Chronic diastolic CHF (congestive heart failure) (HCC):  2-D echo 09/29/15 showed EF 55%. No leg edema or JVD.  CHF is compensated. -Continue HCTZ and aspirin  Procedures:  Left hip hemiarthroplasty  Consultations:  Orthopedics  Discharge Exam: Filed Vitals:   04/07/16 2100 04/08/16 0500  BP: 128/53 121/59  Pulse: 88 74  Temp: 98.4 F (36.9 C) 98.2 F (36.8 C)  Resp: 18 18    General: Appears in no acute distress Cardiovascular: S1S2 RRR Respiratory: Clear bilaterally  Discharge Instructions   Discharge Instructions    Diet - low sodium heart healthy    Complete by:  As directed      Increase activity slowly    Complete by:  As directed           Current Discharge Medication List    START taking these medications   Details  enoxaparin (LOVENOX) 40 MG/0.4ML injection Inject 0.4 mLs (40 mg total) into the skin daily. Qty: 30 Syringe, Refills: 0    HYDROcodone-acetaminophen (NORCO) 5-325 MG tablet Take 1 tablet by mouth every 6 (six) hours as needed for moderate pain. Qty: 30 tablet, Refills: 0      CONTINUE these medications which have NOT CHANGED   Details  aspirin 325 MG tablet Take 325 mg by mouth daily.    Cholecalciferol (VITAMIN D-3) 1000 units CAPS Take 1 capsule by mouth daily.    GARLIC PO Take 1 tablet by mouth daily.    glucosamine-chondroitin 500-400 MG tablet  Take 1 tablet by mouth 2 (two) times daily.     hydrochlorothiazide (HYDRODIURIL) 25 MG tablet Take 1 tablet (25 mg total) by mouth daily. Qty: 90 tablet, Refills: 3   Associated Diagnoses: Benign essential HTN    losartan (COZAAR) 50 MG tablet TAKE 1 TABLET (50 MG TOTAL) BY MOUTH DAILY. Qty: 30 tablet, Refills: 5    Magnesium 250 MG TABS Take 1 tablet by mouth daily.    Multiple Vitamin (MULTIVITAMIN) tablet Take 1 tablet by mouth daily.    Saw Palmetto 450 MG CAPS Take 1 capsule by mouth daily.     simvastatin (ZOCOR) 40 MG tablet TAKE ONE TABLET BY MOUTH ONCE DAILY AT 6PM Qty: 90 tablet, Refills: 0    albuterol-ipratropium (COMBIVENT) 18-103 MCG/ACT inhaler Inhale 2 puffs into the lungs 4 (four) times daily. Qty: 1 Inhaler, Refills: 0    Maltodextrin-Xanthan Gum (RESOURCE THICKENUP CLEAR) POWD Take 1 g by mouth as needed. Qty: 125 g, Refills: 0       No Known Allergies Follow-up Information    Follow up with MURPHY, TIMOTHY D, MD In 2 weeks.   Specialty:  Orthopedic Surgery   Contact information:   [address] [place] [address] [phone]       Follow up with [facility] .   Specialty:  Skilled Nursing Facility   Contact information:   [address] [place] [address] [phone]       The results of significant diagnostics from this hospitalization (including imaging, microbiology, ancillary and laboratory) are listed below for reference.    Significant Diagnostic Studies: Dg Chest 2 View  04/07/2016  CLINICAL DATA:  Leukocytosis, hip surgery on July 15th. History of hypertension and CHF. EXAM: CHEST  2 VIEW COMPARISON:  Chest x-ray dated 10/03/2015. FINDINGS: Heart size is upper normal, stable. Overall cardiomediastinal silhouette is stable in size and configuration. Atherosclerotic calcifications again noted at the aortic arch. Coarse interstitial markings are again noted throughout both lungs, slightly more prominent on today's exam due to lower lung volumes. No new airspace opacity to suggest a developing pneumonia. No pleural effusion or pneumothorax seen. Mild degenerative spurring is seen throughout the slightly kyphotic thoracic spine. No acute or suspicious osseous findings. IMPRESSION: 1. No acute findings. No evidence of pneumonia. No pleural effusion. 2. Suspect some degree of chronic interstitial lung disease. 3. Aortic atherosclerosis. Electronically Signed   By: Bary Richard M.D.   On:  04/07/2016 18:37   Dg Pelvis Portable  04/05/2016  CLINICAL DATA:  Status post right hip replacement. EXAM: PORTABLE PELVIS 1-2 VIEWS COMPARISON:  CT scan 04/04/2016. FINDINGS: Patient is status post right hip hemiarthroplasty. No evidence for hardware complications. Gas in the overlying soft tissues compatible with the immediate postoperative state. IMPRESSION: Status post right hip hemiarthroplasty without complicating features. Electronically Signed   By: Kennith Center M.D.   On: 04/05/2016 15:07   Ct Hip Right Wo Contrast  04/04/2016  CLINICAL DATA:  82 year old male with fall and right hip pain. EXAM: CT OF THE RIGHT HIP WITHOUT CONTRAST TECHNIQUE: Multidetector CT imaging of the right hip was performed according to the standard protocol. Multiplanar CT image reconstructions were also generated. COMPARISON:  First radiograph dated 04/04/2016 and CT of the abdomen pelvis dated 09/24/2015 FINDINGS: There is advanced osteopenia which limits evaluation for fracture. There is a transverse fracture of the right femoral neck. There is foreshortening of the femoral neck with mild impaction. There is sharp edge  of the fracture a along the medial cortex of the left femoral neck. This fracture is new compared the CT dated 09/24/2015 and likely represents an acute fracture. There is no dislocation. There is no joint effusion. The soft tissues appear unremarkable. IMPRESSION: Nondisplaced mildly impacted transverse fracture of the right femoral neck. This fracture is new from prior study 01/02/2016 and likely acute. No dislocation. Electronically Signed   By: Elgie Collard M.D.   On: 04/04/2016 18:27   Dg Swallowing Func-speech Pathology  04/07/2016  Objective Swallowing Evaluation: Type of Study: MBS-Modified Barium Swallow Study Patient Details Name: AUDWIN SEMPER MRN: [id number] Date of Birth: 1926-09-21 Today's Date: 04/07/2016 Time: SLP Start Time (ACUTE ONLY): 1130-SLP Stop Time (ACUTE ONLY): 1200 SLP  Time Calculation (min) (ACUTE ONLY): 30 min Past Medical History: Past Medical History Diagnosis Date . Stroke (HCC)  . Renal disorder  . Hernia  . High cholesterol  . Hearing loss  . GERD (gastroesophageal reflux disease)  . Cataracts, bilateral  . Hypertension  . Chronic diastolic CHF (congestive heart failure) [facility])  Past Surgical History: Past Surgical History Procedure Laterality Date . Cholecystectomy   . Hernia repair   . Eye surgery   . Hip arthroplasty Right 04/05/2016   Procedure: RIGHT HIP HEMIARTHROPLASTY;  Surgeon: Sheral Apley, MD;  Location: [facility] OR;  Service: Orthopedics;  Laterality: Right; HPI: 82 y.o. male with medical history significant of hypertension, hyperlipidemia, GERD, stroke, diastolic congestive heart failure, who presents with right hip pain after fall s/p right hip repair. Pt will d/c to SNF today, but has a history of dyshpagia requiring nectar and honey thick liquids in the past. MBS 1/17 shows silent penetration and residuals. Honey thick recommended, but pt refused.  No Data Recorded Assessment / Plan / Recommendation CHL IP CLINICAL IMPRESSIONS 04/07/2016 Therapy Diagnosis Moderate pharyngeal phase dysphagia Clinical Impression Pt demonstrates a mild to moderate oropharygneal dysphagia due to delayed swallow initation and trace silent penetration before the swallow with thin liquids. If pt takes too large a sip, trace silent or significant sensed aspiration can occur. There are also mild vallecular and pyriform residuals post swallow from mild base of tongue residue that spills to pharynx post swallow. The pt is recommended to swallow twice and clear throat intermittently to clear possible trace penetrates. He was able to repeat this strategy with fading cues. Given that pt is alert and cognitively intact with good rehab potential, recommend a regualr diet and thin liquids, as he was consuming PTA with f/u SLP therapy to reinforce precautions. Pt in agreement.  Impact on safety  and function Mild aspiration risk   CHL IP TREATMENT RECOMMENDATION 04/07/2016 Treatment Recommendations Therapy as outlined in treatment plan below   Prognosis 04/07/2016 Prognosis for Safe Diet Advancement Good Barriers to Reach Goals -- Barriers/Prognosis Comment -- CHL IP DIET RECOMMENDATION 04/07/2016 SLP Diet Recommendations Regular solids;Thin liquid Liquid Administration via Cup;No straw Medication Administration Whole meds with puree Compensations Slow rate;Small sips/bites Postural Changes Seated upright at 90 degrees   CHL IP OTHER RECOMMENDATIONS 04/07/2016 Recommended Consults -- Oral Care Recommendations Oral care BID Other Recommendations --   CHL IP FOLLOW UP RECOMMENDATIONS 04/07/2016 Follow up Recommendations Skilled Nursing facility   [facility] IP FREQUENCY AND DURATION 04/07/2016 Speech Therapy Frequency (ACUTE ONLY) min 2x/week Treatment Duration 2 weeks      CHL IP ORAL PHASE 04/07/2016 Oral Phase WFL Oral - Pudding Teaspoon -- Oral - Pudding Cup -- Oral - Honey Teaspoon -- Oral - Honey Cup --  Oral - Nectar Teaspoon -- Oral - Nectar Cup -- Oral - Nectar Straw -- Oral - Thin Teaspoon -- Oral - Thin Cup -- Oral - Thin Straw -- Oral - Puree -- Oral - Mech Soft -- Oral - Regular -- Oral - Multi-Consistency -- Oral - Pill -- Oral Phase - Comment --  CHL IP PHARYNGEAL PHASE 04/07/2016 Pharyngeal Phase Impaired Pharyngeal- Pudding Teaspoon -- Pharyngeal -- Pharyngeal- Pudding Cup -- Pharyngeal -- Pharyngeal- Honey Teaspoon -- Pharyngeal -- Pharyngeal- Honey Cup -- Pharyngeal -- Pharyngeal- Nectar Teaspoon -- Pharyngeal -- Pharyngeal- Nectar Cup Delayed swallow initiation-vallecula;Pharyngeal residue - valleculae;Pharyngeal residue - pyriform Pharyngeal -- Pharyngeal- Nectar Straw -- Pharyngeal -- Pharyngeal- Thin Teaspoon -- Pharyngeal -- Pharyngeal- Thin Cup Delayed swallow initiation-pyriform sinuses;Pharyngeal residue - valleculae;Pharyngeal residue - pyriform;Penetration/Aspiration before  swallow;Penetration/Aspiration during swallow Pharyngeal Material enters airway, CONTACTS cords and not ejected out;Material does not enter airway Pharyngeal- Thin Straw Delayed swallow initiation-pyriform sinuses;Penetration/Aspiration before swallow;Penetration/Aspiration during swallow;Pharyngeal residue - valleculae;Pharyngeal residue - pyriform;Trace aspiration Pharyngeal Material enters airway, passes BELOW cords without attempt by patient to eject out (silent aspiration) Pharyngeal- Puree Delayed swallow initiation-vallecula;Pharyngeal residue - valleculae Pharyngeal -- Pharyngeal- Mechanical Soft -- Pharyngeal -- Pharyngeal- Regular Delayed swallow initiation-vallecula;Pharyngeal residue - valleculae Pharyngeal -- Pharyngeal- Multi-consistency -- Pharyngeal -- Pharyngeal- Pill -- Pharyngeal -- Pharyngeal Comment --  CHL IP CERVICAL ESOPHAGEAL PHASE 04/07/2016 Cervical Esophageal Phase (No Data) Pudding Teaspoon -- Pudding Cup -- Honey Teaspoon -- Honey Cup -- Nectar Teaspoon -- Nectar Cup -- Nectar Straw -- Thin Teaspoon -- Thin Cup -- Thin Straw -- Puree -- Mechanical Soft -- Regular -- Multi-consistency -- Pill -- Cervical Esophageal Comment -- No flowsheet data found. Harlon Ditty, [place] CCC-SLP [phone] Dyanne Iha Riley Nearing 04/07/2016, 2:31 PM              Dg Hip Unilat With Pelvis 2-3 Views Right  04/04/2016  CLINICAL DATA:  Right hip pain after fall. EXAM: DG HIP (WITH OR WITHOUT PELVIS) 2-3V RIGHT COMPARISON:  None. FINDINGS: The right femoral neck appears foreshortened and has a different configuration when compared with scout radiograph from CT dated 09/24/2015. Findings are suspicious for an impacted subcapital femoral neck fracture. Left hip appears located and intact. IMPRESSION: 1. Cannot rule out subcapital femoral neck fracture involving the right hip. Further investigation with either MRI or CT is recommended. These results will be called to the ordering clinician or representative by the  Radiologist Assistant, and communication documented in the PACS or zVision Dashboard. Electronically Signed   By: Signa Kell M.D.   On: 04/04/2016 17:17    Microbiology: Recent Results (from the past 240 hour(s))  MRSA PCR Screening     Status: None   Collection Time: 04/05/16  4:16 AM  Result Value Ref Range Status   MRSA by PCR NEGATIVE NEGATIVE Final    Comment:        The GeneXpert MRSA Assay (FDA approved for NASAL specimens only), is one component of a comprehensive MRSA colonization surveillance program. It is not intended to diagnose MRSA infection nor to guide or monitor treatment for MRSA infections.      Labs: Basic Metabolic Panel:  Recent Labs Lab 04/04/16 1717 04/05/16 1218 04/06/16 0300 04/08/16 0512  NA 141  --  135 136  K 4.5  --  3.9 3.8  CL 104  --  102 100*  CO2 27  --  24 28  GLUCOSE 133*  --  122* 113*  BUN 38*  --  26* 35*  CREATININE 1.22 1.13 1.02 1.11  CALCIUM 9.9  --  8.7* 8.7*   Liver Function Tests: No results for input(s): AST, ALT, ALKPHOS, BILITOT, PROT, ALBUMIN in the last 168 hours.  Recent Labs Lab 04/04/16 2039  LIPASE 25   No results for input(s): AMMONIA in the last 168 hours. CBC:  Recent Labs Lab 04/04/16 1717 04/05/16 1218 04/06/16 0300 04/07/16 0331 04/08/16 0512  WBC 14.3* 14.8* 11.0* 17.1* 13.6*  NEUTROABS 11.9*  --   --   --   --   HGB 14.4 12.0* 11.9* 11.4* 11.9*  HCT 41.6 35.1* 34.9* 33.0* 35.1*  MCV 90.8 91.6 90.4 90.2 90.9  PLT 255 202 189 200 251    BNP (last 3 results)  Recent Labs  09/30/15 1619 04/05/16 0612  BNP 803.4* 171.4*    Signed:  Rayshun Kandler S MD.  [facility] 04/08/2016, 10:37 AM

## 2016-04-09 DIAGNOSIS — D500 Iron deficiency anemia secondary to blood loss (chronic): Secondary | ICD-10-CM | POA: Diagnosis not present

## 2016-04-09 DIAGNOSIS — N182 Chronic kidney disease, stage 2 (mild): Secondary | ICD-10-CM | POA: Diagnosis not present

## 2016-04-09 DIAGNOSIS — I639 Cerebral infarction, unspecified: Secondary | ICD-10-CM | POA: Diagnosis not present

## 2016-04-09 DIAGNOSIS — S7290XD Unspecified fracture of unspecified femur, subsequent encounter for closed fracture with routine healing: Secondary | ICD-10-CM | POA: Diagnosis not present

## 2016-04-09 DIAGNOSIS — I10 Essential (primary) hypertension: Secondary | ICD-10-CM | POA: Diagnosis not present

## 2016-04-09 DIAGNOSIS — K219 Gastro-esophageal reflux disease without esophagitis: Secondary | ICD-10-CM | POA: Diagnosis not present

## 2016-04-09 DIAGNOSIS — I5032 Chronic diastolic (congestive) heart failure: Secondary | ICD-10-CM | POA: Diagnosis not present

## 2016-04-21 DIAGNOSIS — S72001D Fracture of unspecified part of neck of right femur, subsequent encounter for closed fracture with routine healing: Secondary | ICD-10-CM | POA: Diagnosis not present

## 2016-04-23 ENCOUNTER — Other Ambulatory Visit: Payer: Self-pay | Admitting: Family Medicine

## 2016-04-29 DIAGNOSIS — Z7982 Long term (current) use of aspirin: Secondary | ICD-10-CM | POA: Diagnosis not present

## 2016-04-29 DIAGNOSIS — K219 Gastro-esophageal reflux disease without esophagitis: Secondary | ICD-10-CM | POA: Diagnosis not present

## 2016-04-29 DIAGNOSIS — I110 Hypertensive heart disease with heart failure: Secondary | ICD-10-CM | POA: Diagnosis not present

## 2016-04-29 DIAGNOSIS — Z8673 Personal history of transient ischemic attack (TIA), and cerebral infarction without residual deficits: Secondary | ICD-10-CM | POA: Diagnosis not present

## 2016-04-29 DIAGNOSIS — S72002D Fracture of unspecified part of neck of left femur, subsequent encounter for closed fracture with routine healing: Secondary | ICD-10-CM | POA: Diagnosis not present

## 2016-04-29 DIAGNOSIS — Z9181 History of falling: Secondary | ICD-10-CM | POA: Diagnosis not present

## 2016-04-29 DIAGNOSIS — I5032 Chronic diastolic (congestive) heart failure: Secondary | ICD-10-CM | POA: Diagnosis not present

## 2016-04-29 DIAGNOSIS — R1310 Dysphagia, unspecified: Secondary | ICD-10-CM | POA: Diagnosis not present

## 2016-04-29 DIAGNOSIS — Z96641 Presence of right artificial hip joint: Secondary | ICD-10-CM | POA: Diagnosis not present

## 2016-04-30 DIAGNOSIS — Z7982 Long term (current) use of aspirin: Secondary | ICD-10-CM | POA: Diagnosis not present

## 2016-04-30 DIAGNOSIS — I5032 Chronic diastolic (congestive) heart failure: Secondary | ICD-10-CM | POA: Diagnosis not present

## 2016-04-30 DIAGNOSIS — R1310 Dysphagia, unspecified: Secondary | ICD-10-CM | POA: Diagnosis not present

## 2016-04-30 DIAGNOSIS — K219 Gastro-esophageal reflux disease without esophagitis: Secondary | ICD-10-CM | POA: Diagnosis not present

## 2016-04-30 DIAGNOSIS — Z8673 Personal history of transient ischemic attack (TIA), and cerebral infarction without residual deficits: Secondary | ICD-10-CM | POA: Diagnosis not present

## 2016-04-30 DIAGNOSIS — I110 Hypertensive heart disease with heart failure: Secondary | ICD-10-CM | POA: Diagnosis not present

## 2016-05-02 DIAGNOSIS — R1310 Dysphagia, unspecified: Secondary | ICD-10-CM | POA: Diagnosis not present

## 2016-05-02 DIAGNOSIS — I5032 Chronic diastolic (congestive) heart failure: Secondary | ICD-10-CM | POA: Diagnosis not present

## 2016-05-02 DIAGNOSIS — Z8673 Personal history of transient ischemic attack (TIA), and cerebral infarction without residual deficits: Secondary | ICD-10-CM | POA: Diagnosis not present

## 2016-05-02 DIAGNOSIS — K219 Gastro-esophageal reflux disease without esophagitis: Secondary | ICD-10-CM | POA: Diagnosis not present

## 2016-05-02 DIAGNOSIS — Z7982 Long term (current) use of aspirin: Secondary | ICD-10-CM | POA: Diagnosis not present

## 2016-05-02 DIAGNOSIS — I110 Hypertensive heart disease with heart failure: Secondary | ICD-10-CM | POA: Diagnosis not present

## 2016-05-05 DIAGNOSIS — K219 Gastro-esophageal reflux disease without esophagitis: Secondary | ICD-10-CM | POA: Diagnosis not present

## 2016-05-05 DIAGNOSIS — Z7982 Long term (current) use of aspirin: Secondary | ICD-10-CM | POA: Diagnosis not present

## 2016-05-05 DIAGNOSIS — I5032 Chronic diastolic (congestive) heart failure: Secondary | ICD-10-CM | POA: Diagnosis not present

## 2016-05-05 DIAGNOSIS — R1310 Dysphagia, unspecified: Secondary | ICD-10-CM | POA: Diagnosis not present

## 2016-05-05 DIAGNOSIS — Z8673 Personal history of transient ischemic attack (TIA), and cerebral infarction without residual deficits: Secondary | ICD-10-CM | POA: Diagnosis not present

## 2016-05-05 DIAGNOSIS — I110 Hypertensive heart disease with heart failure: Secondary | ICD-10-CM | POA: Diagnosis not present

## 2016-05-06 DIAGNOSIS — Z7982 Long term (current) use of aspirin: Secondary | ICD-10-CM | POA: Diagnosis not present

## 2016-05-06 DIAGNOSIS — K219 Gastro-esophageal reflux disease without esophagitis: Secondary | ICD-10-CM | POA: Diagnosis not present

## 2016-05-06 DIAGNOSIS — I110 Hypertensive heart disease with heart failure: Secondary | ICD-10-CM | POA: Diagnosis not present

## 2016-05-06 DIAGNOSIS — R1310 Dysphagia, unspecified: Secondary | ICD-10-CM | POA: Diagnosis not present

## 2016-05-06 DIAGNOSIS — Z8673 Personal history of transient ischemic attack (TIA), and cerebral infarction without residual deficits: Secondary | ICD-10-CM | POA: Diagnosis not present

## 2016-05-06 DIAGNOSIS — I5032 Chronic diastolic (congestive) heart failure: Secondary | ICD-10-CM | POA: Diagnosis not present

## 2016-05-07 DIAGNOSIS — Z8673 Personal history of transient ischemic attack (TIA), and cerebral infarction without residual deficits: Secondary | ICD-10-CM | POA: Diagnosis not present

## 2016-05-07 DIAGNOSIS — I5032 Chronic diastolic (congestive) heart failure: Secondary | ICD-10-CM | POA: Diagnosis not present

## 2016-05-07 DIAGNOSIS — R1310 Dysphagia, unspecified: Secondary | ICD-10-CM | POA: Diagnosis not present

## 2016-05-07 DIAGNOSIS — I110 Hypertensive heart disease with heart failure: Secondary | ICD-10-CM | POA: Diagnosis not present

## 2016-05-07 DIAGNOSIS — K219 Gastro-esophageal reflux disease without esophagitis: Secondary | ICD-10-CM | POA: Diagnosis not present

## 2016-05-07 DIAGNOSIS — Z7982 Long term (current) use of aspirin: Secondary | ICD-10-CM | POA: Diagnosis not present

## 2016-05-09 DIAGNOSIS — K219 Gastro-esophageal reflux disease without esophagitis: Secondary | ICD-10-CM | POA: Diagnosis not present

## 2016-05-09 DIAGNOSIS — R1310 Dysphagia, unspecified: Secondary | ICD-10-CM | POA: Diagnosis not present

## 2016-05-09 DIAGNOSIS — Z8673 Personal history of transient ischemic attack (TIA), and cerebral infarction without residual deficits: Secondary | ICD-10-CM | POA: Diagnosis not present

## 2016-05-09 DIAGNOSIS — I110 Hypertensive heart disease with heart failure: Secondary | ICD-10-CM | POA: Diagnosis not present

## 2016-05-09 DIAGNOSIS — I5032 Chronic diastolic (congestive) heart failure: Secondary | ICD-10-CM | POA: Diagnosis not present

## 2016-05-09 DIAGNOSIS — Z7982 Long term (current) use of aspirin: Secondary | ICD-10-CM | POA: Diagnosis not present

## 2016-05-12 DIAGNOSIS — I5032 Chronic diastolic (congestive) heart failure: Secondary | ICD-10-CM | POA: Diagnosis not present

## 2016-05-12 DIAGNOSIS — M6281 Muscle weakness (generalized): Secondary | ICD-10-CM | POA: Diagnosis not present

## 2016-05-12 DIAGNOSIS — K219 Gastro-esophageal reflux disease without esophagitis: Secondary | ICD-10-CM | POA: Diagnosis not present

## 2016-05-12 DIAGNOSIS — Z8673 Personal history of transient ischemic attack (TIA), and cerebral infarction without residual deficits: Secondary | ICD-10-CM | POA: Diagnosis not present

## 2016-05-12 DIAGNOSIS — R1310 Dysphagia, unspecified: Secondary | ICD-10-CM | POA: Diagnosis not present

## 2016-05-12 DIAGNOSIS — Z7982 Long term (current) use of aspirin: Secondary | ICD-10-CM | POA: Diagnosis not present

## 2016-05-12 DIAGNOSIS — I110 Hypertensive heart disease with heart failure: Secondary | ICD-10-CM | POA: Diagnosis not present

## 2016-05-12 DIAGNOSIS — I5033 Acute on chronic diastolic (congestive) heart failure: Secondary | ICD-10-CM | POA: Diagnosis not present

## 2016-05-13 DIAGNOSIS — I110 Hypertensive heart disease with heart failure: Secondary | ICD-10-CM | POA: Diagnosis not present

## 2016-05-13 DIAGNOSIS — Z7982 Long term (current) use of aspirin: Secondary | ICD-10-CM | POA: Diagnosis not present

## 2016-05-13 DIAGNOSIS — K219 Gastro-esophageal reflux disease without esophagitis: Secondary | ICD-10-CM | POA: Diagnosis not present

## 2016-05-13 DIAGNOSIS — Z8673 Personal history of transient ischemic attack (TIA), and cerebral infarction without residual deficits: Secondary | ICD-10-CM | POA: Diagnosis not present

## 2016-05-13 DIAGNOSIS — I5032 Chronic diastolic (congestive) heart failure: Secondary | ICD-10-CM | POA: Diagnosis not present

## 2016-05-13 DIAGNOSIS — R1310 Dysphagia, unspecified: Secondary | ICD-10-CM | POA: Diagnosis not present

## 2016-05-14 DIAGNOSIS — I5032 Chronic diastolic (congestive) heart failure: Secondary | ICD-10-CM | POA: Diagnosis not present

## 2016-05-14 DIAGNOSIS — Z7982 Long term (current) use of aspirin: Secondary | ICD-10-CM | POA: Diagnosis not present

## 2016-05-14 DIAGNOSIS — Z8673 Personal history of transient ischemic attack (TIA), and cerebral infarction without residual deficits: Secondary | ICD-10-CM | POA: Diagnosis not present

## 2016-05-14 DIAGNOSIS — I110 Hypertensive heart disease with heart failure: Secondary | ICD-10-CM | POA: Diagnosis not present

## 2016-05-14 DIAGNOSIS — R1310 Dysphagia, unspecified: Secondary | ICD-10-CM | POA: Diagnosis not present

## 2016-05-14 DIAGNOSIS — K219 Gastro-esophageal reflux disease without esophagitis: Secondary | ICD-10-CM | POA: Diagnosis not present

## 2016-05-16 DIAGNOSIS — Z7982 Long term (current) use of aspirin: Secondary | ICD-10-CM | POA: Diagnosis not present

## 2016-05-16 DIAGNOSIS — I110 Hypertensive heart disease with heart failure: Secondary | ICD-10-CM | POA: Diagnosis not present

## 2016-05-16 DIAGNOSIS — K219 Gastro-esophageal reflux disease without esophagitis: Secondary | ICD-10-CM | POA: Diagnosis not present

## 2016-05-16 DIAGNOSIS — R1310 Dysphagia, unspecified: Secondary | ICD-10-CM | POA: Diagnosis not present

## 2016-05-16 DIAGNOSIS — Z8673 Personal history of transient ischemic attack (TIA), and cerebral infarction without residual deficits: Secondary | ICD-10-CM | POA: Diagnosis not present

## 2016-05-16 DIAGNOSIS — I5032 Chronic diastolic (congestive) heart failure: Secondary | ICD-10-CM | POA: Diagnosis not present

## 2016-05-19 DIAGNOSIS — S72001D Fracture of unspecified part of neck of right femur, subsequent encounter for closed fracture with routine healing: Secondary | ICD-10-CM | POA: Diagnosis not present

## 2016-05-20 DIAGNOSIS — Z8673 Personal history of transient ischemic attack (TIA), and cerebral infarction without residual deficits: Secondary | ICD-10-CM | POA: Diagnosis not present

## 2016-05-20 DIAGNOSIS — I110 Hypertensive heart disease with heart failure: Secondary | ICD-10-CM | POA: Diagnosis not present

## 2016-05-20 DIAGNOSIS — Z7982 Long term (current) use of aspirin: Secondary | ICD-10-CM | POA: Diagnosis not present

## 2016-05-20 DIAGNOSIS — K219 Gastro-esophageal reflux disease without esophagitis: Secondary | ICD-10-CM | POA: Diagnosis not present

## 2016-05-20 DIAGNOSIS — I5032 Chronic diastolic (congestive) heart failure: Secondary | ICD-10-CM | POA: Diagnosis not present

## 2016-05-20 DIAGNOSIS — R1310 Dysphagia, unspecified: Secondary | ICD-10-CM | POA: Diagnosis not present

## 2016-05-22 DIAGNOSIS — I5032 Chronic diastolic (congestive) heart failure: Secondary | ICD-10-CM | POA: Diagnosis not present

## 2016-05-22 DIAGNOSIS — I110 Hypertensive heart disease with heart failure: Secondary | ICD-10-CM | POA: Diagnosis not present

## 2016-05-22 DIAGNOSIS — Z8673 Personal history of transient ischemic attack (TIA), and cerebral infarction without residual deficits: Secondary | ICD-10-CM | POA: Diagnosis not present

## 2016-05-22 DIAGNOSIS — R1310 Dysphagia, unspecified: Secondary | ICD-10-CM | POA: Diagnosis not present

## 2016-05-22 DIAGNOSIS — K219 Gastro-esophageal reflux disease without esophagitis: Secondary | ICD-10-CM | POA: Diagnosis not present

## 2016-05-22 DIAGNOSIS — Z7982 Long term (current) use of aspirin: Secondary | ICD-10-CM | POA: Diagnosis not present

## 2016-05-27 DIAGNOSIS — Z8673 Personal history of transient ischemic attack (TIA), and cerebral infarction without residual deficits: Secondary | ICD-10-CM | POA: Diagnosis not present

## 2016-05-27 DIAGNOSIS — I5032 Chronic diastolic (congestive) heart failure: Secondary | ICD-10-CM | POA: Diagnosis not present

## 2016-05-27 DIAGNOSIS — K219 Gastro-esophageal reflux disease without esophagitis: Secondary | ICD-10-CM | POA: Diagnosis not present

## 2016-05-27 DIAGNOSIS — I110 Hypertensive heart disease with heart failure: Secondary | ICD-10-CM | POA: Diagnosis not present

## 2016-05-27 DIAGNOSIS — R1310 Dysphagia, unspecified: Secondary | ICD-10-CM | POA: Diagnosis not present

## 2016-05-27 DIAGNOSIS — Z7982 Long term (current) use of aspirin: Secondary | ICD-10-CM | POA: Diagnosis not present

## 2016-05-29 DIAGNOSIS — R1310 Dysphagia, unspecified: Secondary | ICD-10-CM | POA: Diagnosis not present

## 2016-05-29 DIAGNOSIS — Z7982 Long term (current) use of aspirin: Secondary | ICD-10-CM | POA: Diagnosis not present

## 2016-05-29 DIAGNOSIS — K219 Gastro-esophageal reflux disease without esophagitis: Secondary | ICD-10-CM | POA: Diagnosis not present

## 2016-05-29 DIAGNOSIS — I5032 Chronic diastolic (congestive) heart failure: Secondary | ICD-10-CM | POA: Diagnosis not present

## 2016-05-29 DIAGNOSIS — I110 Hypertensive heart disease with heart failure: Secondary | ICD-10-CM | POA: Diagnosis not present

## 2016-05-29 DIAGNOSIS — Z8673 Personal history of transient ischemic attack (TIA), and cerebral infarction without residual deficits: Secondary | ICD-10-CM | POA: Diagnosis not present

## 2016-05-30 DIAGNOSIS — I110 Hypertensive heart disease with heart failure: Secondary | ICD-10-CM | POA: Diagnosis not present

## 2016-05-30 DIAGNOSIS — R1310 Dysphagia, unspecified: Secondary | ICD-10-CM | POA: Diagnosis not present

## 2016-05-30 DIAGNOSIS — Z7982 Long term (current) use of aspirin: Secondary | ICD-10-CM | POA: Diagnosis not present

## 2016-05-30 DIAGNOSIS — I5032 Chronic diastolic (congestive) heart failure: Secondary | ICD-10-CM | POA: Diagnosis not present

## 2016-05-30 DIAGNOSIS — K219 Gastro-esophageal reflux disease without esophagitis: Secondary | ICD-10-CM | POA: Diagnosis not present

## 2016-05-30 DIAGNOSIS — Z8673 Personal history of transient ischemic attack (TIA), and cerebral infarction without residual deficits: Secondary | ICD-10-CM | POA: Diagnosis not present

## 2016-06-02 DIAGNOSIS — I5032 Chronic diastolic (congestive) heart failure: Secondary | ICD-10-CM | POA: Diagnosis not present

## 2016-06-02 DIAGNOSIS — Z7982 Long term (current) use of aspirin: Secondary | ICD-10-CM | POA: Diagnosis not present

## 2016-06-02 DIAGNOSIS — Z8673 Personal history of transient ischemic attack (TIA), and cerebral infarction without residual deficits: Secondary | ICD-10-CM | POA: Diagnosis not present

## 2016-06-02 DIAGNOSIS — R1310 Dysphagia, unspecified: Secondary | ICD-10-CM | POA: Diagnosis not present

## 2016-06-02 DIAGNOSIS — K219 Gastro-esophageal reflux disease without esophagitis: Secondary | ICD-10-CM | POA: Diagnosis not present

## 2016-06-02 DIAGNOSIS — I110 Hypertensive heart disease with heart failure: Secondary | ICD-10-CM | POA: Diagnosis not present

## 2016-06-03 DIAGNOSIS — I5032 Chronic diastolic (congestive) heart failure: Secondary | ICD-10-CM | POA: Diagnosis not present

## 2016-06-03 DIAGNOSIS — I110 Hypertensive heart disease with heart failure: Secondary | ICD-10-CM | POA: Diagnosis not present

## 2016-06-03 DIAGNOSIS — K219 Gastro-esophageal reflux disease without esophagitis: Secondary | ICD-10-CM | POA: Diagnosis not present

## 2016-06-03 DIAGNOSIS — Z8673 Personal history of transient ischemic attack (TIA), and cerebral infarction without residual deficits: Secondary | ICD-10-CM | POA: Diagnosis not present

## 2016-06-03 DIAGNOSIS — Z7982 Long term (current) use of aspirin: Secondary | ICD-10-CM | POA: Diagnosis not present

## 2016-06-03 DIAGNOSIS — R1310 Dysphagia, unspecified: Secondary | ICD-10-CM | POA: Diagnosis not present

## 2016-06-04 DIAGNOSIS — Z8673 Personal history of transient ischemic attack (TIA), and cerebral infarction without residual deficits: Secondary | ICD-10-CM | POA: Diagnosis not present

## 2016-06-04 DIAGNOSIS — I110 Hypertensive heart disease with heart failure: Secondary | ICD-10-CM | POA: Diagnosis not present

## 2016-06-04 DIAGNOSIS — Z7982 Long term (current) use of aspirin: Secondary | ICD-10-CM | POA: Diagnosis not present

## 2016-06-04 DIAGNOSIS — K219 Gastro-esophageal reflux disease without esophagitis: Secondary | ICD-10-CM | POA: Diagnosis not present

## 2016-06-04 DIAGNOSIS — I5032 Chronic diastolic (congestive) heart failure: Secondary | ICD-10-CM | POA: Diagnosis not present

## 2016-06-04 DIAGNOSIS — R1310 Dysphagia, unspecified: Secondary | ICD-10-CM | POA: Diagnosis not present

## 2016-06-11 DIAGNOSIS — K219 Gastro-esophageal reflux disease without esophagitis: Secondary | ICD-10-CM | POA: Diagnosis not present

## 2016-06-11 DIAGNOSIS — Z8673 Personal history of transient ischemic attack (TIA), and cerebral infarction without residual deficits: Secondary | ICD-10-CM | POA: Diagnosis not present

## 2016-06-11 DIAGNOSIS — R1310 Dysphagia, unspecified: Secondary | ICD-10-CM | POA: Diagnosis not present

## 2016-06-11 DIAGNOSIS — I5032 Chronic diastolic (congestive) heart failure: Secondary | ICD-10-CM | POA: Diagnosis not present

## 2016-06-11 DIAGNOSIS — I110 Hypertensive heart disease with heart failure: Secondary | ICD-10-CM | POA: Diagnosis not present

## 2016-06-11 DIAGNOSIS — Z7982 Long term (current) use of aspirin: Secondary | ICD-10-CM | POA: Diagnosis not present

## 2016-06-19 DIAGNOSIS — Z7982 Long term (current) use of aspirin: Secondary | ICD-10-CM | POA: Diagnosis not present

## 2016-06-19 DIAGNOSIS — Z8673 Personal history of transient ischemic attack (TIA), and cerebral infarction without residual deficits: Secondary | ICD-10-CM | POA: Diagnosis not present

## 2016-06-19 DIAGNOSIS — K219 Gastro-esophageal reflux disease without esophagitis: Secondary | ICD-10-CM | POA: Diagnosis not present

## 2016-06-19 DIAGNOSIS — I110 Hypertensive heart disease with heart failure: Secondary | ICD-10-CM | POA: Diagnosis not present

## 2016-06-19 DIAGNOSIS — R1310 Dysphagia, unspecified: Secondary | ICD-10-CM | POA: Diagnosis not present

## 2016-06-19 DIAGNOSIS — I5032 Chronic diastolic (congestive) heart failure: Secondary | ICD-10-CM | POA: Diagnosis not present

## 2016-06-26 DIAGNOSIS — I110 Hypertensive heart disease with heart failure: Secondary | ICD-10-CM | POA: Diagnosis not present

## 2016-06-26 DIAGNOSIS — I5032 Chronic diastolic (congestive) heart failure: Secondary | ICD-10-CM | POA: Diagnosis not present

## 2016-06-26 DIAGNOSIS — R1310 Dysphagia, unspecified: Secondary | ICD-10-CM | POA: Diagnosis not present

## 2016-06-26 DIAGNOSIS — K219 Gastro-esophageal reflux disease without esophagitis: Secondary | ICD-10-CM | POA: Diagnosis not present

## 2016-06-26 DIAGNOSIS — Z8673 Personal history of transient ischemic attack (TIA), and cerebral infarction without residual deficits: Secondary | ICD-10-CM | POA: Diagnosis not present

## 2016-06-26 DIAGNOSIS — Z7982 Long term (current) use of aspirin: Secondary | ICD-10-CM | POA: Diagnosis not present

## 2016-07-21 ENCOUNTER — Telehealth: Payer: Self-pay | Admitting: Family Medicine

## 2016-07-21 DIAGNOSIS — S72001D Fracture of unspecified part of neck of right femur, subsequent encounter for closed fracture with routine healing: Secondary | ICD-10-CM | POA: Diagnosis not present

## 2016-07-21 NOTE — Telephone Encounter (Signed)
Pt with fractured Femur.  Needs Ortho follow up with Dr Margarita Ranaimothy Murphy.  Dx [id number]  Approved # [id number]   6 visits from 07/21/16 - 01/17/17.

## 2016-10-17 ENCOUNTER — Other Ambulatory Visit: Payer: Self-pay | Admitting: Family Medicine

## 2016-11-20 ENCOUNTER — Ambulatory Visit (INDEPENDENT_AMBULATORY_CARE_PROVIDER_SITE_OTHER): Payer: Commercial Managed Care - HMO | Admitting: Family Medicine

## 2016-11-20 ENCOUNTER — Encounter: Payer: Self-pay | Admitting: Family Medicine

## 2016-11-20 VITALS — BP 126/74 | HR 72 | Temp 97.4°F | Resp 18 | Ht 64.0 in | Wt 156.0 lb

## 2016-11-20 DIAGNOSIS — E7800 Pure hypercholesterolemia, unspecified: Secondary | ICD-10-CM | POA: Diagnosis not present

## 2016-11-20 DIAGNOSIS — R413 Other amnesia: Secondary | ICD-10-CM

## 2016-11-20 DIAGNOSIS — Z8673 Personal history of transient ischemic attack (TIA), and cerebral infarction without residual deficits: Secondary | ICD-10-CM | POA: Diagnosis not present

## 2016-11-20 DIAGNOSIS — I10 Essential (primary) hypertension: Secondary | ICD-10-CM

## 2016-11-20 LAB — COMPLETE METABOLIC PANEL WITH GFR
ALBUMIN: 4.6 g/dL (ref 3.6–5.1)
ALT: 16 U/L (ref 9–46)
AST: 22 U/L (ref 10–35)
Alkaline Phosphatase: 48 U/L (ref 40–115)
BILIRUBIN TOTAL: 0.7 mg/dL (ref 0.2–1.2)
BUN: 24 mg/dL (ref 7–25)
CO2: 26 mmol/L (ref 20–31)
CREATININE: 1.1 mg/dL (ref 0.70–1.11)
Calcium: 9.6 mg/dL (ref 8.6–10.3)
Chloride: 104 mmol/L (ref 98–110)
GFR, EST AFRICAN AMERICAN: 69 mL/min (ref >=60)
GFR, Est Non African American: 60 mL/min (ref >=60)
Glucose, Bld: 85 mg/dL (ref 70–99)
Potassium: 4.4 mmol/L (ref 3.5–5.3)
Sodium: 141 mmol/L (ref 135–146)
TOTAL PROTEIN: 7.4 g/dL (ref 6.1–8.1)

## 2016-11-20 LAB — CBC WITH DIFFERENTIAL/PLATELET
BASOS PCT: 0 %
Basophils Absolute: 0 cells/uL (ref 0–200)
EOS ABS: 72 {cells}/uL (ref 15–500)
Eosinophils Relative: 1 %
HCT: 39.1 % (ref 38.5–50.0)
HEMOGLOBIN: 13.1 g/dL (ref 13.0–17.0)
LYMPHS ABS: 2160 {cells}/uL (ref 850–3900)
Lymphocytes Relative: 30 %
MCH: 31.7 pg (ref 27.0–33.0)
MCHC: 33.5 g/dL (ref 32.0–36.0)
MCV: 94.7 fL (ref 80.0–100.0)
MONO ABS: 576 {cells}/uL (ref 200–950)
MPV: 10.7 fL (ref 7.5–12.5)
Monocytes Relative: 8 %
Neutro Abs: 4392 cells/uL (ref 1500–7800)
Neutrophils Relative %: 61 %
PLATELETS: 283 10*3/uL (ref 140–400)
RBC: 4.13 MIL/uL — ABNORMAL LOW (ref 4.20–5.80)
RDW: 13.5 % (ref 11.0–15.0)
WBC: 7.2 10*3/uL (ref 3.8–10.8)

## 2016-11-20 LAB — LIPID PANEL
Cholesterol: 128 mg/dL (ref <200)
HDL: 44 mg/dL (ref >40)
LDL CALC: 55 mg/dL (ref <100)
TRIGLYCERIDES: 144 mg/dL (ref <150)
Total CHOL/HDL Ratio: 2.9 Ratio (ref <5.0)
VLDL: 29 mg/dL (ref <30)

## 2016-11-20 NOTE — Progress Notes (Signed)
Subjective:    Patient ID: Eric Berger, male    DOB: Dec 19, 1925, 83 y.o.   MRN: [id number]  HPI  He is here today for follow-up of his multiple medical problems. He also has a question about driving.  Patient has a history of a CVA. He also recently fell and suffered a right hip fracture. From a muscular standpoint, he has good strength in his right leg. He has full flexion and extension of his ankle his knee and his hip with minimal pain. He is walking without a walker. His physical reaction time seems pretty good for his age. My concern is his memory. I performed a Mini-Mental status exam today. When I asked him what the date was, he told me 1988. He is only able to remember 2 out of 3 objects. He missed 2 letters in spelling world in reverse. He was unable to perform serial sevens. He had a difficult time on the mini cog/clock drawing drawing the correct time although the numbers were in the proper orientation and spacing. All this is suggestive of mild dementia. He denies any chest pain shortness of breath or dyspnea on exertion. He denies any myalgias or right upper quadrant pain Past Medical History:  Diagnosis Date  . Cataracts, bilateral   . Chronic diastolic CHF (congestive heart failure) (HCC)   . GERD (gastroesophageal reflux disease)   . Hearing loss   . Hernia   . High cholesterol   . Hypertension   . Renal disorder   . Stroke [facility])    Past Surgical History:  Procedure Laterality Date  . CHOLECYSTECTOMY    . EYE SURGERY    . HERNIA REPAIR    . HIP ARTHROPLASTY Right 04/05/2016   Procedure: RIGHT HIP HEMIARTHROPLASTY;  Surgeon: Sheral Apley, MD;  Location: [facility] OR;  Service: Orthopedics;  Laterality: Right;   Current Outpatient Prescriptions on File Prior to Visit  Medication Sig Dispense Refill  . aspirin 325 MG tablet Take 325 mg by mouth daily.    . Cholecalciferol (VITAMIN D-3) 1000 units CAPS Take 1 capsule by mouth daily.    Marland Kitchen GARLIC PO Take 1 tablet by  mouth daily.    Marland Kitchen glucosamine-chondroitin 500-400 MG tablet Take 1 tablet by mouth 2 (two) times daily.     . hydrochlorothiazide (HYDRODIURIL) 25 MG tablet Take 1 tablet (25 mg total) by mouth daily. 90 tablet 3  . HYDROcodone-acetaminophen (NORCO) 5-325 MG tablet Take 1 tablet by mouth every 6 (six) hours as needed for moderate pain. 30 tablet 0  . losartan (COZAAR) 50 MG tablet TAKE 1 TABLET (50 MG TOTAL) BY MOUTH DAILY. 30 tablet 11  . Magnesium 250 MG TABS Take 1 tablet by mouth daily.    . Multiple Vitamin (MULTIVITAMIN) tablet Take 1 tablet by mouth daily.    . Saw Palmetto 450 MG CAPS Take 1 capsule by mouth daily.    . simvastatin (ZOCOR) 40 MG tablet TAKE ONE TABLET BY MOUTH ONCE DAILY AT 6PM 90 tablet 2   No current facility-administered medications on file prior to visit.    No Known Allergies Social History   Social History  . Marital status: Married    Spouse name: N/A  . Number of children: N/A  . Years of education: N/A   Occupational History  . Not on file.   Social History Main Topics  . Smoking status: Former Smoker    Quit date: 06/08/1962  . Smokeless tobacco: Never Used  .  Alcohol use No  . Drug use: No  . Sexual activity: Yes     Comment: Married.  Retired.   Other Topics Concern  . Not on file   Social History Narrative  . No narrative on file     Review of Systems  All other systems reviewed and are negative.      Objective:   Physical Exam  Constitutional: He is oriented to person, place, and time. He appears well-developed and well-nourished.  Neck: Neck supple.  Cardiovascular: Normal rate, regular rhythm and normal heart sounds.   No murmur heard. Pulmonary/Chest: Effort normal and breath sounds normal. No respiratory distress. He has no wheezes. He has no rales.  Abdominal: Soft. Bowel sounds are normal. He exhibits no distension. There is no tenderness. There is no rebound and no guarding.  Lymphadenopathy:    He has no cervical  adenopathy.  Neurological: He is alert and oriented to person, place, and time. He has normal reflexes. He displays normal reflexes. No cranial nerve deficit. He exhibits normal muscle tone. Coordination normal.  Vitals reviewed.         Assessment & Plan:  Benign essential HTN - Plan: CBC with Differential/Platelet, COMPLETE METABOLIC PANEL WITH GFR, Lipid panel  History of stroke  Pure hypercholesterolemia  Memory loss  Blood pressure today is well controlled. I will check a fasting lipid panel. Goal LDL cholesterol is less than 70. Patient has early onset dementia and memory loss. At the present time I recommended that he avoid driving on highway, busy roads, driving at night, driving more than 45 miles per hour. I am still comfortable with him driving on local roads around his home there are 2 lane in the daytime. I explained this to the patient.

## 2017-01-12 DIAGNOSIS — H524 Presbyopia: Secondary | ICD-10-CM | POA: Diagnosis not present

## 2017-03-04 IMAGING — CR DG PORTABLE PELVIS
2 series · 2 of 2 positions shown · non-contrast
Comparison: CT scan 04/04/2016.

CLINICAL DATA: Status post right hip replacement.

EXAM:
PORTABLE PELVIS 1-2 VIEWS

[AP (1 of 2)]
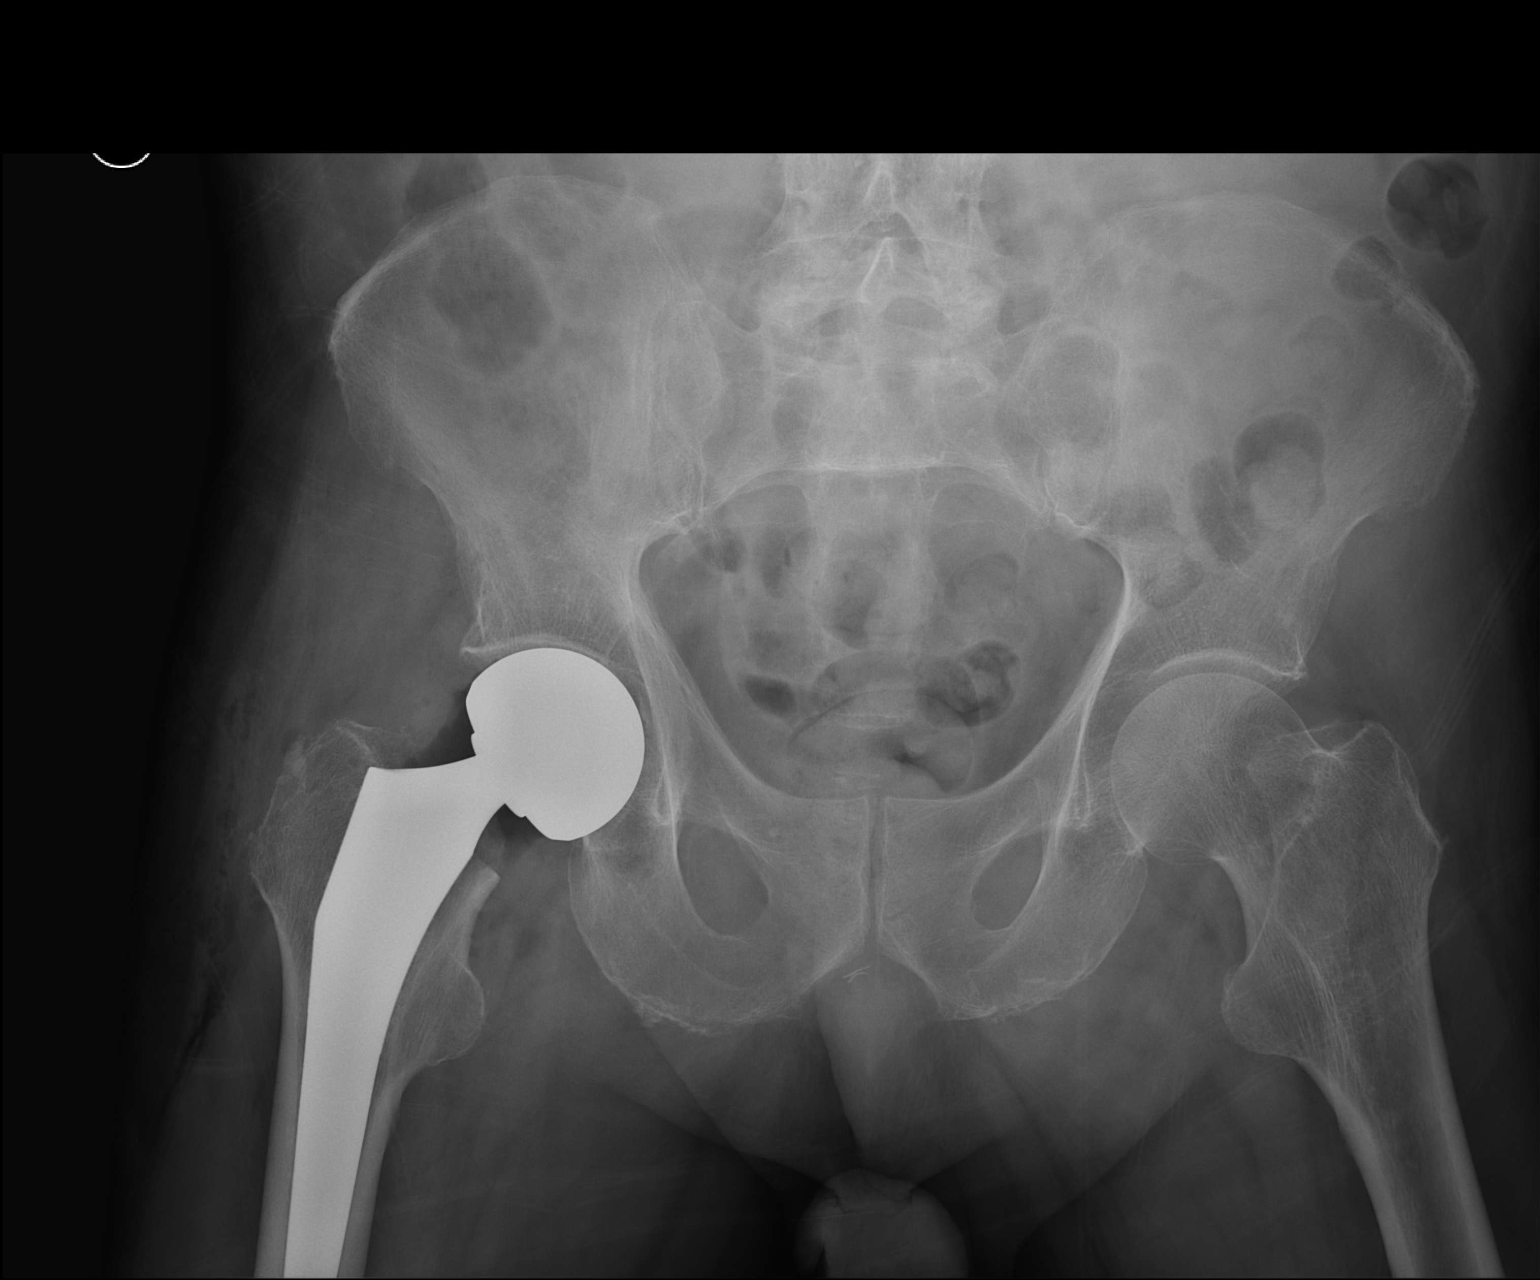

[AP (2 of 2)]
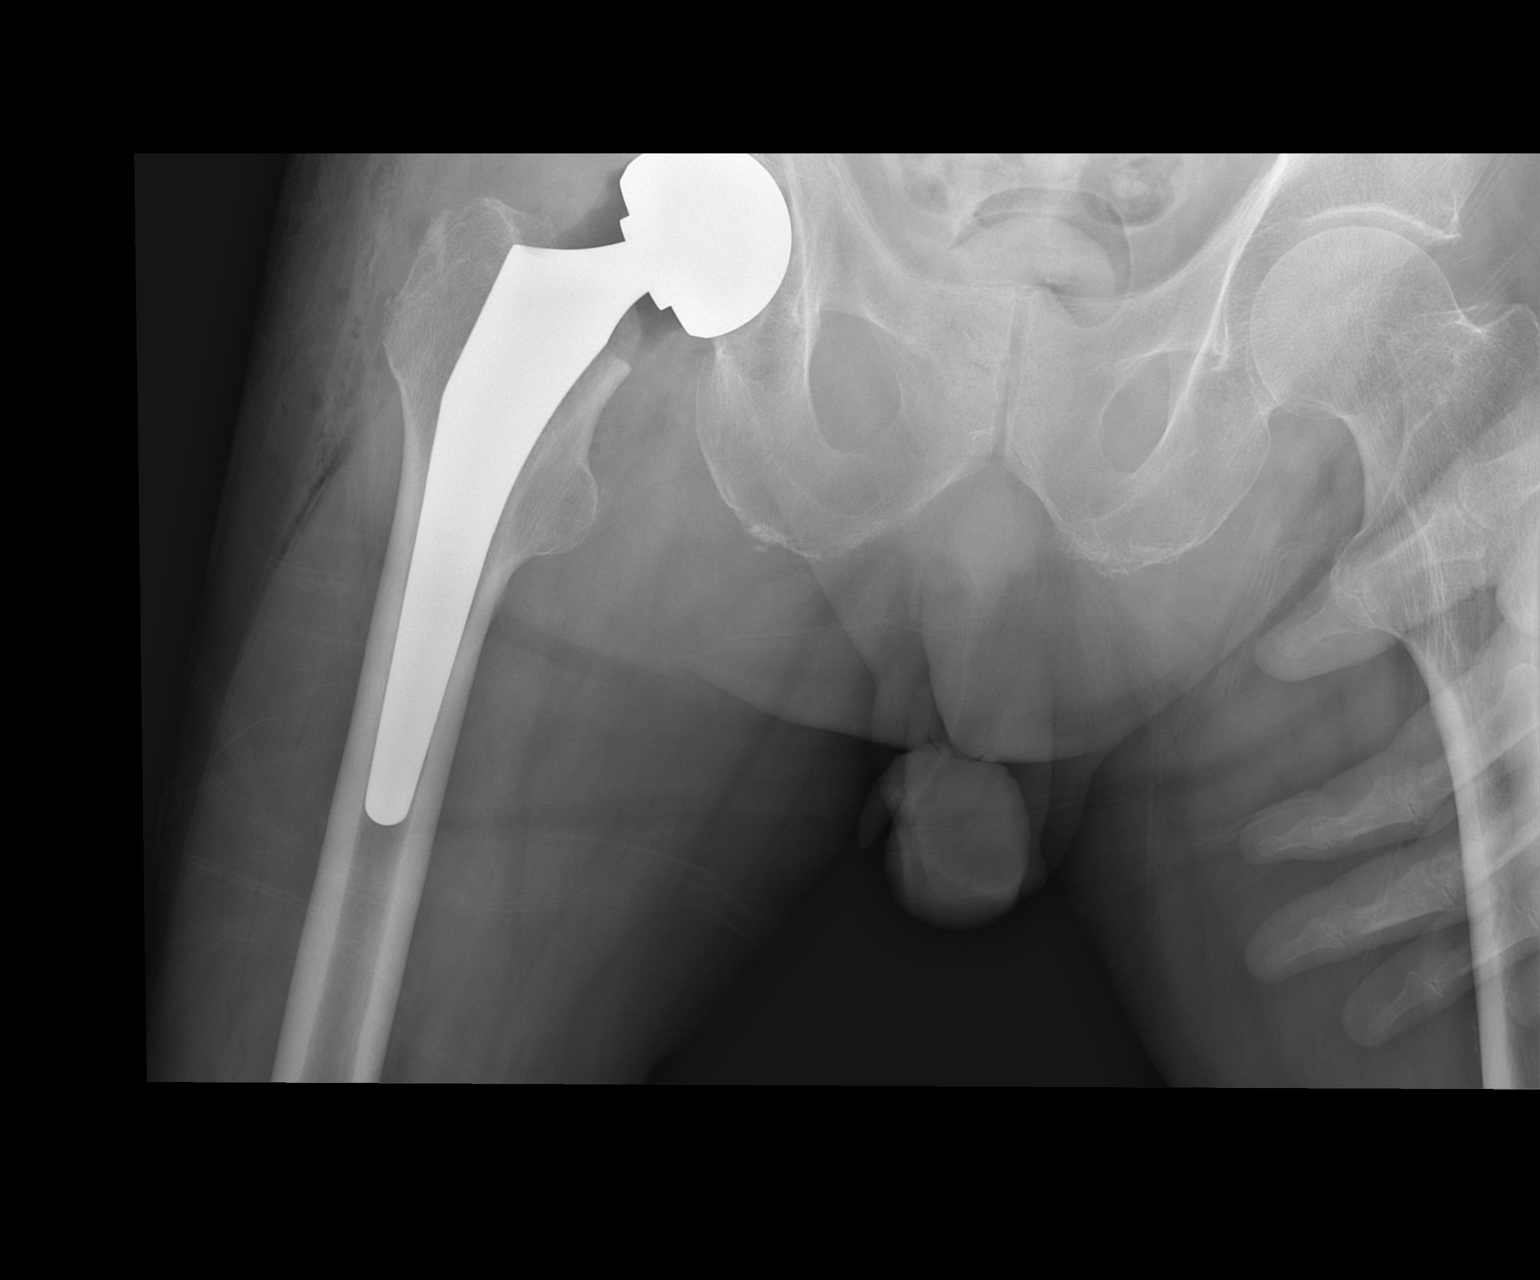

[2 of 2 positions shown; findings below may reference images not displayed]

FINDINGS: Patient is status post right hip hemiarthroplasty. No evidence for
hardware complications. Gas in the overlying soft tissues compatible
with the immediate postoperative state.
IMPRESSION: Status post right hip hemiarthroplasty without complicating
features.

## 2017-03-06 IMAGING — RF DG SWALLOWING FUNCTION - NRPT [facility]
1 series · 18 of 24 positions shown · non-contrast
Comparison: none

[Series 1: run · 24 acquisitions, 18 frames shown]
[im 1/24]
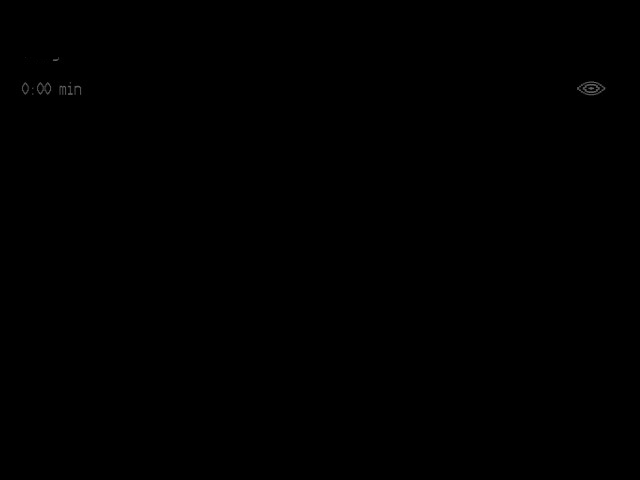
[im 3/24]
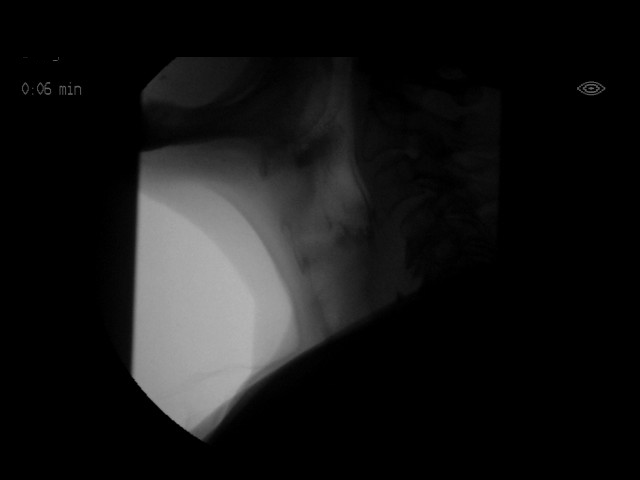
[im 4/24]
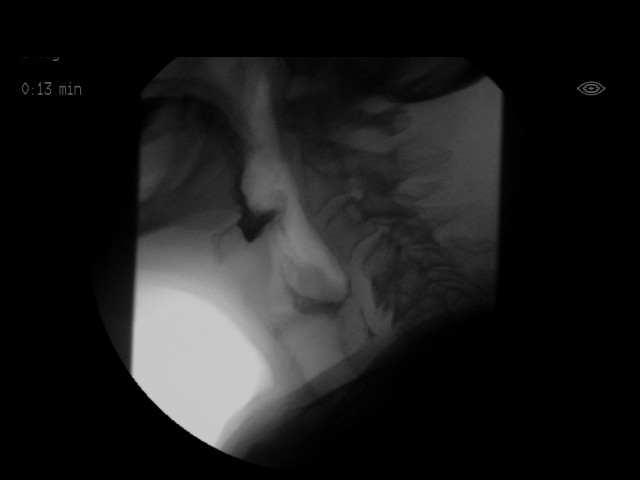
[im 5/24]
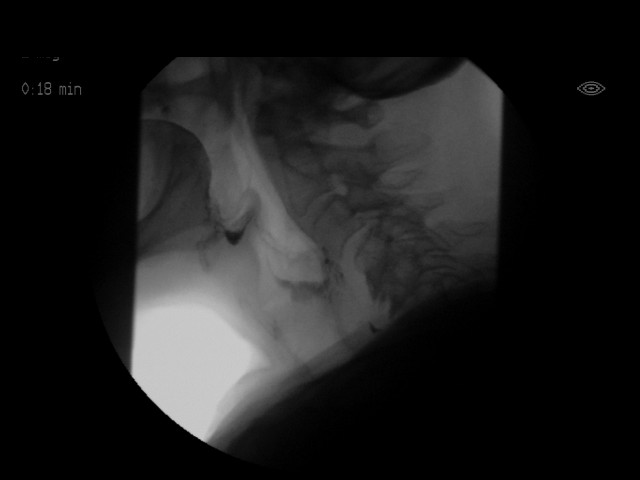
[im 7/24]
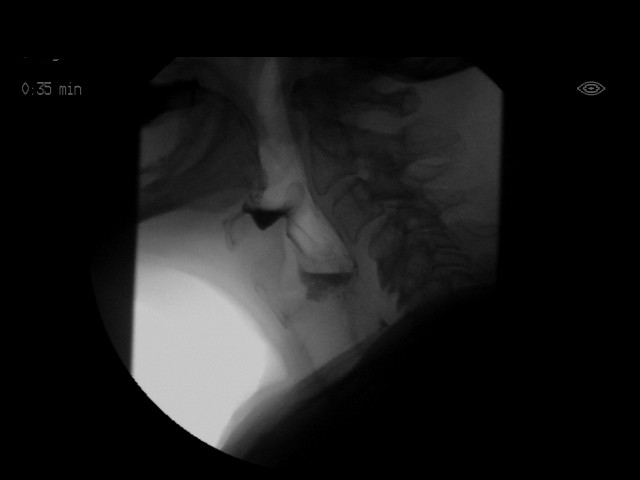
[im 8/24]
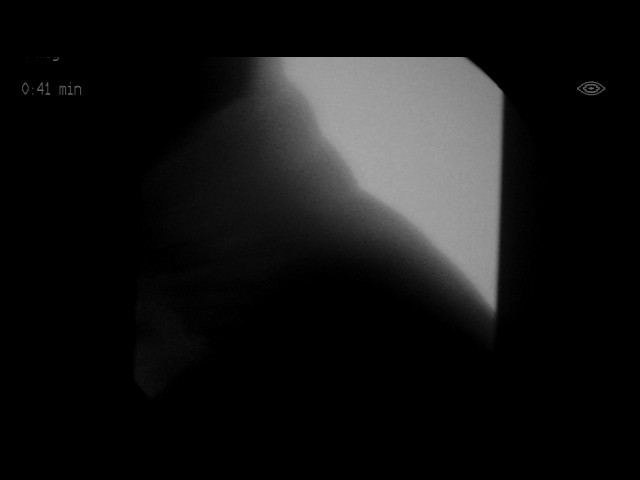
[im 9/24]
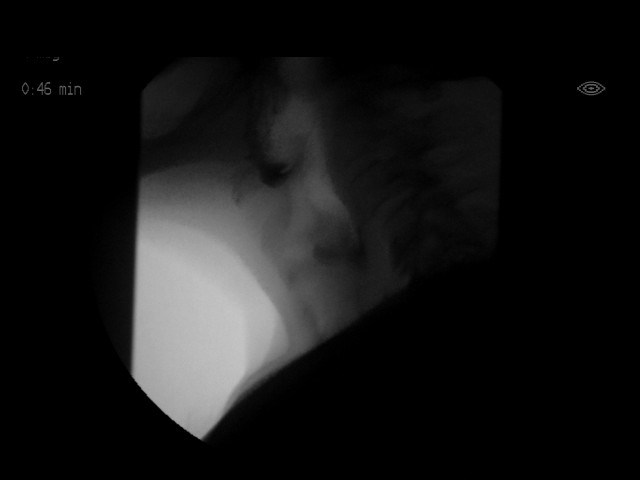
[im 11/24]
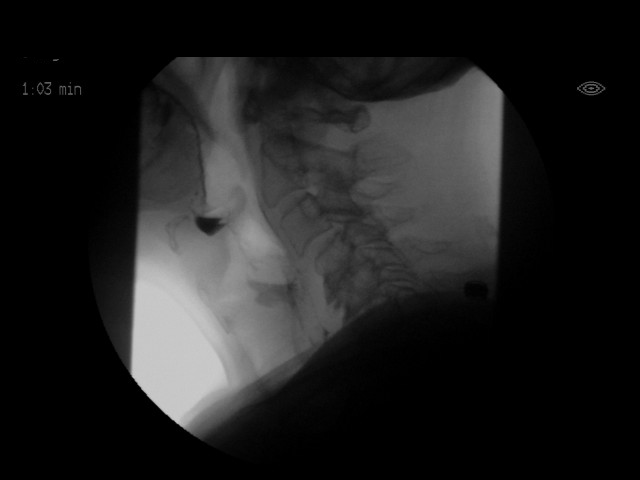
[im 12/24]
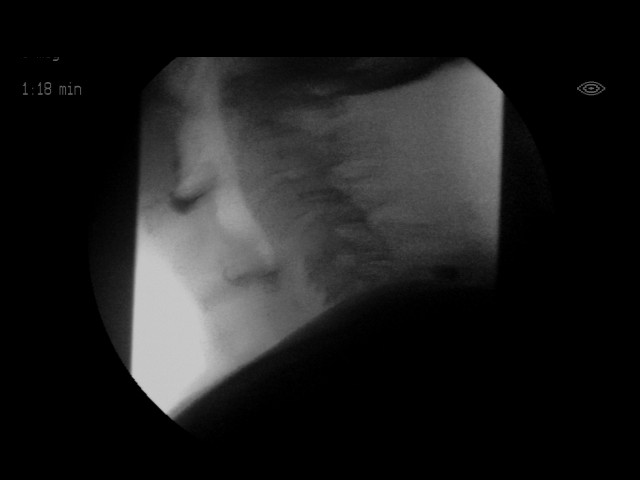
[im 13/24]
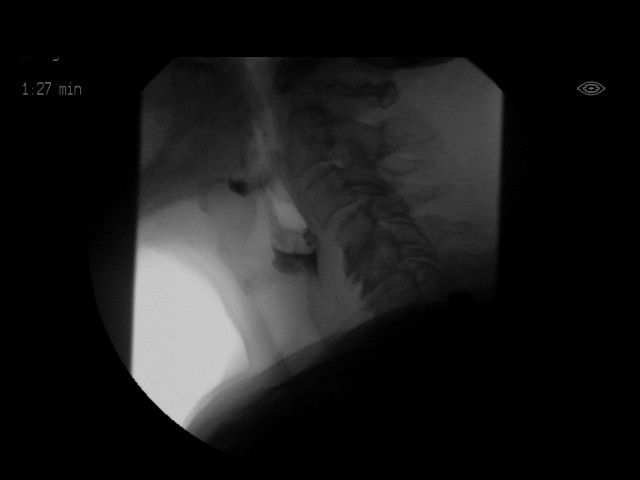
[im 15/24]
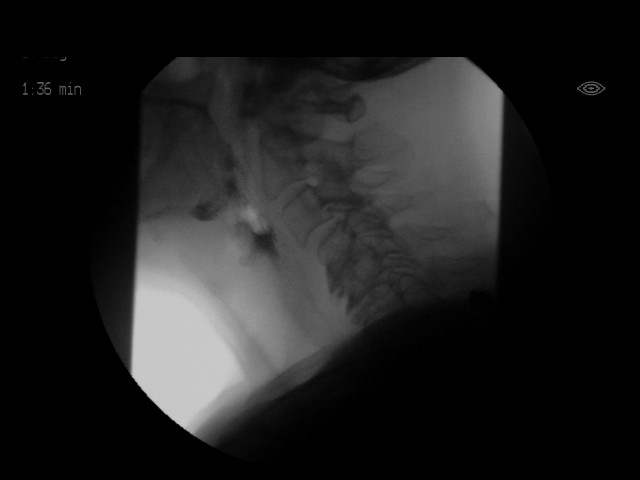
[im 16/24]
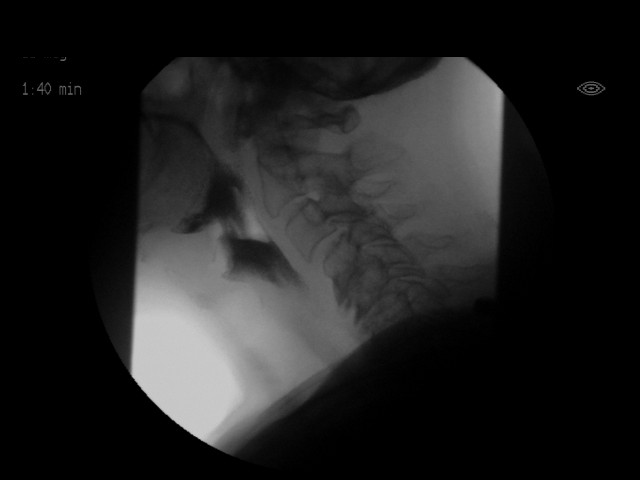
[im 17/24]
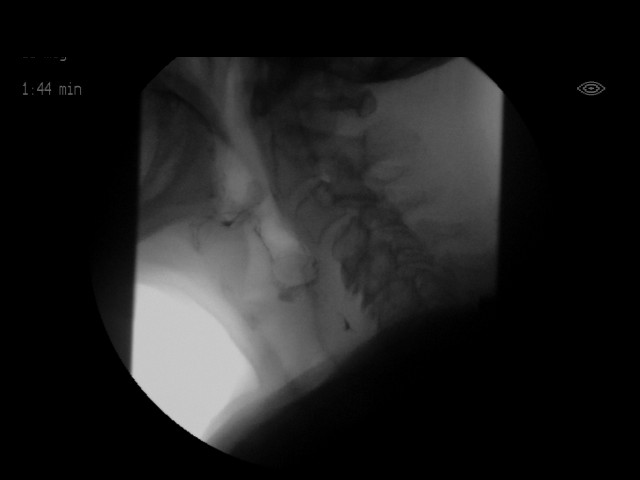
[im 19/24]
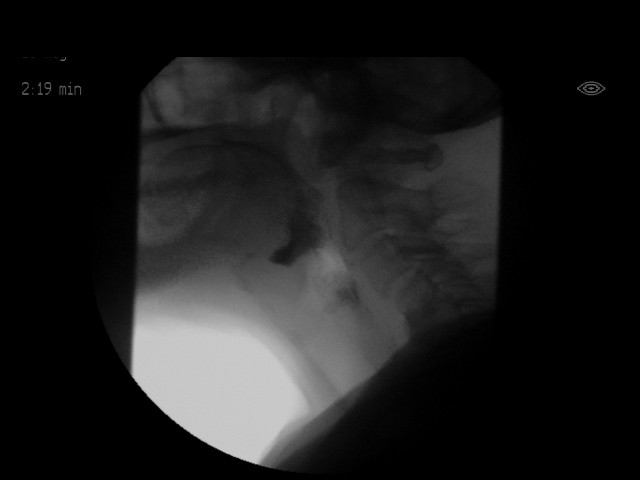
[im 20/24]
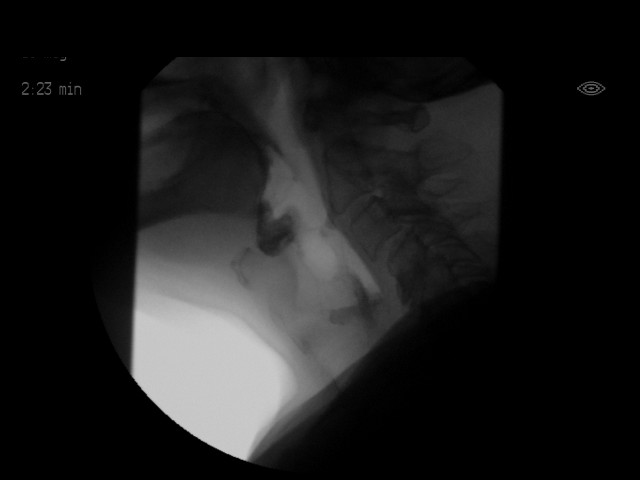
[im 21/24]
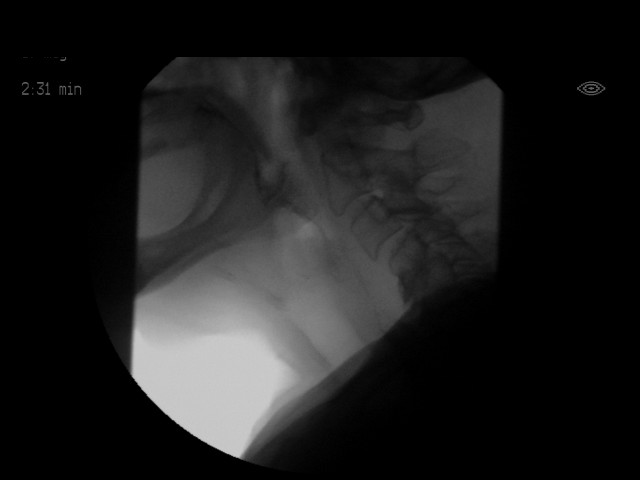
[im 23/24]
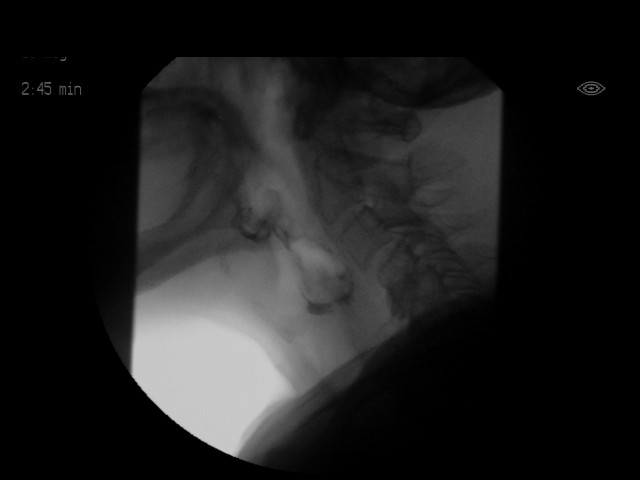
[im 24/24]
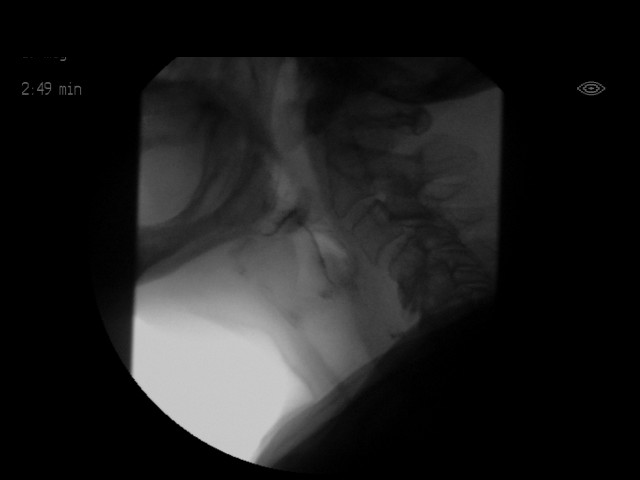

[18 of 24 positions shown; findings below may reference images not displayed]

FLUOROSCOPY FOR SWALLOWING FUNCTION STUDY:
Fluoroscopy was provided for swallowing function study, which was administered by a speech pathologist.  Final results and recommendations from this study are contained within the speech pathology report.

## 2017-03-06 IMAGING — CR DG CHEST 2V
2 series · 2 of 2 positions shown · non-contrast
Comparison: Chest x-ray dated [DATE].

CLINICAL DATA: Leukocytosis, hip surgery on [REDACTED]. History of
hypertension and CHF.

EXAM:
CHEST  2 VIEW

[chest lat]
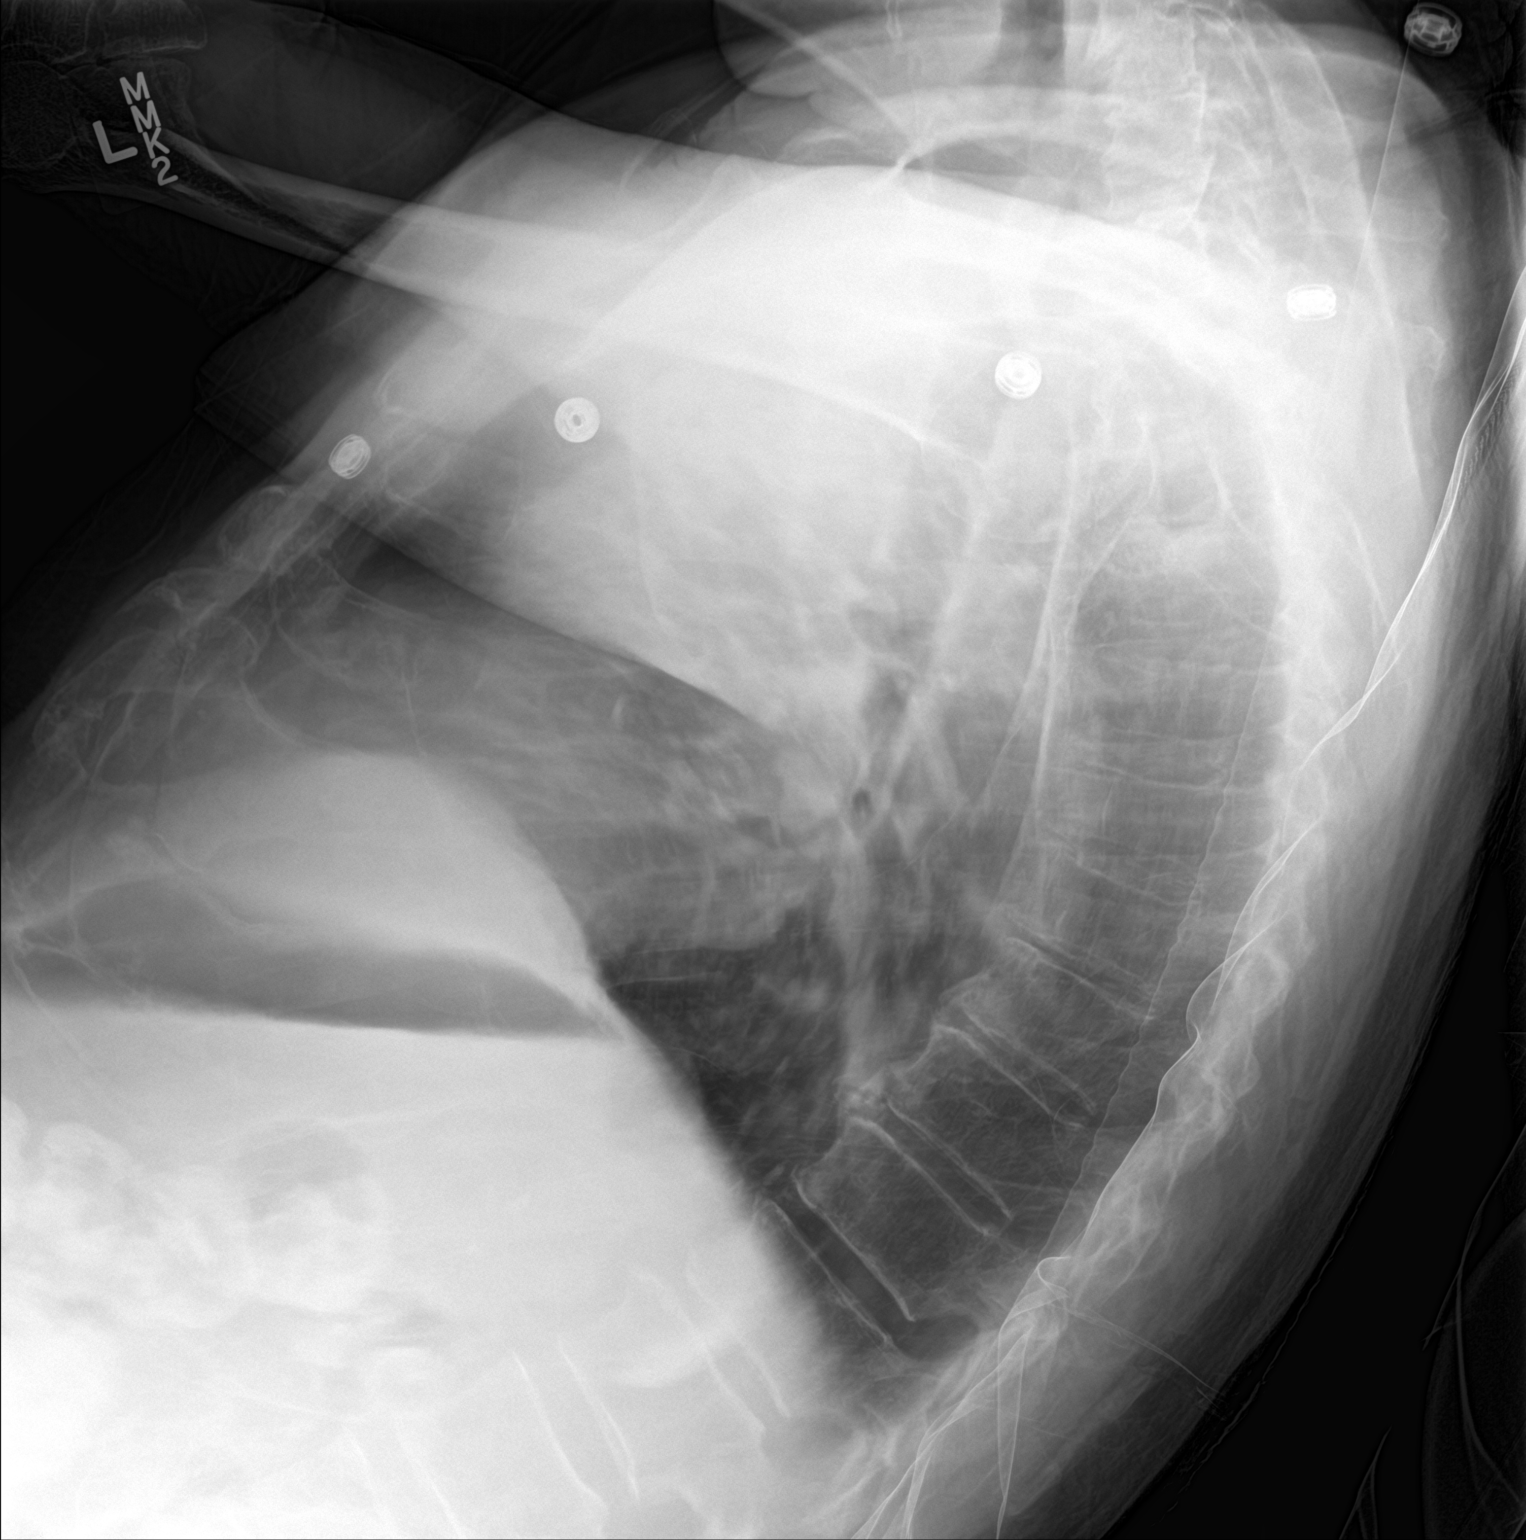

[chest ap]
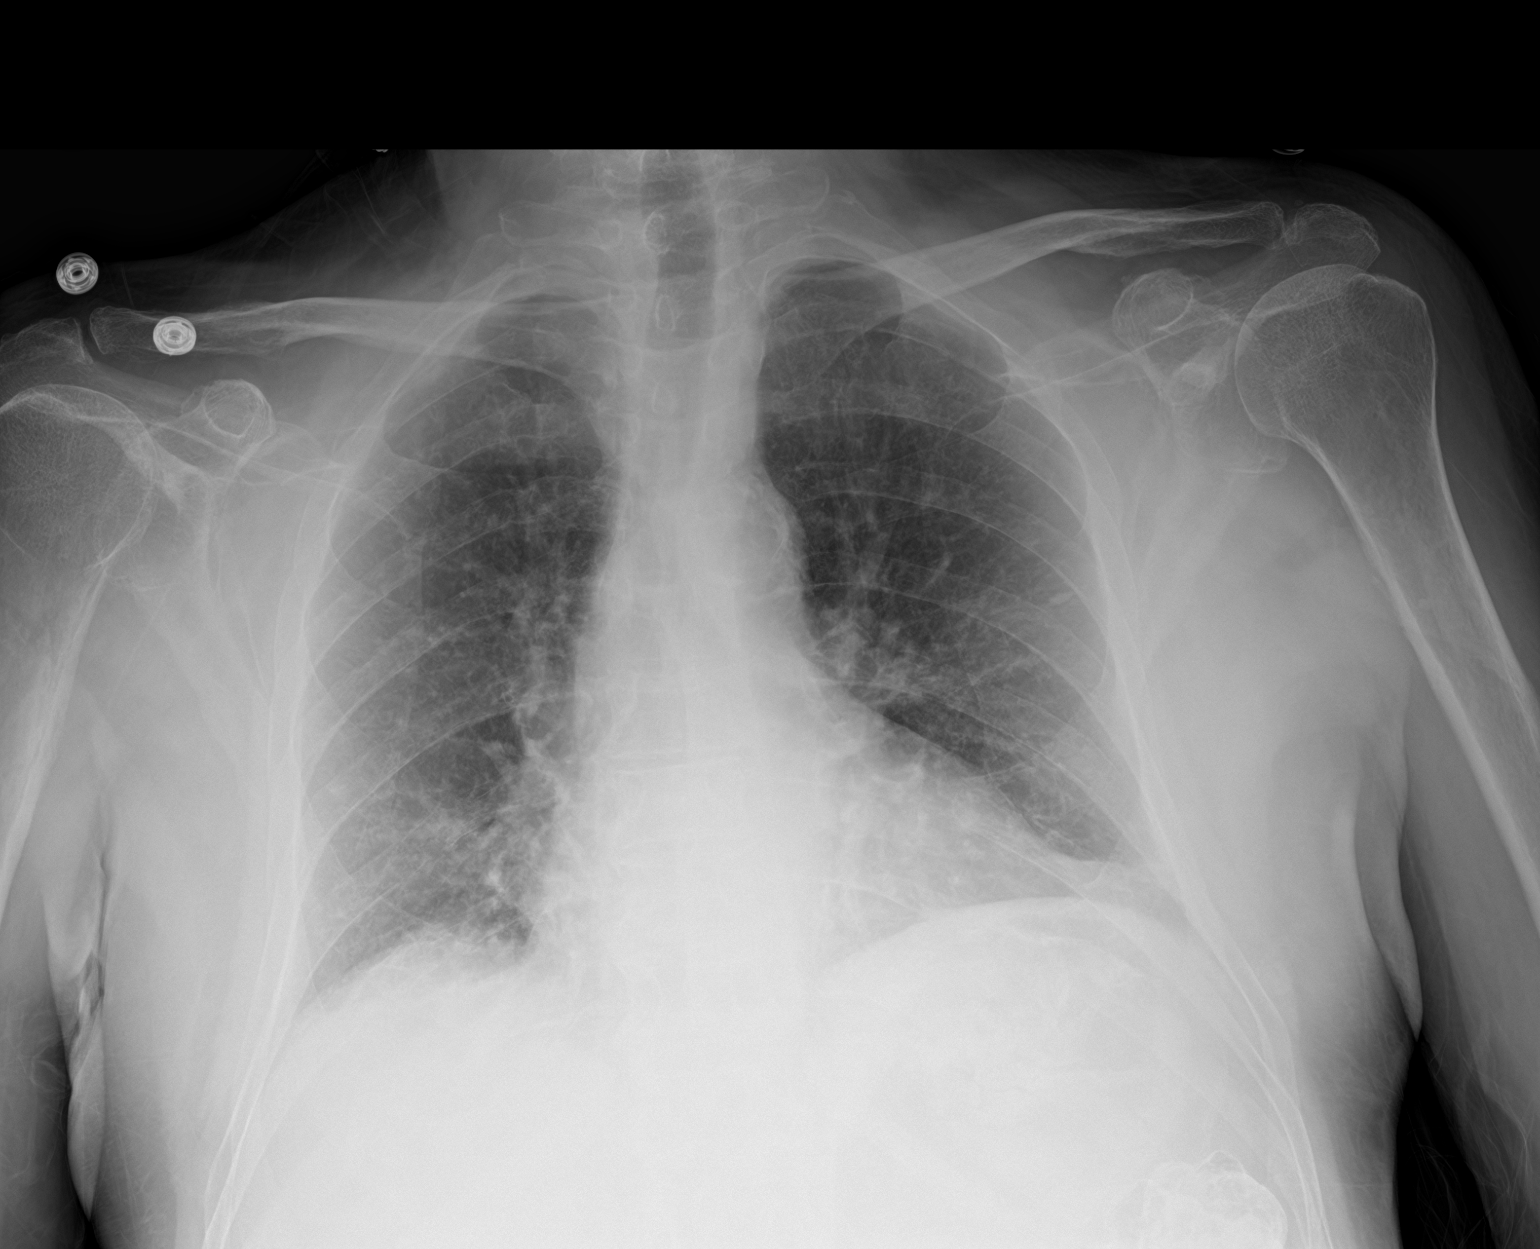

[2 of 2 positions shown; findings below may reference images not displayed]

FINDINGS: Heart size is upper normal, stable. Overall cardiomediastinal
silhouette is stable in size and configuration. Atherosclerotic
calcifications again noted at the aortic arch.

Coarse interstitial markings are again noted throughout both lungs,
slightly more prominent on today's exam due to lower lung volumes.
No new airspace opacity to suggest a developing pneumonia. No
pleural effusion or pneumothorax seen.

Mild degenerative spurring is seen throughout the slightly kyphotic
thoracic spine. No acute or suspicious osseous findings.
IMPRESSION: 1. No acute findings. No evidence of pneumonia. No pleural effusion.
2. Suspect some degree of chronic interstitial lung disease.
3. Aortic atherosclerosis.

## 2017-03-20 ENCOUNTER — Other Ambulatory Visit: Payer: Self-pay | Admitting: Family Medicine

## 2017-03-20 DIAGNOSIS — I10 Essential (primary) hypertension: Secondary | ICD-10-CM

## 2017-04-15 ENCOUNTER — Other Ambulatory Visit: Payer: Self-pay | Admitting: Family Medicine

## 2017-07-12 ENCOUNTER — Other Ambulatory Visit: Payer: Self-pay | Admitting: Family Medicine

## 2018-01-14 DIAGNOSIS — Z961 Presence of intraocular lens: Secondary | ICD-10-CM | POA: Diagnosis not present

## 2018-01-14 DIAGNOSIS — H524 Presbyopia: Secondary | ICD-10-CM | POA: Diagnosis not present

## 2018-02-27 ENCOUNTER — Other Ambulatory Visit: Payer: Self-pay | Admitting: Family Medicine

## 2018-02-27 DIAGNOSIS — I10 Essential (primary) hypertension: Secondary | ICD-10-CM

## 2018-02-28 ENCOUNTER — Other Ambulatory Visit: Payer: Self-pay | Admitting: Family Medicine

## 2018-03-02 ENCOUNTER — Ambulatory Visit (INDEPENDENT_AMBULATORY_CARE_PROVIDER_SITE_OTHER): Payer: Medicare HMO | Admitting: Family Medicine

## 2018-03-02 ENCOUNTER — Encounter: Payer: Self-pay | Admitting: Family Medicine

## 2018-03-02 VITALS — BP 126/74 | HR 66 | Temp 97.8°F | Resp 14 | Ht 64.0 in | Wt 166.0 lb

## 2018-03-02 DIAGNOSIS — Z8673 Personal history of transient ischemic attack (TIA), and cerebral infarction without residual deficits: Secondary | ICD-10-CM | POA: Diagnosis not present

## 2018-03-02 DIAGNOSIS — I6381 Other cerebral infarction due to occlusion or stenosis of small artery: Secondary | ICD-10-CM

## 2018-03-02 DIAGNOSIS — I10 Essential (primary) hypertension: Secondary | ICD-10-CM

## 2018-03-02 DIAGNOSIS — R413 Other amnesia: Secondary | ICD-10-CM

## 2018-03-02 DIAGNOSIS — E7800 Pure hypercholesterolemia, unspecified: Secondary | ICD-10-CM

## 2018-03-02 DIAGNOSIS — Z0000 Encounter for general adult medical examination without abnormal findings: Secondary | ICD-10-CM | POA: Diagnosis not present

## 2018-03-02 DIAGNOSIS — I5032 Chronic diastolic (congestive) heart failure: Secondary | ICD-10-CM

## 2018-03-02 LAB — COMPLETE METABOLIC PANEL WITH GFR
AG Ratio: 1.8 (calc) (ref 1.0–2.5)
ALBUMIN MSPROF: 4.5 g/dL (ref 3.6–5.1)
ALT: 16 U/L (ref 9–46)
AST: 18 U/L (ref 10–35)
Alkaline phosphatase (APISO): 62 U/L (ref 40–115)
BUN / CREAT RATIO: 30 (calc) — AB (ref 6–22)
BUN: 35 mg/dL — ABNORMAL HIGH (ref 7–25)
CALCIUM: 9.6 mg/dL (ref 8.6–10.3)
CO2: 26 mmol/L (ref 20–32)
CREATININE: 1.17 mg/dL — AB (ref 0.70–1.11)
Chloride: 104 mmol/L (ref 98–110)
GFR, EST NON AFRICAN AMERICAN: 55 mL/min/{1.73_m2} — AB (ref >=60)
GFR, Est African American: 64 mL/min/{1.73_m2} (ref >=60)
GLOBULIN: 2.5 g/dL (ref 1.9–3.7)
Glucose, Bld: 96 mg/dL (ref 65–99)
Potassium: 4.6 mmol/L (ref 3.5–5.3)
SODIUM: 140 mmol/L (ref 135–146)
TOTAL PROTEIN: 7 g/dL (ref 6.1–8.1)
Total Bilirubin: 0.6 mg/dL (ref 0.2–1.2)

## 2018-03-02 LAB — CBC WITH DIFFERENTIAL/PLATELET
Basophils Absolute: 42 cells/uL (ref 0–200)
Basophils Relative: 0.5 %
EOS PCT: 1.6 %
Eosinophils Absolute: 133 cells/uL (ref 15–500)
HEMATOCRIT: 38.8 % (ref 38.5–50.0)
HEMOGLOBIN: 13.3 g/dL (ref 13.2–17.1)
LYMPHS ABS: 2473 {cells}/uL (ref 850–3900)
MCH: 31.8 pg (ref 27.0–33.0)
MCHC: 34.3 g/dL (ref 32.0–36.0)
MCV: 92.8 fL (ref 80.0–100.0)
MPV: 11.1 fL (ref 7.5–12.5)
Monocytes Relative: 11 %
NEUTROS ABS: 4739 {cells}/uL (ref 1500–7800)
Neutrophils Relative %: 57.1 %
Platelets: 285 10*3/uL (ref 140–400)
RBC: 4.18 10*6/uL — AB (ref 4.20–5.80)
RDW: 12.1 % (ref 11.0–15.0)
Total Lymphocyte: 29.8 %
WBC: 8.3 10*3/uL (ref 3.8–10.8)
WBCMIX: 913 {cells}/uL (ref 200–950)

## 2018-03-02 LAB — LIPID PANEL
Cholesterol: 125 mg/dL (ref <200)
HDL: 44 mg/dL (ref >40)
LDL Cholesterol (Calc): 60 mg/dL (calc)
NON-HDL CHOLESTEROL (CALC): 81 mg/dL (ref <130)
Total CHOL/HDL Ratio: 2.8 (calc) (ref <5.0)
Triglycerides: 120 mg/dL (ref <150)

## 2018-03-02 MED ORDER — ASPIRIN 81 MG PO TABS: 81.0000 mg | ORAL_TABLET | Freq: Every day | ORAL | 0 refills | Status: DC

## 2018-03-02 NOTE — Progress Notes (Signed)
Subjective:    Patient ID: Eric Berger, male    DOB: [birth year], 84 y.o.   MRN: [id number]  HPI 11/2016 He is here today for follow-up of his multiple medical problems. He also has a question about driving.  Patient has a history of a CVA. He also recently fell and suffered a right hip fracture. From a muscular standpoint, he has good strength in his right leg. He has full flexion and extension of his ankle his knee and his hip with minimal pain. He is walking without a walker. His physical reaction time seems pretty good for his age. My concern is his memory. I performed a Mini-Mental status exam today. When I asked him what the date was, he told me 1988. He is only able to remember 2 out of 3 objects. He missed 2 letters in spelling world in reverse. He was unable to perform serial sevens. He had a difficult time on the mini cog/clock drawing drawing the correct time although the numbers were in the proper orientation and spacing. All this is suggestive of mild dementia. He denies any chest pain shortness of breath or dyspnea on exertion. He denies any myalgias or right upper quadrant pain.  At that time, my plan was: Blood pressure today is well controlled. I will check a fasting lipid panel. Goal LDL cholesterol is less than 70. Patient has early onset dementia and memory loss. At the present time I recommended that he avoid driving on highway, busy roads, driving at night, driving more than 45 miles per hour. I am still comfortable with him driving on local roads around his home there are 2 lane in the daytime. I explained this to the patient.  03/02/18 Patient has not been seen since but is here today for CPE.  Due to advanced age, he is not due for colon cancer screening or prostate cancer screening.  He appears to be due for Pneumovax 23.  Patient has not had a flu shot, Pneumovax 23, Prevnar 13.  I discussed this with him at length.  I have strongly recommended vaccination particularly  Pneumovax 23.  However the patient is wary of any vaccinations and politely declines.  Overall he states he is doing well.  He denies any significant pain.  He has a difficult time walking and is using a cane.  He requires assistance for him to get on the exam table.  He is a little unsteady as he walks without the cane.  With a cane he ambulates well.  He denies any recent falls or injuries.  He denies any depression however I do believe he is a high fall risk watching his unsteady gait.  Overall the remainder of his review of systems is negative Past Medical History:  Diagnosis Date  . Cataracts, bilateral   . Chronic diastolic CHF (congestive heart failure) (HCC)   . GERD (gastroesophageal reflux disease)   . Hearing loss   . Hernia   . High cholesterol   . Hypertension   . Renal disorder   . Stroke [facility])    Past Surgical History:  Procedure Laterality Date  . CHOLECYSTECTOMY    . EYE SURGERY    . HERNIA REPAIR    . HIP ARTHROPLASTY Right 04/05/2016   Procedure: RIGHT HIP HEMIARTHROPLASTY;  Surgeon: Sheral Apley, MD;  Location: [facility] OR;  Service: Orthopedics;  Laterality: Right;   Current Outpatient Medications on File Prior to Visit  Medication Sig Dispense Refill  . aspirin 325  MG tablet Take 325 mg by mouth daily.    . Cholecalciferol (VITAMIN D-3) 1000 units CAPS Take 1 capsule by mouth daily.    Marland Kitchen. GARLIC PO Take 1 tablet by mouth daily.    Marland Kitchen. glucosamine-chondroitin 500-400 MG tablet Take 1 tablet by mouth 2 (two) times daily.     . hydrochlorothiazide (HYDRODIURIL) 25 MG tablet TAKE 1 TABLET BY MOUTH EVERY DAY 90 tablet 3  . losartan (COZAAR) 50 MG tablet TAKE 1 TABLET BY MOUTH EVERY DAY 90 tablet 2  . Magnesium 250 MG TABS Take 1 tablet by mouth daily.    . Multiple Vitamin (MULTIVITAMIN) tablet Take 1 tablet by mouth daily.    . Saw Palmetto 450 MG CAPS Take 1 capsule by mouth daily.    . simvastatin (ZOCOR) 40 MG tablet TAKE ONE TABLET BY MOUTH ONCE DAILY AT 6PM 90 tablet  2   No current facility-administered medications on file prior to visit.    No Known Allergies Social History   Socioeconomic History  . Marital status: Married    Spouse name: Not on file  . Number of children: Not on file  . Years of education: Not on file  . Highest education level: Not on file  Occupational History  . Not on file  Social Needs  . Financial resource strain: Not on file  . Food insecurity:    Worry: Not on file    Inability: Not on file  . Transportation needs:    Medical: Not on file    Non-medical: Not on file  Tobacco Use  . Smoking status: Former Smoker    Last attempt to quit: 06/08/1962    Years since quitting: 55.7  . Smokeless tobacco: Never Used  Substance and Sexual Activity  . Alcohol use: No  . Drug use: No  . Sexual activity: Yes    Comment: Married.  Retired.  Lifestyle  . Physical activity:    Days per week: Not on file    Minutes per session: Not on file  . Stress: Not on file  Relationships  . Social connections:    Talks on phone: Not on file    Gets together: Not on file    Attends religious service: Not on file    Active member of club or organization: Not on file    Attends meetings of clubs or organizations: Not on file    Relationship status: Not on file  . Intimate partner violence:    Fear of current or ex partner: Not on file    Emotionally abused: Not on file    Physically abused: Not on file    Forced sexual activity: Not on file  Other Topics Concern  . Not on file  Social History Narrative  . Not on file     Review of Systems  All other systems reviewed and are negative.      Objective:   Physical Exam  Constitutional: He is oriented to person, place, and time. He appears well-developed and well-nourished. No distress.  HENT:  Head: Normocephalic and atraumatic.  Right Ear: External ear normal.  Left Ear: External ear normal.  Nose: Nose normal.  Mouth/Throat: Oropharynx is clear and moist. No  oropharyngeal exudate.  Eyes: Pupils are equal, round, and reactive to light. Conjunctivae and EOM are normal. Right eye exhibits no discharge. Left eye exhibits no discharge. No scleral icterus.  Neck: Normal range of motion. Neck supple. No JVD present. No tracheal deviation present. No thyromegaly  present.  Cardiovascular: Normal rate, regular rhythm and normal heart sounds. Exam reveals no gallop and no friction rub.  No murmur heard. Pulmonary/Chest: Effort normal and breath sounds normal. No stridor. No respiratory distress. He has no wheezes. He has no rales. He exhibits no tenderness.  Abdominal: Soft. Bowel sounds are normal. He exhibits no distension and no mass. There is no tenderness. There is no rebound and no guarding.  Musculoskeletal: He exhibits no edema, tenderness or deformity.  Lymphadenopathy:    He has no cervical adenopathy.  Neurological: He is alert and oriented to person, place, and time. He has normal reflexes. He displays normal reflexes. No cranial nerve deficit or sensory deficit. He exhibits normal muscle tone. Gait abnormal. Coordination normal.  Skin: Skin is warm. No rash noted. He is not diaphoretic. No erythema. No pallor.  Psychiatric: He has a normal mood and affect. His behavior is normal. Judgment and thought content normal.  Vitals reviewed.         Assessment & Plan:  General medical exam  Cerebrovascular accident (CVA) due to occlusion of small artery (HCC)  High cholesterol  History of stroke  Benign essential HTN  Chronic diastolic CHF (congestive heart failure) (HCC)  Memory loss  Patient is doing remarkably well at his age.  His memory loss seems stable.  He answers questions appropriately and ask appropriate follow-up questions all throughout the encounter.  He declines Pneumovax 23.  He declines Prevnar 13.Marland Kitchen  He is not due for prostate cancer screening or colon cancer screening given his advanced age.  His blood pressure today is  excellent.  I will check a CBC, CMP, fasting lipid panel.  I continue to encourage the patient ambulate with a cane which seems to steady his gait.  Continue to recommend relative driving restrictions given his mild memory impairment and delayed reaction time.  Encourage the patient to receive immunizations in case he changes his mind.

## 2018-04-01 ENCOUNTER — Other Ambulatory Visit: Payer: Self-pay | Admitting: Family Medicine

## 2018-06-04 ENCOUNTER — Other Ambulatory Visit: Payer: Self-pay | Admitting: Family Medicine

## 2018-06-04 MED ORDER — LOSARTAN POTASSIUM 100 MG PO TABS: 50.0000 mg | ORAL_TABLET | Freq: Every day | ORAL | 3 refills | Status: DC

## 2019-01-05 ENCOUNTER — Other Ambulatory Visit: Payer: Self-pay | Admitting: *Deleted

## 2019-01-05 MED ORDER — SIMVASTATIN 40 MG PO TABS: ORAL_TABLET | ORAL | 2 refills | Status: DC

## 2019-02-27 ENCOUNTER — Other Ambulatory Visit: Payer: Self-pay | Admitting: Family Medicine

## 2019-02-27 DIAGNOSIS — I10 Essential (primary) hypertension: Secondary | ICD-10-CM

## 2019-04-04 DIAGNOSIS — Z961 Presence of intraocular lens: Secondary | ICD-10-CM | POA: Diagnosis not present

## 2019-04-04 DIAGNOSIS — H524 Presbyopia: Secondary | ICD-10-CM | POA: Diagnosis not present

## 2019-05-29 ENCOUNTER — Other Ambulatory Visit: Payer: Self-pay | Admitting: Family Medicine

## 2019-06-06 ENCOUNTER — Ambulatory Visit (INDEPENDENT_AMBULATORY_CARE_PROVIDER_SITE_OTHER): Payer: Medicare HMO | Admitting: Family Medicine

## 2019-06-06 ENCOUNTER — Other Ambulatory Visit: Payer: Self-pay

## 2019-06-06 ENCOUNTER — Encounter: Payer: Self-pay | Admitting: Family Medicine

## 2019-06-06 VITALS — BP 118/62 | HR 70 | Temp 98.4°F | Resp 16 | Ht 64.0 in | Wt 148.0 lb

## 2019-06-06 DIAGNOSIS — Z0001 Encounter for general adult medical examination with abnormal findings: Secondary | ICD-10-CM

## 2019-06-06 DIAGNOSIS — E7800 Pure hypercholesterolemia, unspecified: Secondary | ICD-10-CM | POA: Diagnosis not present

## 2019-06-06 DIAGNOSIS — I6930 Unspecified sequelae of cerebral infarction: Secondary | ICD-10-CM

## 2019-06-06 DIAGNOSIS — R413 Other amnesia: Secondary | ICD-10-CM

## 2019-06-06 DIAGNOSIS — I6381 Other cerebral infarction due to occlusion or stenosis of small artery: Secondary | ICD-10-CM | POA: Diagnosis not present

## 2019-06-06 DIAGNOSIS — Z8673 Personal history of transient ischemic attack (TIA), and cerebral infarction without residual deficits: Secondary | ICD-10-CM

## 2019-06-06 DIAGNOSIS — I5032 Chronic diastolic (congestive) heart failure: Secondary | ICD-10-CM

## 2019-06-06 DIAGNOSIS — I10 Essential (primary) hypertension: Secondary | ICD-10-CM | POA: Diagnosis not present

## 2019-06-06 DIAGNOSIS — Z0000 Encounter for general adult medical examination without abnormal findings: Secondary | ICD-10-CM

## 2019-06-06 NOTE — Progress Notes (Signed)
Subjective:    Patient ID: Eric Berger, male    DOB: 1926-01-27, 85 y.o.   MRN: [id number]  HPI 11/2016 He is here today for follow-up of his multiple medical problems. He also has a question about driving.  Patient has a history of a CVA. He also recently fell and suffered a right hip fracture. From a muscular standpoint, he has good strength in his right leg. He has full flexion and extension of his ankle his knee and his hip with minimal pain. He is walking without a walker. His physical reaction time seems pretty good for his age. My concern is his memory. I performed a Mini-Mental status exam today. When I asked him what the date was, he told me 1988. He is only able to remember 2 out of 3 objects. He missed 2 letters in spelling world in reverse. He was unable to perform serial sevens. He had a difficult time on the mini cog/clock drawing drawing the correct time although the numbers were in the proper orientation and spacing. All this is suggestive of mild dementia. He denies any chest pain shortness of breath or dyspnea on exertion. He denies any myalgias or right upper quadrant pain.  At that time, my plan was: Blood pressure today is well controlled. I will check a fasting lipid panel. Goal LDL cholesterol is less than 70. Patient has early onset dementia and memory loss. At the present time I recommended that he avoid driving on highway, busy roads, driving at night, driving more than 45 miles per hour. I am still comfortable with him driving on local roads around his home there are 2 lane in the daytime. I explained this to the patient.  03/02/18 Patient has not been seen since but is here today for CPE.  Due to advanced age, he is not due for colon cancer screening or prostate cancer screening.  He appears to be due for Pneumovax 23.  Patient has not had a flu shot, Pneumovax 23, Prevnar 13.  I discussed this with him at length.  I have strongly recommended vaccination particularly  Pneumovax 23.  However the patient is wary of any vaccinations and politely declines.  Overall he states he is doing well.  He denies any significant pain.  He has a difficult time walking and is using a cane.  He requires assistance for him to get on the exam table.  He is a little unsteady as he walks without the cane.  With a cane he ambulates well.  He denies any recent falls or injuries.  He denies any depression however I do believe he is a high fall risk watching his unsteady gait.  Overall the remainder of his review of systems is negative.  At that time, my plan was: Patient is doing remarkably well at his age.  His memory loss seems stable.  He answers questions appropriately and ask appropriate follow-up questions all throughout the encounter.  He declines Pneumovax 23.  He declines Prevnar 13.Marland Kitchen  He is not due for prostate cancer screening or colon cancer screening given his advanced age.  His blood pressure today is excellent.  I will check a CBC, CMP, fasting lipid panel.  I continue to encourage the patient ambulate with a cane which seems to steady his gait.  Continue to recommend relative driving restrictions given his mild memory impairment and delayed reaction time.  Encourage the patient to receive immunizations in case he changes his mind.  06/06/19 Patient is  here today for complete physical exam.  He denies any concerns.  I performed a Mini-Mental status exam.  Patient is able to remember 2 out of 3 objects on recall.  He has a difficult time with serial sevens.  I asked him to draw clock and have it show 345.  He was only able to put 5 numbers on the face of the clock and they were improperly spaced.  He then drew a clock that had 3 hands on it.  There was a hand pointed to 12, hand pointed to 3, and hand pointed to 5.  Therefore clock drawing was incorrect.  He does report some mild short-term memory loss however at the present time it is mild and not interfering with his quality of life.   He is not driving.  His daughter drove him to his appointment today.  He denies any falls in the last year although he does have a history of a fall that fractured his hip.  He is walking with a cane.  Patient has a difficult time standing from a seated position without holding onto something.  He also has a very slow unsteady gait even with a cane putting him at high fall risk.  Depression screen is normal.  Given his advanced age, the patient does not require prostate cancer screening or colon cancer screening.  He adamantly refuses a pneumonia vaccine and a flu shot  Past Medical History:  Diagnosis Date  . Cataracts, bilateral   . Chronic diastolic CHF (congestive heart failure) (HCC)   . GERD (gastroesophageal reflux disease)   . Hearing loss   . Hernia   . High cholesterol   . Hypertension   . Renal disorder   . Stroke [facility](HCC)    Past Surgical History:  Procedure Laterality Date  . CHOLECYSTECTOMY    . EYE SURGERY    . HERNIA REPAIR    . HIP ARTHROPLASTY Right 04/05/2016   Procedure: RIGHT HIP HEMIARTHROPLASTY;  Surgeon: Sheral Apleyimothy D Murphy, MD;  Location: [facility] OR;  Service: Orthopedics;  Laterality: Right;   Current Outpatient Medications on File Prior to Visit  Medication Sig Dispense Refill  . aspirin 81 MG tablet Take 1 tablet (81 mg total) by mouth daily. 30 tablet 0  . Cholecalciferol (VITAMIN D-3) 1000 units CAPS Take 1 capsule by mouth daily.    Marland Kitchen. GARLIC PO Take 1 tablet by mouth daily.    Marland Kitchen. glucosamine-chondroitin 500-400 MG tablet Take 1 tablet by mouth 2 (two) times daily.     . hydrochlorothiazide (HYDRODIURIL) 25 MG tablet TAKE 1 TABLET BY MOUTH EVERY DAY 90 tablet 3  . losartan (COZAAR) 100 MG tablet TAKE 1/2 TABLET BY MOUTH DAILY 45 tablet 3  . Magnesium 250 MG TABS Take 1 tablet by mouth daily.    . Multiple Vitamin (MULTIVITAMIN) tablet Take 1 tablet by mouth daily.    . Saw Palmetto 450 MG CAPS Take 1 capsule by mouth daily.    . simvastatin (ZOCOR) 40 MG tablet TAKE  ONE TABLET BY MOUTH ONCE DAILY AT 6PM. Requires office visit before any further refills can be given. 90 tablet 2   No current facility-administered medications on file prior to visit.    No Known Allergies Social History   Socioeconomic History  . Marital status: Married    Spouse name: Not on file  . Number of children: Not on file  . Years of education: Not on file  . Highest education level: Not on file  Occupational History  . Not on file  Social Needs  . Financial resource strain: Not on file  . Food insecurity    Worry: Not on file    Inability: Not on file  . Transportation needs    Medical: Not on file    Non-medical: Not on file  Tobacco Use  . Smoking status: Former Smoker    Quit date: 06/08/1962    Years since quitting: 57.0  . Smokeless tobacco: Never Used  Substance and Sexual Activity  . Alcohol use: No  . Drug use: No  . Sexual activity: Yes    Comment: Married.  Retired.  Lifestyle  . Physical activity    Days per week: Not on file    Minutes per session: Not on file  . Stress: Not on file  Relationships  . Social Musicianconnections    Talks on phone: Not on file    Gets together: Not on file    Attends religious service: Not on file    Active member of club or organization: Not on file    Attends meetings of clubs or organizations: Not on file    Relationship status: Not on file  . Intimate partner violence    Fear of current or ex partner: Not on file    Emotionally abused: Not on file    Physically abused: Not on file    Forced sexual activity: Not on file  Other Topics Concern  . Not on file  Social History Narrative  . Not on file     Review of Systems  All other systems reviewed and are negative.      Objective:   Physical Exam  Constitutional: He is oriented to person, place, and time. He appears well-developed and well-nourished. No distress.  HENT:  Head: Normocephalic and atraumatic.  Right Ear: External ear normal.  Left Ear:  External ear normal.  Nose: Nose normal.  Mouth/Throat: Oropharynx is clear and moist. No oropharyngeal exudate.  Eyes: Pupils are equal, round, and reactive to light. Conjunctivae and EOM are normal. Right eye exhibits no discharge. Left eye exhibits no discharge. No scleral icterus.  Neck: Normal range of motion. Neck supple. No JVD present. No tracheal deviation present. No thyromegaly present.  Cardiovascular: Normal rate, regular rhythm and normal heart sounds. Exam reveals no gallop and no friction rub.  No murmur heard. Pulmonary/Chest: Effort normal and breath sounds normal. No stridor. No respiratory distress. He has no wheezes. He has no rales. He exhibits no tenderness.  Abdominal: Soft. Bowel sounds are normal. He exhibits no distension and no mass. There is no abdominal tenderness. There is no rebound and no guarding.  Musculoskeletal:        General: No tenderness, deformity or edema.  Lymphadenopathy:    He has no cervical adenopathy.  Neurological: He is alert and oriented to person, place, and time. He has normal reflexes. No cranial nerve deficit or sensory deficit. He exhibits normal muscle tone. Gait abnormal. Coordination normal.  Skin: Skin is warm. No rash noted. He is not diaphoretic. No erythema. No pallor.  Psychiatric: He has a normal mood and affect. His behavior is normal. Judgment and thought content normal.  Vitals reviewed.         Assessment & Plan:  General medical exam  Cerebrovascular accident (CVA) due to occlusion of small artery (HCC) - Plan: CBC with Differential/Platelet, COMPLETE METABOLIC PANEL WITH GFR, Lipid panel  Benign essential HTN - Plan: CBC with Differential/Platelet, COMPLETE METABOLIC  PANEL WITH GFR, Lipid panel  Chronic diastolic CHF (congestive heart failure) (HCC)  History of stroke - Plan: CBC with Differential/Platelet, COMPLETE METABOLIC PANEL WITH GFR, Lipid panel  Memory loss  Pure hypercholesterolemia - Plan: CBC with  Differential/Platelet, COMPLETE METABOLIC PANEL WITH GFR, Lipid panel  Blood pressure today is well controlled.  Given his history of stroke I will check a CMP and a fasting lipid panel.  Ideally I like his LDL cholesterol below 70.  Check a CBC to monitor for anemia or myelodysplasia.  Check a CMP to monitor kidney function and liver function test.  Patient denies any shortness of breath or chest pain.  He does have some mild short-term memory loss at the present time however it is stable.  I have recommended that he not drive.  Patient is at high risk for falls.  He is using a cane to ambulate.  Overall he does demonstrate deconditioning consistent with his age.  He has lost substantial weight since his last appointment.  I recommended that he drink 1 can of Ensure or boost at least per day to try to prevent protein calorie malnutrition.  Regular anticipatory guidance is provided.

## 2019-06-07 LAB — CBC WITH DIFFERENTIAL/PLATELET
Absolute Monocytes: 631 cells/uL (ref 200–950)
Basophils Absolute: 59 cells/uL (ref 0–200)
Basophils Relative: 0.9 %
Eosinophils Absolute: 150 cells/uL (ref 15–500)
Eosinophils Relative: 2.3 %
HCT: 39.5 % (ref 38.5–50.0)
Hemoglobin: 13.3 g/dL (ref 13.2–17.1)
Lymphs Abs: 2061 cells/uL (ref 850–3900)
MCH: 32.3 pg (ref 27.0–33.0)
MCHC: 33.7 g/dL (ref 32.0–36.0)
MCV: 95.9 fL (ref 80.0–100.0)
MPV: 11.3 fL (ref 7.5–12.5)
Monocytes Relative: 9.7 %
Neutro Abs: 3601 cells/uL (ref 1500–7800)
Neutrophils Relative %: 55.4 %
Platelets: 269 10*3/uL (ref 140–400)
RBC: 4.12 10*6/uL — ABNORMAL LOW (ref 4.20–5.80)
RDW: 12.4 % (ref 11.0–15.0)
Total Lymphocyte: 31.7 %
WBC: 6.5 10*3/uL (ref 3.8–10.8)

## 2019-06-07 LAB — COMPLETE METABOLIC PANEL WITH GFR
AG Ratio: 1.7 (calc) (ref 1.0–2.5)
ALT: 15 U/L (ref 9–46)
AST: 22 U/L (ref 10–35)
Albumin: 4.5 g/dL (ref 3.6–5.1)
Alkaline phosphatase (APISO): 53 U/L (ref 35–144)
BUN/Creatinine Ratio: 28 (calc) — ABNORMAL HIGH (ref 6–22)
BUN: 30 mg/dL — ABNORMAL HIGH (ref 7–25)
CO2: 25 mmol/L (ref 20–32)
Calcium: 9.9 mg/dL (ref 8.6–10.3)
Chloride: 103 mmol/L (ref 98–110)
Creat: 1.09 mg/dL (ref 0.70–1.11)
GFR, Est African American: 68 mL/min/{1.73_m2} (ref >=60)
GFR, Est Non African American: 59 mL/min/{1.73_m2} — ABNORMAL LOW (ref >=60)
Globulin: 2.6 g/dL (calc) (ref 1.9–3.7)
Glucose, Bld: 102 mg/dL — ABNORMAL HIGH (ref 65–99)
Potassium: 4.8 mmol/L (ref 3.5–5.3)
Sodium: 140 mmol/L (ref 135–146)
Total Bilirubin: 0.7 mg/dL (ref 0.2–1.2)
Total Protein: 7.1 g/dL (ref 6.1–8.1)

## 2019-06-07 LAB — LIPID PANEL
Cholesterol: 146 mg/dL (ref <200)
HDL: 45 mg/dL (ref >=40)
LDL Cholesterol (Calc): 77 mg/dL (calc)
Non-HDL Cholesterol (Calc): 101 mg/dL (calc) (ref <130)
Total CHOL/HDL Ratio: 3.2 (calc) (ref <5.0)
Triglycerides: 144 mg/dL (ref <150)

## 2019-12-05 ENCOUNTER — Other Ambulatory Visit: Payer: Self-pay | Admitting: Family Medicine

## 2019-12-05 DIAGNOSIS — I10 Essential (primary) hypertension: Secondary | ICD-10-CM

## 2020-01-13 ENCOUNTER — Emergency Department (HOSPITAL_COMMUNITY): Payer: Medicare HMO

## 2020-01-13 ENCOUNTER — Emergency Department (HOSPITAL_COMMUNITY)
Admission: EM | Admit: 2020-01-13 | Discharge: 2020-01-14 | Disposition: A | Discharge status: Home / Self Care | Payer: Medicare HMO | Attending: Emergency Medicine | Admitting: Emergency Medicine

## 2020-01-13 ENCOUNTER — Other Ambulatory Visit: Payer: Self-pay

## 2020-01-13 ENCOUNTER — Encounter (HOSPITAL_COMMUNITY): Payer: Self-pay | Admitting: Emergency Medicine

## 2020-01-13 DIAGNOSIS — Y9389 Activity, other specified: Secondary | ICD-10-CM | POA: Diagnosis not present

## 2020-01-13 DIAGNOSIS — I10 Essential (primary) hypertension: Secondary | ICD-10-CM | POA: Diagnosis not present

## 2020-01-13 DIAGNOSIS — W06XXXA Fall from bed, initial encounter: Secondary | ICD-10-CM | POA: Insufficient documentation

## 2020-01-13 DIAGNOSIS — I5032 Chronic diastolic (congestive) heart failure: Secondary | ICD-10-CM | POA: Insufficient documentation

## 2020-01-13 DIAGNOSIS — Z87891 Personal history of nicotine dependence: Secondary | ICD-10-CM | POA: Diagnosis not present

## 2020-01-13 DIAGNOSIS — S299XXA Unspecified injury of thorax, initial encounter: Secondary | ICD-10-CM | POA: Diagnosis not present

## 2020-01-13 DIAGNOSIS — Y92013 Bedroom of single-family (private) house as the place of occurrence of the external cause: Secondary | ICD-10-CM | POA: Insufficient documentation

## 2020-01-13 DIAGNOSIS — M545 Low back pain: Secondary | ICD-10-CM | POA: Diagnosis not present

## 2020-01-13 DIAGNOSIS — S79912A Unspecified injury of left hip, initial encounter: Secondary | ICD-10-CM | POA: Diagnosis not present

## 2020-01-13 DIAGNOSIS — I110 Hypertensive heart disease with heart failure: Secondary | ICD-10-CM | POA: Insufficient documentation

## 2020-01-13 DIAGNOSIS — Z79899 Other long term (current) drug therapy: Secondary | ICD-10-CM | POA: Insufficient documentation

## 2020-01-13 DIAGNOSIS — S32010A Wedge compression fracture of first lumbar vertebra, initial encounter for closed fracture: Secondary | ICD-10-CM | POA: Insufficient documentation

## 2020-01-13 DIAGNOSIS — Y999 Unspecified external cause status: Secondary | ICD-10-CM | POA: Diagnosis not present

## 2020-01-13 DIAGNOSIS — W19XXXA Unspecified fall, initial encounter: Secondary | ICD-10-CM

## 2020-01-13 DIAGNOSIS — S3992XA Unspecified injury of lower back, initial encounter: Secondary | ICD-10-CM | POA: Diagnosis not present

## 2020-01-13 DIAGNOSIS — R079 Chest pain, unspecified: Secondary | ICD-10-CM | POA: Diagnosis not present

## 2020-01-13 DIAGNOSIS — S79911A Unspecified injury of right hip, initial encounter: Secondary | ICD-10-CM | POA: Diagnosis not present

## 2020-01-13 LAB — CBC
HCT: 39.2 % (ref 39.0–52.0)
Hemoglobin: 13.2 g/dL (ref 13.0–17.0)
MCH: 32.1 pg (ref 26.0–34.0)
MCHC: 33.7 g/dL (ref 30.0–36.0)
MCV: 95.4 fL (ref 80.0–100.0)
Platelets: 222 10*3/uL (ref 150–400)
RBC: 4.11 MIL/uL — ABNORMAL LOW (ref 4.22–5.81)
RDW: 12.5 % (ref 11.5–15.5)
WBC: 16.8 10*3/uL — ABNORMAL HIGH (ref 4.0–10.5)
nRBC: 0 % (ref 0.0–0.2)

## 2020-01-13 LAB — URINALYSIS, ROUTINE W REFLEX MICROSCOPIC
Bilirubin Urine: NEGATIVE
Glucose, UA: NEGATIVE mg/dL
Hgb urine dipstick: NEGATIVE
Ketones, ur: 5 mg/dL — AB
Leukocytes,Ua: NEGATIVE
Nitrite: NEGATIVE
Protein, ur: NEGATIVE mg/dL
Specific Gravity, Urine: 1.019 (ref 1.005–1.030)
pH: 5 (ref 5.0–8.0)

## 2020-01-13 LAB — BASIC METABOLIC PANEL
Anion gap: 13 (ref 5–15)
BUN: 31 mg/dL — ABNORMAL HIGH (ref 8–23)
CO2: 23 mmol/L (ref 22–32)
Calcium: 9.4 mg/dL (ref 8.9–10.3)
Chloride: 102 mmol/L (ref 98–111)
Creatinine, Ser: 1.49 mg/dL — ABNORMAL HIGH (ref 0.61–1.24)
GFR calc Af Amer: 47 mL/min — ABNORMAL LOW (ref >60)
GFR calc non Af Amer: 40 mL/min — ABNORMAL LOW (ref >60)
Glucose, Bld: 144 mg/dL — ABNORMAL HIGH (ref 70–99)
Potassium: 4.2 mmol/L (ref 3.5–5.1)
Sodium: 138 mmol/L (ref 135–145)

## 2020-01-13 LAB — CBG MONITORING, ED: Glucose-Capillary: 112 mg/dL — ABNORMAL HIGH (ref 70–99)

## 2020-01-13 MED ORDER — SODIUM CHLORIDE 0.9% FLUSH
3.0000 mL | Freq: Once | INTRAVENOUS | Status: AC
  Administered 2020-01-13: 3 mL via INTRAVENOUS

## 2020-01-13 MED ORDER — ACETAMINOPHEN 325 MG PO TABS
650.0000 mg | ORAL_TABLET | Freq: Once | ORAL | Status: AC
  Administered 2020-01-13: 650 mg via ORAL
  Filled 2020-01-13: qty 2

## 2020-01-13 NOTE — ED Notes (Signed)
Pt able to ambulate with walker in hallway, pt son states appears baseline during this activity

## 2020-01-13 NOTE — ED Notes (Signed)
Pt offered beverage with PO tylenol, able to tolerate PO fluids with ease.

## 2020-01-13 NOTE — ED Notes (Signed)
Transported to xray 

## 2020-01-13 NOTE — ED Provider Notes (Signed)
[facility] EMERGENCY DEPARTMENT Provider Note   CSN: [id number] Arrival date & time: 01/13/20  1648     History Chief Complaint  Patient presents with  . Fall    Eric Berger is a 86 y.o. male.  HPI Patient is a 86 year old male with a past medical history of stroke, hernia, hearing loss, cataracts, CHF, HLD, GERD, hypertension.  Patient presents with son at bedside.  Patient states that he fell out of his bed at approximately 1:30 AM this morning.  He has had multiple falls recently and patient states that he did not hit his head or lose consciousness.  He states he did not feel dizzy or lightheaded before falling.  He states that he slipped on the sheets and fell onto his butt.  He states he has some low back pain which is unchanged from it was yesterday.  He also states that he has a history of right hip fracture several years ago however states that he had no pain with fracture.  Denies surgery.  States he has no pain subsequently however symptoms concern for this.  Patient denies any headache, dizziness, lightheadedness, chest pain, shortness of breath, abdominal pain, nausea, vomiting, weakness, fatigue.  Denies any fevers or chills or cough.  Denies any urinary complaints.    Past Medical History:  Diagnosis Date  . Cataracts, bilateral   . Chronic diastolic CHF (congestive heart failure) (HCC)   . GERD (gastroesophageal reflux disease)   . Hearing loss   . Hernia   . High cholesterol   . Hypertension   . Renal disorder   . Stroke [facility])     Patient Active Problem List   Diagnosis Date Noted  . Closed right hip fracture (HCC) 04/04/2016  . Nausea & vomiting 04/04/2016  . Stroke (HCC)   . High cholesterol   . Chronic diastolic CHF (congestive heart failure) (HCC)   . Dysphagia 11/23/2015  . Mitral regurgitation 09/29/2015  . CAP (community acquired pneumonia) 09/23/2015  . ARF (acute renal failure) (HCC) 09/23/2015  . HTN (hypertension)  11/07/2014  . Encounter for long-term (current) use of other high-risk medications 11/07/2014  . G E R D 12/02/2007  . ASPIRATION FOREIGN BODY 12/02/2007    Past Surgical History:  Procedure Laterality Date  . CHOLECYSTECTOMY    . EYE SURGERY    . HERNIA REPAIR    . HIP ARTHROPLASTY Right 04/05/2016   Procedure: RIGHT HIP HEMIARTHROPLASTY;  Surgeon: Sheral Apley, MD;  Location: [facility] OR;  Service: Orthopedics;  Laterality: Right;       Family History  Problem Relation Age of Onset  . Diabetes Mellitus II Brother     Social History   Tobacco Use  . Smoking status: Former Smoker    Quit date: 06/08/1962    Years since quitting: 57.6  . Smokeless tobacco: Never Used  Substance Use Topics  . Alcohol use: No  . Drug use: No    Home Medications Prior to Admission medications   Medication Sig Start Date End Date Taking? Authorizing Provider  Apoaequorin (PREVAGEN EXTRA STRENGTH PO) Take 1 tablet by mouth daily.   Yes [provider]  aspirin EC 325 MG tablet Take 325 mg by mouth daily.   Yes [provider]  hydrochlorothiazide (HYDRODIURIL) 25 MG tablet TAKE 1 TABLET BY MOUTH EVERY DAY Patient taking differently: Take 25 mg by mouth daily.  12/05/19  Yes Donita Brooks, MD  losartan (COZAAR) 100 MG tablet TAKE 1/2  TABLET BY MOUTH DAILY Patient taking differently: Take 50 mg by mouth daily.  05/31/19  Yes Donita Brooks, MD  Multiple Vitamin (MULTIVITAMIN WITH MINERALS) TABS tablet Take 1 tablet by mouth daily. Centrum Silver   Yes [provider]  Saw Palmetto 450 MG CAPS Take 450 mg by mouth daily.    Yes [provider]  simvastatin (ZOCOR) 40 MG tablet 1 TABLET BY MOUTH ONCE DAILY AT 6PM. REQUIRES OFFICE VISIT BEFORE ANY FURTHER REFILLS CAN BE GIVEN Patient taking differently: Take 40 mg by mouth daily.  12/05/19  Yes Donita Brooks, MD  aspirin 81 MG tablet Take 1 tablet (81 mg total) by mouth daily. Patient not taking: Reported on  01/13/2020 03/02/18   Donita Brooks, MD    Allergies    Patient has no known allergies.  Review of Systems   Review of Systems  Constitutional: Negative for chills and fever.  HENT: Negative for congestion.   Eyes: Negative for pain.  Respiratory: Negative for cough and shortness of breath.   Cardiovascular: Negative for chest pain and leg swelling.  Gastrointestinal: Negative for abdominal pain and vomiting.  Genitourinary: Negative for dysuria.  Musculoskeletal: Positive for back pain. Negative for myalgias.  Skin: Negative for rash.  Neurological: Negative for dizziness and headaches.    Physical Exam Updated Vital Signs BP (!) 129/58   Pulse 83   Temp 98 F (36.7 C) (Oral)   Resp (!) 23   Ht 5\' 5"  (1.651 m)   Wt 63.5 kg   SpO2 99%   BMI 23.30 kg/m    CONSTITUTIONAL:  well-appearing, NAD NEURO:  Alert and oriented x 3, no focal deficits EYES:  pupils equal and reactive ENT/NECK:  trachea midline, no JVD CARDIO:  reg rate, reg rhythm, well-perfused PULM:  None labored breathing GI/GU:  Abdomen non-distended MSK/SPINE:  No gross deformities, no edema. No midline TTP of back.  No tenderness palpation of C-spine, midline thoracic lumbar spine or hips.  Degenerative elevation of upper or lower extremities.  No chest wall or abdominal tenderness.  No facial redness palpation.  Head is atraumatic. SKIN:  no rash obvious, atraumatic, no ecchymosis  PSYCH:  Appropriate speech and behavior NEURO: Alert and oriented to self, place, time and event.   Speech is fluent, clear without dysarthria or dysphasia.   Strength 5/5 in upper/lower extremities   Sensation intact in upper/lower extremities   Normal gait.  Negative Romberg. No pronator drift.  Normal finger-to-nose and feet tapping.  CN I not tested  CN II grossly intact visual fields bilaterally. Did not visualize posterior eye.  CN III, IV, VI PERRLA and EOMs intact bilaterally  CN V Intact sensation to sharp and  light touch to the face  CN VII facial movements symmetric  CN VIII not tested  CN IX, X no uvula deviation, symmetric rise of soft palate  CN XI 5/5 SCM and trapezius strength bilaterally  CN XII Midline tongue protrusion, symmetric L/R movements     ED Results / Procedures / Treatments   Labs (all labs ordered are listed, but only abnormal results are displayed) Labs Reviewed  BASIC METABOLIC PANEL - Abnormal; Notable for the following components:      Result Value   Glucose, Bld 144 (*)    BUN 31 (*)    Creatinine, Ser 1.49 (*)    GFR calc non Af Amer 40 (*)    GFR calc Af Amer 47 (*)    All other  components within normal limits  CBC - Abnormal; Notable for the following components:   WBC 16.8 (*)    RBC 4.11 (*)    All other components within normal limits  URINALYSIS, ROUTINE W REFLEX MICROSCOPIC - Abnormal; Notable for the following components:   Ketones, ur 5 (*)    All other components within normal limits  CBG MONITORING, ED - Abnormal; Notable for the following components:   Glucose-Capillary 112 (*)    All other components within normal limits    EKG EKG Interpretation  Date/Time:  Friday January 13 2020 16:59:01 EDT Ventricular Rate:  93 PR Interval:  178 QRS Duration: 68 QT Interval:  352 QTC Calculation: 437 R Axis:   -71 Text Interpretation: Normal sinus rhythm Low voltage QRS Left anterior fascicular block Cannot rule out Anterior infarct , age undetermined Abnormal ECG Confirmed by Kennis Carina [phone]) on 01/13/2020 6:51:56 PM   Radiology DG Chest 2 View  Result Date: 01/13/2020 CLINICAL DATA:  Pain after fall EXAM: CHEST - 2 VIEW COMPARISON:  April 07, 2016 FINDINGS: The heart size and mediastinal contours are within normal limits. Both lungs are clear. The visualized skeletal structures are unremarkable. IMPRESSION: No active cardiopulmonary disease. Electronically Signed   By: Gerome Sam III M.D   On: 01/13/2020 21:01   DG Lumbar Spine Complete   Result Date: 01/13/2020 CLINICAL DATA:  Pain after fall EXAM: LUMBAR SPINE - COMPLETE 4+ VIEW COMPARISON:  None. FINDINGS: No malalignment. Scalping of the superior endplate of L1, apparently new since 2017. No other fracture or traumatic malalignment. Calcified atherosclerosis in the abdominal aorta. IMPRESSION: There is scalloping of the superior endplate of L1 which is new since 2017. This is consistent with an age indeterminate compression fracture. If there is concern for an acute compression fracture at this level, MRI could better assess acuity. Electronically Signed   By: Gerome Sam III M.D   On: 01/13/2020 21:00   DG Hips Bilat W or Wo Pelvis 3-4 Views  Result Date: 01/13/2020 CLINICAL DATA:  Fall.  History of hip fracture. EXAM: DG HIP (WITH OR WITHOUT PELVIS) 3-4V BILAT COMPARISON:  None. FINDINGS: There is no evidence of hip fracture or dislocation. There is no evidence of arthropathy or other focal bone abnormality. IMPRESSION: Negative. Electronically Signed   By: Gerome Sam III M.D   On: 01/13/2020 20:57    Procedures Procedures (including critical care time)  Medications Ordered in ED Medications  sodium chloride flush (NS) 0.9 % injection 3 mL (3 mLs Intravenous Given 01/13/20 1919)  acetaminophen (TYLENOL) tablet 650 mg (650 mg Oral Given 01/13/20 1949)    ED Course  I have reviewed the triage vital signs and the nursing notes.  Pertinent labs & imaging results that were available during my care of the patient were reviewed by me and considered in my medical decision making (see chart for details).    MDM Rules/Calculators/A&P                      Patient is 86 year old male presented today with fall that occurred at 1:30 AM this morning.  He states that he feels well.  His main pain other than some mild low back pain.  He has no midline tenderness palpation.  Doubt a sacral fracture however I do have some concern for compression fracture given that he fell onto  his butt.  Will obtain plain film x-rays of lumbar spine to assess for compression fracture.  I  debility reviewed x-ray of hip, lumbar spine chest x-ray.  No acute abnormality.  Patient denies any pain currently.  He has received Tylenol and states the feels much better.  I discussed this case with my attending physician who cosigned this note including patient's presenting symptoms, physical exam, and planned diagnostics and interventions. Attending physician stated agreement with plan or made changes to plan which were implemented.   Attending physician assessed patient at bedside.   Plan is to ambulate patient and discharge him home if he is able to ambulate with no difficulty.  Long decision-making physician with patient and son at bedside about neck steps.  Patient and son will discuss with their primary care doctor about potentially finding home health/assisted living facility for patient.  ----------------  The medical records were personally reviewed by myself. I personally reviewed all lab results and interpreted all imaging studies and either concurred with their official read or contacted radiology for clarification. Additional history obtained from old records family members.  This patient appears reasonably screened and I doubt any other medical condition requiring further workup, evaluation, or treatment in the ED at this time prior to discharge.   Patient's vitals are WNL apart from vital sign abnormalities discussed above, patient is in NAD, and able to ambulate in the ED at their baseline and able to tolerate PO.  Pain has been managed or a plan has been made for home management and has no complaints prior to discharge. Patient is comfortable with above plan and for discharge at this time. All questions were answered prior to disposition. Results from the ER workup discussed with the patient face to face and all questions answered to the best of my ability. The patient is safe for  discharge with strict return precautions. Patient appears safe for discharge with appropriate follow-up. Conveyed my impression with the patient and they voiced understanding and are agreeable to plan.   An After Visit Summary was printed and given to the patient.  Portions of this note were generated with [employer]. Dictation errors may occur despite best attempts at proofreading.    Final Clinical Impression(s) / ED Diagnoses Final diagnoses:  Compression fracture of L1 vertebra, initial encounter [facility])  Fall, initial encounter    Rx / DC Orders ED Discharge Orders    None       Tedd Sias, [place] 01/13/20 2257    Maudie Flakes, MD 01/16/20 [phone]

## 2020-01-13 NOTE — Discharge Instructions (Addendum)
You have a small compression fracture of the L1 vertebra.  This may be a chronic issue that we only discovered today or could be from the fall that he had this morning.  Please follow-up with your primary care doctor for reevaluation.  Please use Tylenol for pain.  Please return to ED if you have any new or concerning symptoms.  Please follow-up with your primary care doctor to discuss possibility of home health/skilled nursing facility placement.

## 2020-01-13 NOTE — ED Triage Notes (Signed)
Pt BIB son from home, son reports pt fell last night around 1:30 am and has had multiple falls recently. Pt c/o back pain, cough and generalized weakness. Pt cannot recall if he hit his head or had any dizziness/LOC at the time of the fall. Son reports pt has been having memory issues.

## 2020-01-19 ENCOUNTER — Telehealth: Payer: Self-pay | Admitting: Family Medicine

## 2020-01-19 NOTE — Telephone Encounter (Signed)
Eric Berger son POA would like to speak with Dr. Tanya Nones concerning his dad  care not eating,weak, also stating that he wants to died CB# [phone]

## 2020-01-20 ENCOUNTER — Emergency Department (HOSPITAL_COMMUNITY): Payer: Medicare HMO

## 2020-01-20 ENCOUNTER — Inpatient Hospital Stay (HOSPITAL_COMMUNITY)
Admission: EM | Admit: 2020-01-20 | Discharge: 2020-01-24 | DRG: 205 | Disposition: A | Discharge status: Skilled Nursing Facility | Payer: Medicare HMO | Attending: Internal Medicine | Admitting: Internal Medicine

## 2020-01-20 ENCOUNTER — Encounter (HOSPITAL_COMMUNITY): Payer: Self-pay | Admitting: Emergency Medicine

## 2020-01-20 ENCOUNTER — Other Ambulatory Visit: Payer: Self-pay

## 2020-01-20 ENCOUNTER — Inpatient Hospital Stay (HOSPITAL_COMMUNITY): Payer: Medicare HMO

## 2020-01-20 DIAGNOSIS — R627 Adult failure to thrive: Secondary | ICD-10-CM | POA: Diagnosis not present

## 2020-01-20 DIAGNOSIS — Y92009 Unspecified place in unspecified non-institutional (private) residence as the place of occurrence of the external cause: Secondary | ICD-10-CM

## 2020-01-20 DIAGNOSIS — N179 Acute kidney failure, unspecified: Secondary | ICD-10-CM

## 2020-01-20 DIAGNOSIS — J690 Pneumonitis due to inhalation of food and vomit: Secondary | ICD-10-CM | POA: Diagnosis present

## 2020-01-20 DIAGNOSIS — J9819 Other pulmonary collapse: Secondary | ICD-10-CM | POA: Diagnosis not present

## 2020-01-20 DIAGNOSIS — E785 Hyperlipidemia, unspecified: Secondary | ICD-10-CM | POA: Diagnosis present

## 2020-01-20 DIAGNOSIS — T17908A Unspecified foreign body in respiratory tract, part unspecified causing other injury, initial encounter: Secondary | ICD-10-CM

## 2020-01-20 DIAGNOSIS — M6281 Muscle weakness (generalized): Secondary | ICD-10-CM | POA: Diagnosis not present

## 2020-01-20 DIAGNOSIS — I340 Nonrheumatic mitral (valve) insufficiency: Secondary | ICD-10-CM | POA: Diagnosis present

## 2020-01-20 DIAGNOSIS — R41841 Cognitive communication deficit: Secondary | ICD-10-CM | POA: Diagnosis not present

## 2020-01-20 DIAGNOSIS — S32010D Wedge compression fracture of first lumbar vertebra, subsequent encounter for fracture with routine healing: Secondary | ICD-10-CM | POA: Diagnosis not present

## 2020-01-20 DIAGNOSIS — I5032 Chronic diastolic (congestive) heart failure: Secondary | ICD-10-CM | POA: Diagnosis not present

## 2020-01-20 DIAGNOSIS — Z96641 Presence of right artificial hip joint: Secondary | ICD-10-CM | POA: Diagnosis present

## 2020-01-20 DIAGNOSIS — D72829 Elevated white blood cell count, unspecified: Secondary | ICD-10-CM | POA: Diagnosis not present

## 2020-01-20 DIAGNOSIS — Z9049 Acquired absence of other specified parts of digestive tract: Secondary | ICD-10-CM

## 2020-01-20 DIAGNOSIS — M2550 Pain in unspecified joint: Secondary | ICD-10-CM | POA: Diagnosis not present

## 2020-01-20 DIAGNOSIS — T1490XA Injury, unspecified, initial encounter: Secondary | ICD-10-CM

## 2020-01-20 DIAGNOSIS — H9190 Unspecified hearing loss, unspecified ear: Secondary | ICD-10-CM | POA: Diagnosis present

## 2020-01-20 DIAGNOSIS — I110 Hypertensive heart disease with heart failure: Secondary | ICD-10-CM | POA: Diagnosis not present

## 2020-01-20 DIAGNOSIS — M5489 Other dorsalgia: Secondary | ICD-10-CM | POA: Diagnosis not present

## 2020-01-20 DIAGNOSIS — N182 Chronic kidney disease, stage 2 (mild): Secondary | ICD-10-CM | POA: Diagnosis not present

## 2020-01-20 DIAGNOSIS — W19XXXA Unspecified fall, initial encounter: Secondary | ICD-10-CM | POA: Diagnosis present

## 2020-01-20 DIAGNOSIS — Z8781 Personal history of (healed) traumatic fracture: Secondary | ICD-10-CM

## 2020-01-20 DIAGNOSIS — R5381 Other malaise: Secondary | ICD-10-CM | POA: Diagnosis not present

## 2020-01-20 DIAGNOSIS — T17520A Food in bronchus causing asphyxiation, initial encounter: Secondary | ICD-10-CM | POA: Diagnosis not present

## 2020-01-20 DIAGNOSIS — R52 Pain, unspecified: Secondary | ICD-10-CM | POA: Diagnosis not present

## 2020-01-20 DIAGNOSIS — S32010A Wedge compression fracture of first lumbar vertebra, initial encounter for closed fracture: Secondary | ICD-10-CM | POA: Diagnosis present

## 2020-01-20 DIAGNOSIS — I130 Hypertensive heart and chronic kidney disease with heart failure and stage 1 through stage 4 chronic kidney disease, or unspecified chronic kidney disease: Secondary | ICD-10-CM | POA: Diagnosis not present

## 2020-01-20 DIAGNOSIS — R1110 Vomiting, unspecified: Secondary | ICD-10-CM | POA: Diagnosis not present

## 2020-01-20 DIAGNOSIS — J189 Pneumonia, unspecified organism: Secondary | ICD-10-CM | POA: Diagnosis not present

## 2020-01-20 DIAGNOSIS — F0390 Unspecified dementia without behavioral disturbance: Secondary | ICD-10-CM | POA: Diagnosis present

## 2020-01-20 DIAGNOSIS — N281 Cyst of kidney, acquired: Secondary | ICD-10-CM | POA: Diagnosis present

## 2020-01-20 DIAGNOSIS — I69341 Monoplegia of lower limb following cerebral infarction affecting right dominant side: Secondary | ICD-10-CM | POA: Diagnosis not present

## 2020-01-20 DIAGNOSIS — N19 Unspecified kidney failure: Secondary | ICD-10-CM

## 2020-01-20 DIAGNOSIS — J181 Lobar pneumonia, unspecified organism: Secondary | ICD-10-CM | POA: Diagnosis not present

## 2020-01-20 DIAGNOSIS — Z87891 Personal history of nicotine dependence: Secondary | ICD-10-CM

## 2020-01-20 DIAGNOSIS — Z20822 Contact with and (suspected) exposure to covid-19: Secondary | ICD-10-CM | POA: Diagnosis not present

## 2020-01-20 DIAGNOSIS — T17900A Unspecified foreign body in respiratory tract, part unspecified causing asphyxiation, initial encounter: Secondary | ICD-10-CM | POA: Diagnosis not present

## 2020-01-20 DIAGNOSIS — M6389 Disorders of muscle in diseases classified elsewhere, multiple sites: Secondary | ICD-10-CM | POA: Diagnosis not present

## 2020-01-20 DIAGNOSIS — X58XXXA Exposure to other specified factors, initial encounter: Secondary | ICD-10-CM | POA: Diagnosis present

## 2020-01-20 DIAGNOSIS — E876 Hypokalemia: Secondary | ICD-10-CM | POA: Diagnosis not present

## 2020-01-20 DIAGNOSIS — K08109 Complete loss of teeth, unspecified cause, unspecified class: Secondary | ICD-10-CM | POA: Diagnosis present

## 2020-01-20 DIAGNOSIS — R296 Repeated falls: Secondary | ICD-10-CM | POA: Diagnosis present

## 2020-01-20 DIAGNOSIS — Z7982 Long term (current) use of aspirin: Secondary | ICD-10-CM

## 2020-01-20 DIAGNOSIS — R410 Disorientation, unspecified: Secondary | ICD-10-CM | POA: Diagnosis not present

## 2020-01-20 DIAGNOSIS — Z8701 Personal history of pneumonia (recurrent): Secondary | ICD-10-CM

## 2020-01-20 DIAGNOSIS — Z6827 Body mass index (BMI) 27.0-27.9, adult: Secondary | ICD-10-CM | POA: Diagnosis not present

## 2020-01-20 DIAGNOSIS — I10 Essential (primary) hypertension: Secondary | ICD-10-CM

## 2020-01-20 DIAGNOSIS — R2681 Unsteadiness on feet: Secondary | ICD-10-CM | POA: Diagnosis not present

## 2020-01-20 DIAGNOSIS — I959 Hypotension, unspecified: Secondary | ICD-10-CM | POA: Diagnosis not present

## 2020-01-20 DIAGNOSIS — R1311 Dysphagia, oral phase: Secondary | ICD-10-CM | POA: Diagnosis not present

## 2020-01-20 DIAGNOSIS — R630 Anorexia: Secondary | ICD-10-CM

## 2020-01-20 DIAGNOSIS — Z79899 Other long term (current) drug therapy: Secondary | ICD-10-CM

## 2020-01-20 DIAGNOSIS — Z833 Family history of diabetes mellitus: Secondary | ICD-10-CM

## 2020-01-20 DIAGNOSIS — R0902 Hypoxemia: Secondary | ICD-10-CM | POA: Diagnosis not present

## 2020-01-20 DIAGNOSIS — Z7401 Bed confinement status: Secondary | ICD-10-CM | POA: Diagnosis not present

## 2020-01-20 DIAGNOSIS — R05 Cough: Secondary | ICD-10-CM | POA: Diagnosis not present

## 2020-01-20 LAB — COMPREHENSIVE METABOLIC PANEL
ALT: 31 U/L (ref 0–44)
AST: 45 U/L — ABNORMAL HIGH (ref 15–41)
Albumin: 3.2 g/dL — ABNORMAL LOW (ref 3.5–5.0)
Alkaline Phosphatase: 65 U/L (ref 38–126)
Anion gap: 20 — ABNORMAL HIGH (ref 5–15)
BUN: 57 mg/dL — ABNORMAL HIGH (ref 8–23)
CO2: 22 mmol/L (ref 22–32)
Calcium: 9.2 mg/dL (ref 8.9–10.3)
Chloride: 95 mmol/L — ABNORMAL LOW (ref 98–111)
Creatinine, Ser: 1.47 mg/dL — ABNORMAL HIGH (ref 0.61–1.24)
GFR calc Af Amer: 48 mL/min — ABNORMAL LOW (ref >60)
GFR calc non Af Amer: 41 mL/min — ABNORMAL LOW (ref >60)
Glucose, Bld: 113 mg/dL — ABNORMAL HIGH (ref 70–99)
Potassium: 4.3 mmol/L (ref 3.5–5.1)
Sodium: 137 mmol/L (ref 135–145)
Total Bilirubin: 2.7 mg/dL — ABNORMAL HIGH (ref 0.3–1.2)
Total Protein: 7.1 g/dL (ref 6.5–8.1)

## 2020-01-20 LAB — CBC WITH DIFFERENTIAL/PLATELET
Abs Immature Granulocytes: 0.14 10*3/uL — ABNORMAL HIGH (ref 0.00–0.07)
Basophils Absolute: 0 10*3/uL (ref 0.0–0.1)
Basophils Relative: 0 %
Eosinophils Absolute: 0 10*3/uL (ref 0.0–0.5)
Eosinophils Relative: 0 %
HCT: 41.2 % (ref 39.0–52.0)
Hemoglobin: 13.9 g/dL (ref 13.0–17.0)
Immature Granulocytes: 1 %
Lymphocytes Relative: 8 %
Lymphs Abs: 1.1 10*3/uL (ref 0.7–4.0)
MCH: 31.8 pg (ref 26.0–34.0)
MCHC: 33.7 g/dL (ref 30.0–36.0)
MCV: 94.3 fL (ref 80.0–100.0)
Monocytes Absolute: 1.5 10*3/uL — ABNORMAL HIGH (ref 0.1–1.0)
Monocytes Relative: 11 %
Neutro Abs: 11.5 10*3/uL — ABNORMAL HIGH (ref 1.7–7.7)
Neutrophils Relative %: 80 %
Platelets: 280 10*3/uL (ref 150–400)
RBC: 4.37 MIL/uL (ref 4.22–5.81)
RDW: 12.4 % (ref 11.5–15.5)
WBC: 14.3 10*3/uL — ABNORMAL HIGH (ref 4.0–10.5)
nRBC: 0 % (ref 0.0–0.2)

## 2020-01-20 LAB — URINALYSIS, ROUTINE W REFLEX MICROSCOPIC
Bilirubin Urine: NEGATIVE
Glucose, UA: NEGATIVE mg/dL
Hgb urine dipstick: NEGATIVE
Ketones, ur: 20 mg/dL — AB
Leukocytes,Ua: NEGATIVE
Nitrite: NEGATIVE
Protein, ur: NEGATIVE mg/dL
Specific Gravity, Urine: 1.017 (ref 1.005–1.030)
pH: 5 (ref 5.0–8.0)

## 2020-01-20 LAB — POC SARS CORONAVIRUS 2 AG -  ED: SARS Coronavirus 2 Ag: NEGATIVE

## 2020-01-20 LAB — RESPIRATORY PANEL BY RT PCR (FLU A&B, COVID)
Influenza A by PCR: NEGATIVE
Influenza B by PCR: NEGATIVE
SARS Coronavirus 2 by RT PCR: NEGATIVE

## 2020-01-20 MED ORDER — SODIUM CHLORIDE 0.9 % IV BOLUS
1000.0000 mL | Freq: Once | INTRAVENOUS | Status: AC
  Administered 2020-01-20: 1000 mL via INTRAVENOUS

## 2020-01-20 MED ORDER — IOHEXOL 300 MG/ML  SOLN
100.0000 mL | Freq: Once | INTRAMUSCULAR | Status: AC | PRN
  Administered 2020-01-20: 100 mL via INTRAVENOUS

## 2020-01-20 MED ORDER — ENOXAPARIN SODIUM 40 MG/0.4ML ~~LOC~~ SOLN: 40.0000 mg | SUBCUTANEOUS | Status: DC

## 2020-01-20 MED ORDER — SODIUM CHLORIDE 0.9 % IV SOLN
1.0000 g | INTRAVENOUS | Status: DC
  Administered 2020-01-21: 1 g via INTRAVENOUS
  Filled 2020-01-20: qty 10

## 2020-01-20 MED ORDER — ENOXAPARIN SODIUM 30 MG/0.3ML ~~LOC~~ SOLN
30.0000 mg | SUBCUTANEOUS | Status: DC
  Administered 2020-01-20 – 2020-01-21 (×2): 30 mg via SUBCUTANEOUS
  Filled 2020-01-20 (×2): qty 0.3

## 2020-01-20 MED ORDER — SIMVASTATIN 20 MG PO TABS
40.0000 mg | ORAL_TABLET | Freq: Every day | ORAL | Status: DC
  Administered 2020-01-20 – 2020-01-24 (×5): 40 mg via ORAL
  Filled 2020-01-20 (×5): qty 2

## 2020-01-20 MED ORDER — ACETAMINOPHEN 650 MG RE SUPP: 650.0000 mg | Freq: Four times a day (QID) | RECTAL | Status: DC | PRN

## 2020-01-20 MED ORDER — ASPIRIN EC 325 MG PO TBEC
325.0000 mg | DELAYED_RELEASE_TABLET | Freq: Every day | ORAL | Status: DC
  Administered 2020-01-20 – 2020-01-24 (×5): 325 mg via ORAL
  Filled 2020-01-20 (×5): qty 1

## 2020-01-20 MED ORDER — AZITHROMYCIN 250 MG PO TABS
500.0000 mg | ORAL_TABLET | Freq: Every day | ORAL | Status: AC
  Administered 2020-01-20: 500 mg via ORAL
  Filled 2020-01-20: qty 2

## 2020-01-20 MED ORDER — PROMETHAZINE HCL 25 MG PO TABS: 12.5000 mg | ORAL_TABLET | Freq: Four times a day (QID) | ORAL | Status: DC | PRN

## 2020-01-20 MED ORDER — SODIUM CHLORIDE 0.9 % IV SOLN
500.0000 mg | Freq: Once | INTRAVENOUS | Status: AC
  Administered 2020-01-20: 500 mg via INTRAVENOUS
  Filled 2020-01-20: qty 500

## 2020-01-20 MED ORDER — SODIUM CHLORIDE 0.9 % IV SOLN
2.0000 g | Freq: Once | INTRAVENOUS | Status: AC
  Administered 2020-01-20: 2 g via INTRAVENOUS
  Filled 2020-01-20: qty 20

## 2020-01-20 MED ORDER — METOPROLOL TARTRATE 5 MG/5ML IV SOLN: 5.0000 mg | Freq: Four times a day (QID) | INTRAVENOUS | Status: DC | PRN

## 2020-01-20 MED ORDER — ADULT MULTIVITAMIN W/MINERALS CH
1.0000 | ORAL_TABLET | Freq: Every day | ORAL | Status: DC
  Administered 2020-01-20 – 2020-01-24 (×5): 1 via ORAL
  Filled 2020-01-20 (×5): qty 1

## 2020-01-20 MED ORDER — AZITHROMYCIN 250 MG PO TABS
250.0000 mg | ORAL_TABLET | Freq: Every day | ORAL | Status: DC
  Administered 2020-01-21: 250 mg via ORAL
  Filled 2020-01-20 (×2): qty 1

## 2020-01-20 MED ORDER — SAW PALMETTO 450 MG PO CAPS: 450.0000 mg | ORAL_CAPSULE | Freq: Every day | ORAL | Status: DC

## 2020-01-20 MED ORDER — SODIUM CHLORIDE 0.9 % IV SOLN: INTRAVENOUS | Status: AC

## 2020-01-20 MED ORDER — ACETAMINOPHEN 325 MG PO TABS
650.0000 mg | ORAL_TABLET | Freq: Four times a day (QID) | ORAL | Status: DC | PRN
  Administered 2020-01-21: 650 mg via ORAL
  Filled 2020-01-20: qty 2

## 2020-01-20 NOTE — ED Notes (Signed)
Pt transported to US

## 2020-01-20 NOTE — Progress Notes (Signed)
PT Cancellation Note  Patient Details Name: Eric Berger MRN: [id number] DOB: [birth year]   Cancelled Treatment:    Reason Eval/Treat Not Completed: Other (comment). PT arrived to ED to see pt for evaluation. Upon arrival RN reporting MD preparing to cancel order until pt admitted. PT will follow up at a later time.   Arlyss Gandy 01/20/2020, 5:20 PM

## 2020-01-20 NOTE — ED Notes (Signed)
Pt transported to CT ?

## 2020-01-20 NOTE — ED Notes (Signed)
Pt BP 92/43. Pt alert and oriented to baseline. HR 73 NSR. MD paged

## 2020-01-20 NOTE — ED Notes (Signed)
No return page from MD. Floor coverage paged.

## 2020-01-20 NOTE — Telephone Encounter (Signed)
Please set up a phone visit between me and his son for documentation purposes in case I need to order anything (home health etc)

## 2020-01-20 NOTE — ED Notes (Signed)
X-ray at bedside

## 2020-01-20 NOTE — ED Notes (Signed)
Pt BP 89/42(58) after 1 L bolus. Floor coverage paged.

## 2020-01-20 NOTE — ED Triage Notes (Addendum)
Pt arrives via [facility] from home for increased falls. Seen here last Friday after a fall, discharged with compressed disc. Family reports that falls have increased and they are having difficulty caring for him. Hx of dementia. Ems vss. Alert/oriented to his baseline (can recite name and birthday).

## 2020-01-20 NOTE — ED Provider Notes (Signed)
[facility] EMERGENCY DEPARTMENT Provider Note   CSN: [id number] Arrival date & time: 01/20/20  1057     History Chief Complaint  Patient presents with  . Fall    Eric Berger is a 86 y.o. male history of dementia, hypertension, renal disease, lumbar compression fracture, presented to emergency department with recurrent falls and failure to thrive.  The patient was seen in our emergency department approximately 1 week ago on April 23 issues with recurrent falls.  He had x-rays of the L-spine at that time was noted to have a compression fracture of the lumbar spine.  He was able to ambulate in the emergency department.  After long goals of care discussion with the family, he was discharged home.  He returns today by EMS with his son present at the bedside reporting that the patient continues to have recurrent falls, approximate 3-4 falls.  The patient is complaining of pain particularly upon standing.  He is able to walk with a walker but seems to be in pain in his back.  His son also reports that the patient has had poor appetite for the past several weeks to months, but increasingly is refusing to eat anything in the daytime.  The patient has had several episodes of vomiting after eating.  The family feels he can no longer care for him.  The patient's wife is 57 years old and frail, and the patient's daughter has back problems.  The patient's son is often out on the job and is unable to be at home to monitor his father.  His son Ulice Dash tells me that the patient has had home PT and home health set up, but it doesn't work "because he won't listen to them, he's stubborn, he just does what he wants, and all these people in the house are flustering my mother."  His son and the family are interested in more longitudinal placement for the patient at this time, believing he simply requires a higher level of care than they can provide.  HPI     Past Medical History:  Diagnosis  Date  . Cataracts, bilateral   . Chronic diastolic CHF (congestive heart failure) ([place])   . GERD (gastroesophageal reflux disease)   . Hearing loss   . Hernia   . High cholesterol   . Hypertension   . Renal disorder   . Stroke [facility])     Patient Active Problem List   Diagnosis Date Noted  . Closed right hip fracture ([place]) 04/04/2016  . Nausea & vomiting 04/04/2016  . Stroke ([place])   . High cholesterol   . Chronic diastolic CHF (congestive heart failure) ([place])   . Dysphagia 11/23/2015  . Mitral regurgitation 09/29/2015  . CAP (community acquired pneumonia) 09/23/2015  . ARF (acute renal failure) ([place]) 09/23/2015  . HTN (hypertension) 11/07/2014  . Encounter for long-term (current) use of other high-risk medications 11/07/2014  . G E R D 12/02/2007  . ASPIRATION FOREIGN BODY 12/02/2007    Past Surgical History:  Procedure Laterality Date  . CHOLECYSTECTOMY    . EYE SURGERY    . HERNIA REPAIR    . HIP ARTHROPLASTY Right 04/05/2016   Procedure: RIGHT HIP HEMIARTHROPLASTY;  Surgeon: Renette Butters, MD;  Location: [place];  Service: Orthopedics;  Laterality: Right;       Family History  Problem Relation Age of Onset  . Diabetes Mellitus II Brother     Social History   Tobacco Use  . Smoking  status: Former Smoker    Quit date: 06/08/1962    Years since quitting: 57.6  . Smokeless tobacco: Never Used  Substance Use Topics  . Alcohol use: No  . Drug use: No    Home Medications Prior to Admission medications   Medication Sig Start Date End Date Taking? Authorizing Provider  Apoaequorin (PREVAGEN EXTRA STRENGTH PO) Take 1 tablet by mouth daily.    [provider]  aspirin 81 MG tablet Take 1 tablet (81 mg total) by mouth daily. Patient not taking: Reported on 01/13/2020 03/02/18   Donita Brooks, MD  aspirin EC 325 MG tablet Take 325 mg by mouth daily.    [provider]  hydrochlorothiazide (HYDRODIURIL) 25 MG tablet TAKE 1 TABLET BY MOUTH EVERY  DAY Patient taking differently: Take 25 mg by mouth daily.  12/05/19   Donita Brooks, MD  losartan (COZAAR) 100 MG tablet TAKE 1/2 TABLET BY MOUTH DAILY Patient taking differently: Take 50 mg by mouth daily.  05/31/19   Donita Brooks, MD  Multiple Vitamin (MULTIVITAMIN WITH MINERALS) TABS tablet Take 1 tablet by mouth daily. Centrum Silver    [provider]  Saw Palmetto 450 MG CAPS Take 450 mg by mouth daily.     [provider]  simvastatin (ZOCOR) 40 MG tablet 1 TABLET BY MOUTH ONCE DAILY AT 6PM. REQUIRES OFFICE VISIT BEFORE ANY FURTHER REFILLS CAN BE GIVEN Patient taking differently: Take 40 mg by mouth daily.  12/05/19   Donita Brooks, MD    Allergies    Patient has no known allergies.  Review of Systems   Review of Systems  Unable to perform ROS: Dementia (level 5 caveat)    Physical Exam Updated Vital Signs BP 105/68   Pulse 90   Temp 97.7 F (36.5 C) (Axillary)   Resp 18   Ht 5\' 5"  (1.651 m)   Wt 63.5 kg   SpO2 96%   BMI 23.30 kg/m   Physical Exam Vitals and nursing note reviewed.  Constitutional:      Appearance: He is well-developed.  HENT:     Head: Normocephalic and atraumatic.  Eyes:     Conjunctiva/sclera: Conjunctivae normal.  Cardiovascular:     Rate and Rhythm: Normal rate and regular rhythm.     Pulses: Normal pulses.  Pulmonary:     Effort: Pulmonary effort is normal. No respiratory distress.     Breath sounds: Normal breath sounds.  Abdominal:     General: There is no distension.     Palpations: Abdomen is soft.     Tenderness: There is no abdominal tenderness. There is no guarding.  Musculoskeletal:     Cervical back: Neck supple.  Skin:    General: Skin is warm and dry.     Comments: No spinal midline tenderness  Neurological:     General: No focal deficit present.     Mental Status: He is alert.     Cranial Nerves: No cranial nerve deficit.     Sensory: No sensory deficit.     Motor: No weakness.     Gait:  Gait normal.     ED Results / Procedures / Treatments   Labs (all labs ordered are listed, but only abnormal results are displayed) Labs Reviewed  COMPREHENSIVE METABOLIC PANEL - Abnormal; Notable for the following components:      Result Value   Chloride 95 (*)    Glucose, Bld 113 (*)    BUN 57 (*)  Creatinine, Ser 1.47 (*)    Albumin 3.2 (*)    AST 45 (*)    Total Bilirubin 2.7 (*)    GFR calc non Af Amer 41 (*)    GFR calc Af Amer 48 (*)    Anion gap 20 (*)    All other components within normal limits  CBC WITH DIFFERENTIAL/PLATELET - Abnormal; Notable for the following components:   WBC 14.3 (*)    Neutro Abs 11.5 (*)    Monocytes Absolute 1.5 (*)    Abs Immature Granulocytes 0.14 (*)    All other components within normal limits  URINALYSIS, ROUTINE W REFLEX MICROSCOPIC - Abnormal; Notable for the following components:   Ketones, ur 20 (*)    All other components within normal limits  RESPIRATORY PANEL BY RT PCR (FLU A&B, COVID)  POC SARS CORONAVIRUS 2 AG -  ED    EKG None  Radiology DG Chest Portable 1 View  Result Date: 01/20/2020 CLINICAL DATA:  Cough and recent falls. EXAM: PORTABLE CHEST 1 VIEW COMPARISON:  01/13/2020 and prior radiographs FINDINGS: The cardiomediastinal silhouette is unremarkable. Streaky LEFT LOWER lung opacity noted and may represent pneumonia. There is no evidence of pulmonary edema, suspicious pulmonary nodule/mass, pleural effusion, or pneumothorax. No acute bony abnormalities are identified. IMPRESSION: Streaky LEFT LOWER lung opacity which may represent pneumonia. Radiographic follow-up to resolution recommended. Electronically Signed   By: Harmon Pier M.D.   On: 01/20/2020 14:24    Procedures Procedures (including critical care time)  Medications Ordered in ED Medications  sodium chloride 0.9 % bolus 1,000 mL (0 mLs Intravenous Stopped 01/20/20 1519)    ED Course  I have reviewed the triage vital signs and the nursing  notes.  Pertinent labs & imaging results that were available during my care of the patient were reviewed by me and considered in my medical decision making (see chart for details).  86 yo male w/ dementia presenting with falls and failure to thrive.  Falls have been recurrent for several weeks now.  He was diagnosed with a lumbar compression fracture in her ED about a week ago but, was able to ambulate, and had no neuro deficits documented, therefore was discharged home. His son tells me the patient continues to have falls at home from wounding.  It sounds like this is related to pain, as the patient is having falls only when standing and trying to walk.  On my exam the patient is a 5 out of 5 strength in the lower extremities and no neurological deficit suggestive of cord compression.  He has no spinal midline tenderness.  I do think it is reasonable to obtain a CT scan of lumbar spine to better evaluate this fracture from a week ago.  I would also like to see if he has other additional fractures.    We will also obtain a basic metabolic work-up as well as UA to look for other evidence of weakness or infection to explain his recurrent falls.  His son reports the patient is refusing to eat.  Although his benign abdominal exam, given his age I think a CT scan of the abdomen may be prudent to look for acute reversible causes for this.  If his work-up is otherwise unremarkable, he will need placement at a SNF and PT evaluation.  Unfortunately I suspect many of these conditions are due to his progressive decline in cognitive status and his dementia.  I explained this to his son, as well as the  general poor prognosis for patient his father's age.  His son (and the other family members) are aware of this.    The patient has not received COVID vaccines.  Clinical Course as of Jan 20 1532  Fri Jan 20, 2020  1521 Patient was signed out to Dr Stevie Kern EDP with plan to f/u on abdominal CT and lumbar spine CT.   If no acute findings, I think he would otherwise be stable for placement for a SNF, requiring PT evaluation and continued care.  His labs are otherwise similar to 1 week ago, with Cr at baseline, WBC trending down, no UTI on UA, and rapid covid negative.  Patchiness in xray not convincing for PNA at this time, will evaluate lower lung fields on CT as well   [MT]    Clinical Course User Index [MT] Belisa Eichholz, Kermit Balo, MD    Final Clinical Impression(s) / ED Diagnoses Final diagnoses:  Poor appetite    Rx / DC Orders ED Discharge Orders    None       Terald Sleeper, MD 01/20/20 1534

## 2020-01-20 NOTE — ED Notes (Signed)
RN spoke to Dr.Bodenheimer in regards to pt hypotension after fluid bolus. New order to be placed for another fluid bolus. Informed MD that pt selected level of care will not be appropriate with his BP. MD will see how pt responds to new fluid bolus before deciding on pt level of care.

## 2020-01-20 NOTE — ED Provider Notes (Signed)
Signout note  86 year old male with dementia, hypertension, CKD presents to ER with concern for frequent falls, failure to thrive.  No head trauma.  Recent ER visit with possible mild L-spine compression fracture.  Labs with borderline AKI, leukocytosis, slight T bili elevation.  CXR with pos admissionCT abdomen pelvis ordered with L-spine to further evaluate.  3:30 PM Received signout from Dr. Renaye Rakers  5:45 PM reassessed patient, discussed results including CT, labs with pt and son; will start abx for pos pna, leukocytosis; will c/s hosp for admission   Milagros Loll, MD 01/20/20 1746

## 2020-01-20 NOTE — Telephone Encounter (Signed)
Left detailed message on VM to schedule apt face to face and bring POA paperwork. We do not currently have a DPR on pt to even discuss medical issues over the phone.

## 2020-01-20 NOTE — ED Notes (Signed)
Pt hard stick, phlebotomy notified of need for blood cultures, will start antibiotics after first set collected

## 2020-01-20 NOTE — ED Notes (Signed)
RN called Elnita Maxwell, RN on 66M. Updated her of pt hypotension and that pt is getting a fluid bolus now. Will call her back once fluids are complete.

## 2020-01-20 NOTE — H&P (Signed)
History and Physical    Eric Berger DTO:[id number] DOB: 01/06/1926 DOA: 01/20/2020  PCP: Donita Brooks, MD  Patient coming from: Home  Chief Complaint: fall, weakness  HPI: Eric Berger is a 86 y.o. male with medical history significant of stroke (previous), CKD, HTN, HLD, GERD, HFpEF (reported, Last TTE was 2017 with no abnormalities) who presents for fall and weakness.  Son, Eric Berger, is in room and helps with reporting.  Per Eric Berger, about 2 weeks ago patient started doing poorly with nausea, vomiting decreased appetite.  He fell on Thursday of last week, Monday, Wednesday and Thursday of this week and needed assistance to get up.  His wife and daughter who live in the home were unable to get him up.  He has been throwing up everything except eggs and in the last 4 days he has eaten about 4 eggs.  He is not sure if his urine is decreased.  He has no pain.  He has a chronic cough for about 2 years, but it does seem to be worse.  He had pneumonia 2 years ago and since then has had a cough.  He needed thickener in his fluids at that time and had difficulty swallowing.  However, he has not used that for a while.  He is no longer to be cared for at home as his wife is elderly and his daughter is in poor health.  He presented to the ED on 4/23 and was noted to have a compression fracture in the L spine.  He only has pain when lying on it certain ways.  He reports no dizziness, chest pain, fever, chills, hematemesis, blood in the stool.  He has never had a colonoscopy and "never will."    ED Course: In the ED, he was found to have acute on chronic renal failure with a BUN/Cr ratio of 38.  His AG is 20, with small ketones in the urine.  His GFR is around 40, down from the 50s.  He has a WBC of 14.3 with a neutrophil predominance.  UA does not show an infection.  SARS test is negative. CXR showed a possible LLL pneumonia.  CT abdomen/pelvis showed consolidation of the LLL, findings more likely due to  collapse than pneumonia per the read, L1 compression fracture, renal cysts.    Review of Systems: As per HPI otherwise all other systems reviewed and are negative.  Past Medical History:  Diagnosis Date  . Cataracts, bilateral   . Chronic diastolic CHF (congestive heart failure) (HCC)   . GERD (gastroesophageal reflux disease)   . Hearing loss   . Hernia   . High cholesterol   . Hypertension   . Renal disorder   . Stroke [facility])     Past Surgical History:  Procedure Laterality Date  . CHOLECYSTECTOMY    . EYE SURGERY    . HERNIA REPAIR    . HIP ARTHROPLASTY Right 04/05/2016   Procedure: RIGHT HIP HEMIARTHROPLASTY;  Surgeon: Sheral Apley, MD;  Location: [facility] OR;  Service: Orthopedics;  Laterality: Right;    Social History  reports that he quit smoking about 57 years ago. He has never used smokeless tobacco. He reports that he does not drink alcohol or use drugs.  No Known Allergies  Family History  Problem Relation Age of Onset  . Diabetes Mellitus II Brother     Prior to Admission medications   Medication Sig Start Date End Date Taking? Authorizing Provider  [facility]  STRENGTH PO) Take 1 tablet by mouth daily.    [provider]  aspirin 81 MG tablet Take 1 tablet (81 mg total) by mouth daily. Patient not taking: Reported on 01/13/2020 03/02/18   Susy Frizzle, MD  aspirin EC 325 MG tablet Take 325 mg by mouth daily.    [provider]  hydrochlorothiazide (HYDRODIURIL) 25 MG tablet TAKE 1 TABLET BY MOUTH EVERY DAY Patient taking differently: Take 25 mg by mouth daily.  12/05/19   Susy Frizzle, MD  losartan (COZAAR) 100 MG tablet TAKE 1/2 TABLET BY MOUTH DAILY Patient taking differently: Take 50 mg by mouth daily.  05/31/19   Susy Frizzle, MD  Multiple Vitamin (MULTIVITAMIN WITH MINERALS) TABS tablet Take 1 tablet by mouth daily. Centrum Silver    [provider]  Saw Palmetto 450 MG CAPS Take 450 mg by mouth daily.      [provider]  simvastatin (ZOCOR) 40 MG tablet 1 TABLET BY MOUTH ONCE DAILY AT 6PM. REQUIRES OFFICE VISIT BEFORE ANY FURTHER REFILLS CAN BE GIVEN Patient taking differently: Take 40 mg by mouth daily.  12/05/19   Susy Frizzle, MD    Physical Exam: Vitals:   01/20/20 1057 01/20/20 1300 01/20/20 1616  BP: (!) 104/56 105/68 (!) 104/50  Pulse: 97 90 86  Resp: 18 18 18   Temp: 97.7 F (36.5 C)    TempSrc: Axillary    SpO2: 95% 96% 98%  Weight: 63.5 kg    Height: 5\' 5"  (1.651 m)      Constitutional: NAD, calm, comfortable, hard of hearing Eyes: lids and conjunctivae normal, anicteric sclerae ENMT: MM are dry.  Edentulous, no dentures in place, NCAT Neck: normal, supple Respiratory: Crackles and decreased breath sounds in the left base, otherwise clear with no wheezing Cardiovascular: RR, NR, no murmur.  No LE edema.  Pulses 2+ in radial and DP arteries Abdomen: +BS, NT, ND Musculoskeletal: no clubbing / cyanosis.  Normal muscle tone for age.  Skin: no rashes, lesions, ulcers on exposed skin Neurologic: Moving all extremities, no new deficits noted.  He does have some mild Rt leg weakness which is residual from previous stroke.  Psychiatric: Normal judgment and insight. Normal mood.    Labs on Admission: I have personally reviewed following labs and imaging studies  CBC: Recent Labs  Lab 01/20/20 1412  WBC 14.3*  NEUTROABS 11.5*  HGB 13.9  HCT 41.2  MCV 94.3  PLT 706    Basic Metabolic Panel: Recent Labs  Lab 01/20/20 1412  NA 137  K 4.3  CL 95*  CO2 22  GLUCOSE 113*  BUN 57*  CREATININE 1.47*  CALCIUM 9.2    GFR: Estimated Creatinine Clearance: 28.5 mL/min (A) (by C-G formula based on SCr of 1.47 mg/dL (H)).  Liver Function Tests: Recent Labs  Lab 01/20/20 1412  AST 45*  ALT 31  ALKPHOS 65  BILITOT 2.7*  PROT 7.1  ALBUMIN 3.2*    Urine analysis:    Component Value Date/Time   COLORURINE YELLOW 01/20/2020 1430   APPEARANCEUR  CLEAR 01/20/2020 1430   LABSPEC 1.017 01/20/2020 1430   PHURINE 5.0 01/20/2020 1430   GLUCOSEU NEGATIVE 01/20/2020 1430   HGBUR NEGATIVE 01/20/2020 1430   BILIRUBINUR NEGATIVE 01/20/2020 1430   KETONESUR 20 (A) 01/20/2020 1430   PROTEINUR NEGATIVE 01/20/2020 1430   UROBILINOGEN 0.2 06/08/2012 0901   NITRITE NEGATIVE 01/20/2020 1430   LEUKOCYTESUR NEGATIVE 01/20/2020 1430    Radiological Exams on Admission: CT  ABDOMEN PELVIS W CONTRAST  Result Date: 01/20/2020 CLINICAL DATA:  Nausea, vomiting and back pain after recent fall. EXAM: CT ABDOMEN AND PELVIS WITH CONTRAST TECHNIQUE: Multidetector CT imaging of the abdomen and pelvis was performed using the standard protocol following bolus administration of intravenous contrast. CONTRAST:  OMNIPAQUE IOHEXOL 300 MG/ML  SOLN COMPARISON:  09/24/2015 FINDINGS: Lower chest: Calcified granuloma over the lateral right middle lobe. Mild atelectasis over the medial right base improved compared to the previous exam. Moderate consolidation over the left lower lobe with suggestion of opacification over the associated left lower lobe bronchus as this likely represents postobstructive atelectasis, although infection is possible. No effusion. Calcified plaque is present over the left anterior descending and right coronary arteries. There is calcified plaque over the descending thoracic aorta. Hepatobiliary: There are a few tiny subcentimeter hypodensities too small to characterize but likely cysts or hemangiomas. Previous cholecystectomy. Subtle prominence of the central intrahepatic ducts. Pancreas: Normal. Spleen: Normal. Adrenals/Urinary Tract: Adrenal glands are normal. Kidneys are normal in size. Evidence of parapelvic left renal cysts 1 of which near the renal pelvis has associated calcification as these are unchanged. Subcentimeter left lower pole renal cortical hypodensity too small to characterize but likely a cyst. Ureters and bladder are unremarkable.  Stomach/Bowel: Stomach and small bowel are normal. Appendix is normal. Minimal diverticulosis of the colon with decompression of the ascending and transverse colon. No adjacent inflammatory change or free fluid. Mild fecal retention over the rectum. Vascular/Lymphatic: Moderate calcified plaque over the abdominal aorta which is normal in caliber. No adenopathy. Reproductive: Normal. Other: No free fluid or focal inflammatory change. Musculoskeletal: Degenerative changes of the spine. There is a mild L1 compression fracture new since the previous exam, although unchanged from 01/13/2020 plain films with age indeterminate. Right hip arthroplasty intact. IMPRESSION: 1.  No acute findings in the abdomen/pelvis. 2. Consolidation over the left lower lobe with obstruction of the associated left lower lobe bronchus as findings likely due to atelectasis/collapse and less likely infection. Minimal medial right basilar atelectasis. 3. Mild L1 compression fracture unchanged from the recent plain films 01/13/2020 but new since 2017 as age indeterminate. 4. Parapelvic left renal cysts unchanged and subcentimeter left renal cortical hypodensity too small to characterize but likely a cyst. 5. Few small subcentimeter liver hypodensities too small to characterize but likely cysts or hemangiomas. 6. Aortic Atherosclerosis (ICD10-I70.0). Atherosclerotic coronary artery disease. Electronically Signed   By: Elberta Fortis M.D.   On: 01/20/2020 16:24   CT L-SPINE NO CHARGE  Result Date: 01/20/2020 CLINICAL DATA:  Back pain after recent fall. EXAM: CT LUMBAR SPINE WITHOUT CONTRAST TECHNIQUE: Multidetector CT imaging of the lumbar spine was performed without intravenous contrast administration. Multiplanar CT image reconstructions were also generated. COMPARISON:  Lumbar spine plain films 01/13/2020 and previous CT 09/24/2015 FINDINGS: Segmentation: Normal. Alignment: Normal. Vertebrae: Mild spondylosis throughout the lumbar spine to  include facet arthropathy over the lower lumbar spine. There is a mild compression fracture of L1 with air immediately under the superior endplate as this is unchanged from the recent plain films, although new since 2017. Presence of air suggest degree of osteonecrosis likely more of a subacute to chronic injury. There is no significant retropulsion of the posterior border of this compression fracture. No fragments within the spinal canal. Mild bilateral neural foraminal narrowing at the L3-4 L4-5 levels. No significant minimal degree of canal stenosis at the L4-5 level greater than the L3-4 level. Paraspinal and other soft tissues: Negative. Disc levels: Disc  space narrowing at the L3-4 level unchanged. Minimal broad-based disc bulge at the L3-4 L4-5 levels without definite disc herniation. IMPRESSION: 1. Mild L1 compression fracture, age indeterminate. This fracture is new since 2017 and unchanged from 01/13/2020. Air under the superior endplate of L1 suggest a degree of osteonecrosis as the fracture is likely subacute to chronic in nature. 2. Mild spondylosis of the lumbar spine with disc disease at the L3-4 and L4-5 levels. Minimal canal stenosis at the L4-5 level greater than the L3-4 level. Electronically Signed   By: Elberta Fortisaniel  Boyle M.D.   On: 01/20/2020 16:35   DG Chest Portable 1 View  Result Date: 01/20/2020 CLINICAL DATA:  Cough and recent falls. EXAM: PORTABLE CHEST 1 VIEW COMPARISON:  01/13/2020 and prior radiographs FINDINGS: The cardiomediastinal silhouette is unremarkable. Streaky LEFT LOWER lung opacity noted and may represent pneumonia. There is no evidence of pulmonary edema, suspicious pulmonary nodule/mass, pleural effusion, or pneumothorax. No acute bony abnormalities are identified. IMPRESSION: Streaky LEFT LOWER lung opacity which may represent pneumonia. Radiographic follow-up to resolution recommended. Electronically Signed   By: Harmon PierJeffrey  Hu M.D.   On: 01/20/2020 14:24      Assessment/Plan AKI (acute kidney injury) on CKD stage 2 - Renal function worsened - BUN/Cr ratio 38 - pre-renal - Renal ultrasound - Trend Cr - IVF with NS at 100cc/hr for 10 hours, monitor oxygen needs  CAP (community acquired pneumonia) vs. Lung collapse - Azithromycin + Rocephin - BC X 2 - follow - CT chest to evaluate for bronchogenic mass (history of smoking) - Tylenol for fever    HTN (hypertension) - Hold home HCTZ and losartan given kidney dysfunction - PRN IV metoprolol    HLD - Continue home simvastatin  H/O HFpEF - Reported by patient and son, but TTE from 2017 did not show any HFpEF.  Consider repeat if needed - Check TSH  H/O stroke, weakness - Has mild deficits - PT/OT evaluations - SLP evaluation  Recent fall with L spine fracture - Tylenol for pain  Generalized weakness and falls - PT/OT evaluation   DVT prophylaxis: Lovenox  Code Status:   Full - discussed with patient and son.  Son, Eric KotykJay, feels that DNR might be appropriate, however, would like to discuss with his mother and other family  Family Communication:  Son, Eric KotykJay, at bedside  Disposition Plan:   Patient is from:  Home  Anticipated DC to:  SNF  Anticipated DC date:  2-3 days  Anticipated DC barriers: PT eval, acute illness  Consults called:  None, Needs PT/OT/SLP  Admission status:  Med-surg, IP   Severity of Illness: The appropriate patient status for this patient is INPATIENT. Inpatient status is judged to be reasonable and necessary in order to provide the required intensity of service to ensure the patient's safety. The patient's presenting symptoms, physical exam findings, and initial radiographic and laboratory data in the context of their chronic comorbidities is felt to place them at high risk for further clinical deterioration. Furthermore, it is not anticipated that the patient will be medically stable for discharge from the hospital within 2 midnights of admission. The  following factors support the patient status of inpatient.   " The patient's presenting symptoms include cough, lung disease on imaging. " The worrisome physical exam findings include weakness, decreased breath sounds. " The initial radiographic and laboratory data are worrisome because of AKI on CKD. " The chronic co-morbidities include CKD, h/o stroke, debility.   * I certify that at  the point of admission it is my clinical judgment that the patient will require inpatient hospital care spanning beyond 2 midnights from the point of admission due to high intensity of service, high risk for further deterioration and high frequency of surveillance required.Debe Coder MD [facility]  How to contact the [facility] Attending or Consulting provider 7A - 7P or covering provider during after hours 7P -7A, for this patient?   1. Check the care team in [facility] and look for a) attending/consulting [facility] provider listed and b) the [facility] team listed 2. Log into [email or web address] and use [facility] universal password to access. If you do not have the password, please contact the hospital operator. 3. Locate the [facility] provider you are looking for under [facility] and page to a number that you can be directly reached. 4. If you still have difficulty reaching the provider, please page the [facility] (Director on Call) for the Hospitalists listed on amion for assistance.  01/20/2020, 7:21 PM

## 2020-01-21 ENCOUNTER — Inpatient Hospital Stay (HOSPITAL_COMMUNITY): Payer: Medicare HMO

## 2020-01-21 DIAGNOSIS — J189 Pneumonia, unspecified organism: Secondary | ICD-10-CM | POA: Diagnosis not present

## 2020-01-21 DIAGNOSIS — T17900A Unspecified foreign body in respiratory tract, part unspecified causing asphyxiation, initial encounter: Secondary | ICD-10-CM

## 2020-01-21 DIAGNOSIS — N179 Acute kidney failure, unspecified: Secondary | ICD-10-CM | POA: Diagnosis not present

## 2020-01-21 DIAGNOSIS — D72829 Elevated white blood cell count, unspecified: Secondary | ICD-10-CM | POA: Diagnosis not present

## 2020-01-21 DIAGNOSIS — I10 Essential (primary) hypertension: Secondary | ICD-10-CM | POA: Diagnosis not present

## 2020-01-21 LAB — COMPREHENSIVE METABOLIC PANEL WITH GFR
ALT: 24 U/L (ref 0–44)
AST: 27 U/L (ref 15–41)
Albumin: 2.2 g/dL — ABNORMAL LOW (ref 3.5–5.0)
Alkaline Phosphatase: 45 U/L (ref 38–126)
Anion gap: 11 (ref 5–15)
BUN: 49 mg/dL — ABNORMAL HIGH (ref 8–23)
CO2: 22 mmol/L (ref 22–32)
Calcium: 7.9 mg/dL — ABNORMAL LOW (ref 8.9–10.3)
Chloride: 107 mmol/L (ref 98–111)
Creatinine, Ser: 1.27 mg/dL — ABNORMAL HIGH (ref 0.61–1.24)
GFR calc Af Amer: 57 mL/min — ABNORMAL LOW (ref >60)
GFR calc non Af Amer: 49 mL/min — ABNORMAL LOW (ref >60)
Glucose, Bld: 89 mg/dL (ref 70–99)
Potassium: 2.9 mmol/L — ABNORMAL LOW (ref 3.5–5.1)
Sodium: 140 mmol/L (ref 135–145)
Total Bilirubin: 0.8 mg/dL (ref 0.3–1.2)
Total Protein: 5.4 g/dL — ABNORMAL LOW (ref 6.5–8.1)

## 2020-01-21 LAB — CBC
HCT: 31.8 % — ABNORMAL LOW (ref 39.0–52.0)
Hemoglobin: 11 g/dL — ABNORMAL LOW (ref 13.0–17.0)
MCH: 32.5 pg (ref 26.0–34.0)
MCHC: 34.6 g/dL (ref 30.0–36.0)
MCV: 94.1 fL (ref 80.0–100.0)
Platelets: 292 K/uL (ref 150–400)
RBC: 3.38 MIL/uL — ABNORMAL LOW (ref 4.22–5.81)
RDW: 12.6 % (ref 11.5–15.5)
WBC: 8.5 K/uL (ref 4.0–10.5)
nRBC: 0 % (ref 0.0–0.2)

## 2020-01-21 LAB — GLUCOSE, CAPILLARY
Glucose-Capillary: 61 mg/dL — ABNORMAL LOW (ref 70–99)
Glucose-Capillary: 78 mg/dL (ref 70–99)

## 2020-01-21 LAB — TSH: TSH: 2.048 u[IU]/mL (ref 0.350–4.500)

## 2020-01-21 MED ORDER — POTASSIUM CHLORIDE 20 MEQ/15ML (10%) PO SOLN
40.0000 meq | ORAL | Status: AC
  Administered 2020-01-21 (×2): 40 meq via ORAL
  Filled 2020-01-21 (×2): qty 30

## 2020-01-21 NOTE — Evaluation (Signed)
Physical Therapy Evaluation Patient Details Name: Eric Berger MRN: [id number] DOB: [birth year] Today's Date: 01/21/2020   History of Present Illness  Pt is a 86 yr old male who was admitted due to falls, decrease in appetitie, vomiting and increase in need for assistance. pmh but not limited to: CVA, CKD, HTN, HLD, GERD, HFpEF (reported, Last TTE was 2017 with no abnormalities), pneumonia 2 years ago  Clinical Impression  Pt is getting up to stand with assistance but is quite weak and sits fairly quickly.  Has drop in BP with transition to standing, and therefore recommend he go to rehab due to prior level of ability to walk on RW.  Sequence of BP:  Supine 105/38, sitting 104/50, standing 114/33. Follow acutely with monitoring of vitals and progression as pt can tolerate.     Follow Up Recommendations SNF    Equipment Recommendations  None recommended by PT    Recommendations for Other Services       Precautions / Restrictions Precautions Precautions: Fall;Other (comment) Precaution Comments: monitor vitals Restrictions Weight Bearing Restrictions: No      Mobility  Bed Mobility Overal bed mobility: Needs Assistance Bed Mobility: Supine to Sit;Sit to Supine     Supine to sit: Mod assist;+2 for physical assistance;+2 for safety/equipment Sit to supine: Mod assist;+2 for physical assistance;+2 for safety/equipment      Transfers Overall transfer level: Needs assistance Equipment used: Rolling walker (2 wheeled) Transfers: Sit to/from Stand Sit to Stand: Max assist;+2 physical assistance;+2 safety/equipment         General transfer comment: reminders and cues for hand placemetn  Ambulation/Gait             General Gait Details: unable to take steps due to BP drop  Stairs            Wheelchair Mobility    Modified Rankin (Stroke Patients Only)       Balance Overall balance assessment: Needs assistance Sitting-balance support: Bilateral  upper extremity supported;Feet supported Sitting balance-Leahy Scale: Fair     Standing balance support: Bilateral upper extremity supported;During functional activity Standing balance-Leahy Scale: Poor                               Pertinent Vitals/Pain Pain Assessment: No/denies pain    Home Living Family/patient expects to be discharged to:: Private residence Living Arrangements: Spouse/significant other Available Help at Discharge: Family Type of Home: House Home Access: Stairs to enter Entrance Stairs-Rails: Right;Left;Can reach both Entrance Stairs-Number of Steps: 3 Home Layout: One level Home Equipment: Environmental consultant - 2 wheels;Walker - 4 wheels;Shower seat;Grab bars - tub/shower Additional Comments: pt may be poor historian, no family available    Prior Function Level of Independence: Needs assistance(per pt the family helps him)   Gait / Transfers Assistance Needed: indoor mobility on RW  ADL's / Homemaking Assistance Needed: inconsistent history, unsure of his level  Comments: stays in pajamas at times     Hand Dominance   Dominant Hand: Right    Extremity/Trunk Assessment   Upper Extremity Assessment Upper Extremity Assessment: Defer to OT evaluation RUE Deficits / Details: decrease in coordination RUE Coordination: decreased fine motor LUE Deficits / Details: decrease in ROM    Lower Extremity Assessment Lower Extremity Assessment: Generalized weakness;LLE deficits/detail;RLE deficits/detail RLE Deficits / Details: 4+ strength RLE Coordination: decreased gross motor LLE Deficits / Details: 3+ to 4- strength    Cervical / Trunk Assessment  Cervical / Trunk Assessment: Kyphotic  Communication   Communication: HOH  Cognition Arousal/Alertness: Awake/alert Behavior During Therapy: WFL for tasks assessed/performed Overall Cognitive Status: No family/caregiver present to determine baseline cognitive functioning                                         General Comments General comments (skin integrity, edema, etc.): pt sustained drop in BP from his transition to sit and stand; Supine 105/38, sitting 104/50, standing 114/33.      Exercises     Assessment/Plan    PT Assessment Patient needs continued PT services  PT Problem List Decreased strength;Decreased range of motion;Decreased activity tolerance;Decreased balance;Decreased mobility;Decreased coordination;Decreased cognition;Decreased knowledge of use of DME;Decreased safety awareness;Cardiopulmonary status limiting activity       PT Treatment Interventions DME instruction;Gait training;Stair training;Functional mobility training;Therapeutic activities;Therapeutic exercise;Balance training;Neuromuscular re-education;Patient/family education    PT Goals (Current goals can be found in the Care Plan section)  Acute Rehab PT Goals Patient Stated Goal: get to bed PT Goal Formulation: With patient Time For Goal Achievement: 02/04/20 Potential to Achieve Goals: Good    Frequency Min 2X/week   Barriers to discharge Inaccessible home environment;Decreased caregiver support home with stairs to enter house    Co-evaluation PT/OT/SLP Co-Evaluation/Treatment: Yes Reason for Co-Treatment: Complexity of the patient's impairments (multi-system involvement);For patient/therapist safety PT goals addressed during session: Mobility/safety with mobility;Proper use of DME;Balance OT goals addressed during session: ADL's and self-care       AM-PAC PT "6 Clicks" Mobility  Outcome Measure Help needed turning from your back to your side while in a flat bed without using bedrails?: A Little Help needed moving from lying on your back to sitting on the side of a flat bed without using bedrails?: A Lot Help needed moving to and from a bed to a chair (including a wheelchair)?: A Lot Help needed standing up from a chair using your arms (e.g., wheelchair or bedside chair)?: A  Lot Help needed to walk in hospital room?: Total Help needed climbing 3-5 steps with a railing? : Total 6 Click Score: 11    End of Session Equipment Utilized During Treatment: Gait belt Activity Tolerance: Patient limited by fatigue;Treatment limited secondary to medical complications (Comment) Patient left: in bed;with call bell/phone within reach;with bed alarm set Nurse Communication: Mobility status PT Visit Diagnosis: Unsteadiness on feet (R26.81);Muscle weakness (generalized) (M62.81);Difficulty in walking, not elsewhere classified (R26.2)    Time: 1694-5038 PT Time Calculation (min) (ACUTE ONLY): 39 min   Charges:   PT Evaluation $PT Eval Moderate Complexity: 1 Mod         Ramond Dial 01/21/2020, 1:05 PM  Mee Hives, PT MS Acute Rehab Dept. Number: [place] [facility] [place]

## 2020-01-21 NOTE — Progress Notes (Signed)
PROGRESS NOTE  Eric Berger JOA:[id number] DOB: 19-Sep-1926 DOA: 01/20/2020 PCP: Donita Brooks, MD   LOS: 1 day   Brief Narrative / Interim history: 86 year old male with history of CVA, chronic kidney disease, HTN, HLD, heart failure with preserved EF with last TTE in 2017, came to the hospital with failure to thrive, falls, weakness.  For the past couple of weeks patient has been having poor appetite, nausea, vomiting and recurrent falls at home.  He has a history of having pneumonia about couple years ago and has had a chronic cough since.  He also had difficulty swallowing.  Subjective / 24h Interval events: He is feeling better this morning, denies any shortness of breath, denies any chest pain.  He is not sure why he is in the hospital.  Assessment & Plan: Principal Problem Left lower lobe pneumonia, POA-possibly aspiration pneumonia, no evidence of proximal bronchial occlusion.  Patient was started on antibiotics with ceftriaxone and azithromycin, continue for now -Speech therapy evaluation pending, for now continue n.p.o. given concern for aspiration  Active Problems Aspirated foreign body within the right mainstem bronchus extending into the right lower lobe bronchus-this appears to be bone, will consult pulmonology this morning to evaluate and consider retrieval  Acute kidney injury on chronic kidney disease stage II-baseline creatinine about 1.0-1.2, elevated to 1.5 on admission.  Received fluids overnight and has improved to 1.27 this morning.  Avoid nephrotoxic agents  Essential hypertension-blood pressure stable/soft this morning, hold home medications  Hyperlipidemia-continue home statin  Chronic diastolic CHF-appears stable and euvolemic this morning  Prior CVA, weakness-generalized weakness today.  Continue aspirin.  PT consult  Recent follow-up L-spine fracture- no pain, supportive management right now  Scheduled Meds: . aspirin EC  325 mg Oral Daily  .  azithromycin  250 mg Oral Daily  . enoxaparin (LOVENOX) injection  30 mg Subcutaneous Q24H  . multivitamin with minerals  1 tablet Oral Daily  . potassium chloride  40 mEq Oral Q3H  . simvastatin  40 mg Oral Daily   Continuous Infusions: . cefTRIAXone (ROCEPHIN)  IV     PRN Meds:.acetaminophen **OR** acetaminophen, metoprolol tartrate, promethazine  DVT prophylaxis: Lovenox Code Status: Full code Family Communication: no family at bedside, will update son when he arrives  Status is: Inpatient  Remains inpatient appropriate because:Ongoing diagnostic testing needed not appropriate for outpatient work up   Dispo: The patient is from: Home              Anticipated d/c is to: SNF              Anticipated d/c date is: 3 days              Patient currently is not medically stable to d/c.  Consultants:  Pumonary   Procedures:  None   Microbiology  None   Antimicrobials: Ceftriaxone / Azithromycin 4/30 >>    Objective: Vitals:   01/21/20 0200 01/21/20 0320 01/21/20 0453 01/21/20 0718  BP: (!) 108/53 (!) 104/45 99/84 (!) 99/41  Pulse: 71 68 69 66  Resp: (!) 28 18  17   Temp:  97.7 F (36.5 C) (!) 97.3 F (36.3 C) (!) 97.3 F (36.3 C)  TempSrc:  Oral Oral Oral  SpO2: 92% 90%  91%  Weight:      Height:        Intake/Output Summary (Last 24 hours) at 01/21/2020 0735 Last data filed at 01/20/2020 1519 Gross per 24 hour  Intake 1000 ml  Output --  Net 1000 ml   Filed Weights   01/20/20 1057  Weight: 63.5 kg    Examination:  Constitutional: NAD Eyes: no scleral icterus ENMT: Mucous membranes are moist.  Neck: normal, supple Respiratory: clear to auscultation bilaterally, no wheezing, no crackles. Normal respiratory effort.  Cardiovascular: Regular rate and rhythm, no murmurs / rubs / gallops. Trace LE edema.  Abdomen: non distended, no tenderness. Bowel sounds positive.  Musculoskeletal: no clubbing / cyanosis.  Skin: no rashes Neurologic: no focal deficits,  global weakness  Data Reviewed: I have independently reviewed following labs and imaging studies   CBC: Recent Labs  Lab 01/20/20 1412 01/21/20 0405  WBC 14.3* 8.5  NEUTROABS 11.5*  --   HGB 13.9 11.0*  HCT 41.2 31.8*  MCV 94.3 94.1  PLT 280 292   Basic Metabolic Panel: Recent Labs  Lab 01/20/20 1412 01/21/20 0405  NA 137 140  K 4.3 2.9*  CL 95* 107  CO2 22 22  GLUCOSE 113* 89  BUN 57* 49*  CREATININE 1.47* 1.27*  CALCIUM 9.2 7.9*   Liver Function Tests: Recent Labs  Lab 01/20/20 1412 01/21/20 0405  AST 45* 27  ALT 31 24  ALKPHOS 65 45  BILITOT 2.7* 0.8  PROT 7.1 5.4*  ALBUMIN 3.2* 2.2*   Coagulation Profile: No results for input(s): INR, PROTIME in the last 168 hours. HbA1C: No results for input(s): HGBA1C in the last 72 hours. CBG: Recent Labs  Lab 01/21/20 0732  GLUCAP 61*    Recent Results (from the past 240 hour(s))  Respiratory Panel by RT PCR (Flu A&B, Covid) - Nasopharyngeal Swab     Status: None   Collection Time: 01/20/20  4:16 PM   Specimen: Nasopharyngeal Swab  Result Value Ref Range Status   SARS Coronavirus 2 by RT PCR NEGATIVE NEGATIVE Final    Comment: (NOTE) SARS-CoV-2 target nucleic acids are NOT DETECTED. The SARS-CoV-2 RNA is generally detectable in upper respiratoy specimens during the acute phase of infection. The lowest concentration of SARS-CoV-2 viral copies this assay can detect is 131 copies/mL. A negative result does not preclude SARS-Cov-2 infection and should not be used as the sole basis for treatment or other patient management decisions. A negative result may occur with  improper specimen collection/handling, submission of specimen other than nasopharyngeal swab, presence of viral mutation(s) within the areas targeted by this assay, and inadequate number of viral copies (<131 copies/mL). A negative result must be combined with clinical observations, patient history, and epidemiological information. The expected  result is Negative. Fact Sheet for Patients:  https://www.moore.com/ Fact Sheet for Healthcare Providers:  https://www.young.biz/ This test is not yet ap proved or cleared by the [place] FDA and  has been authorized for detection and/or diagnosis of SARS-CoV-2 by FDA under an Emergency Use Authorization (EUA). This EUA will remain  in effect (meaning this test can be used) for the duration of the COVID-19 declaration under Section 564(b)(1) of the Act, 21 U.S.C. section 360bbb-3(b)(1), unless the authorization is terminated or revoked sooner.    Influenza A by PCR NEGATIVE NEGATIVE Final   Influenza B by PCR NEGATIVE NEGATIVE Final    Comment: (NOTE) The Xpert Xpress SARS-CoV-2/FLU/RSV assay is intended as an aid in  the diagnosis of influenza from Nasopharyngeal swab specimens and  should not be used as a sole basis for treatment. Nasal washings and  aspirates are unacceptable for Xpert Xpress SARS-CoV-2/FLU/RSV  testing. Fact Sheet for Patients: https://www.moore.com/ Fact Sheet for Healthcare Providers: https://www.young.biz/ This  test is not yet approved or cleared by the [place] and  has been authorized for detection and/or diagnosis of SARS-CoV-2 by  FDA under an Emergency Use Authorization (EUA). This EUA will remain  in effect (meaning this test can be used) for the duration of the  Covid-19 declaration under Section 564(b)(1) of the Act, 21  U.S.C. section 360bbb-3(b)(1), unless the authorization is  terminated or revoked. Performed at [facility], [place] [address] [place], [place] [address]   Blood culture (routine x 2)     Status: None (Preliminary result)   Collection Time: 01/20/20  6:47 PM   Specimen: Site Not Specified; Blood  Result Value Ref Range Status   Specimen Description SITE NOT SPECIFIED  Final   Special Requests   Final    BOTTLES DRAWN AEROBIC AND  ANAEROBIC Blood Culture adequate volume   Culture   Final    NO GROWTH < 12 HOURS Performed at [facility], [place] [address] [place], [place] [address]    Report Status PENDING  Incomplete     Radiology Studies: CT CHEST WO CONTRAST  Result Date: 01/21/2020 CLINICAL DATA:  Left lower lobe collapse EXAM: CT CHEST WITHOUT CONTRAST TECHNIQUE: Multidetector CT imaging of the chest was performed following the standard protocol without IV contrast. COMPARISON:  Chest radiograph 01/13/2020 FINDINGS: Cardiovascular: Calcific atherosclerosis of the aorta and coronary arteries. Heart size is normal. No pericardial effusion. Mediastinum/Nodes: Multiple subcentimeter mediastinal lymph nodes. Normal thyroid gland. No axillary adenopathy. Normal course of the esophagus. No hiatal hernia. Lungs/Pleura: There is a linear calcific density within the right main stem bronchus extending into the right lower lobe bronchus. The distal aspect appears adherent to the bronchial wall. There is left lower lobe consolidation without proximal bronchial occlusion. Upper Abdomen: No acute abnormality. Musculoskeletal: No chest wall mass or suspicious bone lesions identified. IMPRESSION: 1. Left lower lobe consolidation without proximal bronchial occlusion, likely aspiration/pneumonia. 2. Linear calcific density within the right main stem bronchus extending into the right lower lobe bronchus. This is probably an aspirated foreign body, likely an animal bone. Chronicity is indeterminate. Bronchoscopy and retrieval should be considered. 3. Aortic Atherosclerosis (ICD10-I70.0). Electronically Signed   By: Ulyses Jarred M.D.   On: 01/21/2020 02:20   CT ABDOMEN PELVIS W CONTRAST  Result Date: 01/20/2020 CLINICAL DATA:  Nausea, vomiting and back pain after recent fall. EXAM: CT ABDOMEN AND PELVIS WITH CONTRAST TECHNIQUE: Multidetector CT imaging of the abdomen and pelvis was performed using the standard protocol following bolus  administration of intravenous contrast. CONTRAST:  187mL OMNIPAQUE IOHEXOL 300 MG/ML  SOLN COMPARISON:  09/24/2015 FINDINGS: Lower chest: Calcified granuloma over the lateral right middle lobe. Mild atelectasis over the medial right base improved compared to the previous exam. Moderate consolidation over the left lower lobe with suggestion of opacification over the associated left lower lobe bronchus as this likely represents postobstructive atelectasis, although infection is possible. No effusion. Calcified plaque is present over the left anterior descending and right coronary arteries. There is calcified plaque over the descending thoracic aorta. Hepatobiliary: There are a few tiny subcentimeter hypodensities too small to characterize but likely cysts or hemangiomas. Previous cholecystectomy. Subtle prominence of the central intrahepatic ducts. Pancreas: Normal. Spleen: Normal. Adrenals/Urinary Tract: Adrenal glands are normal. Kidneys are normal in size. Evidence of parapelvic left renal cysts 1 of which near the renal pelvis has associated calcification as these are unchanged. Subcentimeter left lower pole renal cortical hypodensity too small to  characterize but likely a cyst. Ureters and bladder are unremarkable. Stomach/Bowel: Stomach and small bowel are normal. Appendix is normal. Minimal diverticulosis of the colon with decompression of the ascending and transverse colon. No adjacent inflammatory change or free fluid. Mild fecal retention over the rectum. Vascular/Lymphatic: Moderate calcified plaque over the abdominal aorta which is normal in caliber. No adenopathy. Reproductive: Normal. Other: No free fluid or focal inflammatory change. Musculoskeletal: Degenerative changes of the spine. There is a mild L1 compression fracture new since the previous exam, although unchanged from 01/13/2020 plain films with age indeterminate. Right hip arthroplasty intact. IMPRESSION: 1.  No acute findings in the  abdomen/pelvis. 2. Consolidation over the left lower lobe with obstruction of the associated left lower lobe bronchus as findings likely due to atelectasis/collapse and less likely infection. Minimal medial right basilar atelectasis. 3. Mild L1 compression fracture unchanged from the recent plain films 01/13/2020 but new since 2017 as age indeterminate. 4. Parapelvic left renal cysts unchanged and subcentimeter left renal cortical hypodensity too small to characterize but likely a cyst. 5. Few small subcentimeter liver hypodensities too small to characterize but likely cysts or hemangiomas. 6. Aortic Atherosclerosis (ICD10-I70.0). Atherosclerotic coronary artery disease. Electronically Signed   By: Elberta Fortisaniel  Boyle M.D.   On: 01/20/2020 16:24   US RENAL  Result Date: 01/20/2020 CLINICAL DATA:  Renal failure.  Acuity not specified. EXAM: RENAL / URINARY TRACT ULTRASOUND COMPLETE COMPARISON:  Contrast-enhanced abdominopelvic CT earlier today. FINDINGS: Right Kidney: Renal measurements: 9.7 x 4.3 x 4.6 cm = volume: 99.1 mL. Echogenicity within normal limits. No mass or hydronephrosis visualized. Left Kidney: Renal measurements: 10.8 x 4.8 x 4.5 cm = volume: 120 mL. No hydronephrosis. Parapelvic cysts were better appreciated on CT earlier today. No evidence of solid lesion. Bladder: Appears normal for degree of bladder distention. Both ureteral jets are seen. Other: None. IMPRESSION: 1. No hydronephrosis or obstructive uropathy. 2. Left parapelvic cysts on CT earlier today are not well demonstrated. Electronically Signed   By: Narda RutherfordMelanie  Sanford M.D.   On: 01/20/2020 21:32   CT L-SPINE NO CHARGE  Result Date: 01/20/2020 CLINICAL DATA:  Back pain after recent fall. EXAM: CT LUMBAR SPINE WITHOUT CONTRAST TECHNIQUE: Multidetector CT imaging of the lumbar spine was performed without intravenous contrast administration. Multiplanar CT image reconstructions were also generated. COMPARISON:  Lumbar spine plain films  01/13/2020 and previous CT 09/24/2015 FINDINGS: Segmentation: Normal. Alignment: Normal. Vertebrae: Mild spondylosis throughout the lumbar spine to include facet arthropathy over the lower lumbar spine. There is a mild compression fracture of L1 with air immediately under the superior endplate as this is unchanged from the recent plain films, although new since 2017. Presence of air suggest degree of osteonecrosis likely more of a subacute to chronic injury. There is no significant retropulsion of the posterior border of this compression fracture. No fragments within the spinal canal. Mild bilateral neural foraminal narrowing at the L3-4 L4-5 levels. No significant minimal degree of canal stenosis at the L4-5 level greater than the L3-4 level. Paraspinal and other soft tissues: Negative. Disc levels: Disc space narrowing at the L3-4 level unchanged. Minimal broad-based disc bulge at the L3-4 L4-5 levels without definite disc herniation. IMPRESSION: 1. Mild L1 compression fracture, age indeterminate. This fracture is new since 2017 and unchanged from 01/13/2020. Air under the superior endplate of L1 suggest a degree of osteonecrosis as the fracture is likely subacute to chronic in nature. 2. Mild spondylosis of the lumbar spine with disc disease at the L3-4  and L4-5 levels. Minimal canal stenosis at the L4-5 level greater than the L3-4 level. Electronically Signed   By: Elberta Fortis M.D.   On: 01/20/2020 16:35   DG Chest Portable 1 View  Result Date: 01/20/2020 CLINICAL DATA:  Cough and recent falls. EXAM: PORTABLE CHEST 1 VIEW COMPARISON:  01/13/2020 and prior radiographs FINDINGS: The cardiomediastinal silhouette is unremarkable. Streaky LEFT LOWER lung opacity noted and may represent pneumonia. There is no evidence of pulmonary edema, suspicious pulmonary nodule/mass, pleural effusion, or pneumothorax. No acute bony abnormalities are identified. IMPRESSION: Streaky LEFT LOWER lung opacity which may represent  pneumonia. Radiographic follow-up to resolution recommended. Electronically Signed   By: Harmon Pier M.D.   On: 01/20/2020 14:24    Pamella Pert, MD, PhD [facility]  Between 7 am - 7 pm I am available, please contact me via Amion or Securechat  Between 7 pm - 7 am I am not available, please contact night coverage MD/APP via Amion

## 2020-01-21 NOTE — ED Notes (Signed)
RN spoke to [facility] on 37M, she spoke with her charge RN and they do not feel comfortable accepting this pt with his blood pressures. Pt BP 98/44(62)after 2 L bolus. Floor coverage paged to notify.

## 2020-01-21 NOTE — Consult Note (Signed)
NAME:  Eric Berger, MRN:  [id number], DOB:  May 18, 1926, LOS: 1 ADMISSION DATE:  01/20/2020, CONSULTATION DATE:  01/21/20 REFERRING MD:  Elvera Lennox, CHIEF COMPLAINT:  cough   Brief History   86 year old man presenting with chronic cough found to have foreign body aspiration.  History of present illness   86 year old man with memory issues presenting with failure to thrive and worsneing cough.  Patient denies all symptoms to me at time of evaluation.  Seems pleasantly confused, history is per chart review.  Has some swallowing issues.   Past Medical History  Prior stroke CKD GERD HFPEF  Significant Hospital Events   01/20/20 admitted  Consults:  PCCM, PT/OT/SLP  Procedures:  N/A  Significant Diagnostic Tests:  CT Chest: IMPRESSION: 1. Left lower lobe consolidation without proximal bronchial occlusion, likely aspiration/pneumonia. 2. Linear calcific density within the right main stem bronchus extending into the right lower lobe bronchus. This is probably an aspirated foreign body, likely an animal bone. Chronicity is indeterminate. Bronchoscopy and retrieval should be considered. 3. Aortic Atherosclerosis (ICD10-I70.0).  Micro Data:  COVID neg Blood cx>>  Antimicrobials:  Ceftiraxone/azithromycin>>   Interim history/subjective:  Consulted  Objective   Blood pressure (!) 99/41, pulse 66, temperature (!) 97.5 F (36.4 C), temperature source Oral, resp. rate 17, height 5\' 5"  (1.651 m), weight 63.5 kg, SpO2 91 %.        Intake/Output Summary (Last 24 hours) at 01/21/2020 03/22/2020 Last data filed at 01/20/2020 1519 Gross per 24 hour  Intake 1000 ml  Output --  Net 1000 ml   Filed Weights   01/20/20 1057  Weight: 63.5 kg    Examination: General: elderly man in NAD HENT: MMM, trachea midline Lungs: Suprisingly clear, no wheezing Cardiovascular: RRR, ext warm Abdomen: Soft, +BS Extremities: No edema Neuro: globally weak Psych: AO to self  Resolved Hospital  Problem list   N/A  Assessment & Plan:  Aspirated foreign body in R mainstem LLL collapse Chronic cough Likely dementia  - Bronchoscopy today with attempted retrieval - Further recs pending findings - Discussed with son Ray over phone who consents to procedure  Labs   CBC: Recent Labs  Lab 01/20/20 1412 01/21/20 0405  WBC 14.3* 8.5  NEUTROABS 11.5*  --   HGB 13.9 11.0*  HCT 41.2 31.8*  MCV 94.3 94.1  PLT 280 292    Basic Metabolic Panel: Recent Labs  Lab 01/20/20 1412 01/21/20 0405  NA 137 140  K 4.3 2.9*  CL 95* 107  CO2 22 22  GLUCOSE 113* 89  BUN 57* 49*  CREATININE 1.47* 1.27*  CALCIUM 9.2 7.9*   GFR: Estimated Creatinine Clearance: 33 mL/min (A) (by C-G formula based on SCr of 1.27 mg/dL (H)). Recent Labs  Lab 01/20/20 1412 01/21/20 0405  WBC 14.3* 8.5    Liver Function Tests: Recent Labs  Lab 01/20/20 1412 01/21/20 0405  AST 45* 27  ALT 31 24  ALKPHOS 65 45  BILITOT 2.7* 0.8  PROT 7.1 5.4*  ALBUMIN 3.2* 2.2*   No results for input(s): LIPASE, AMYLASE in the last 168 hours. No results for input(s): AMMONIA in the last 168 hours.  ABG    Component Value Date/Time   PHART 7.471 (H) 09/30/2015 0921   PCO2ART 33.4 (L) 09/30/2015 0921   PO2ART 61.6 (L) 09/30/2015 0921   HCO3 24.3 (H) 09/30/2015 0921   TCO2 25.3 09/30/2015 0921   O2SAT 92.0 09/30/2015 0921     Coagulation Profile: No  results for input(s): INR, PROTIME in the last 168 hours.  Cardiac Enzymes: No results for input(s): CKTOTAL, CKMB, CKMBINDEX, TROPONINI in the last 168 hours.  HbA1C: Hgb A1c MFr Bld  Date/Time Value Ref Range Status  07/19/2009 02:49 PM  4.6 - 6.1 % Final   6.0 (NOTE) The ADA recommends the following therapeutic goal for glycemic control related to Hgb A1c measurement: Goal of therapy: <6.5 Hgb A1c  Reference: American Diabetes Association: Clinical Practice Recommendations 2010, Diabetes Care, 2010, 33: (Suppl  1).    CBG: Recent Labs  Lab  01/21/20 0732 01/21/20 0828  GLUCAP 61* 78    Review of Systems:   Denies all complaints to me  Positive Symptoms in bold:  Constitutional fevers, chills, weight loss, fatigue, anorexia, malaise  Eyes decreased vision, double vision, eye irritation  Ears, Nose, Mouth, Throat sore throat, trouble swallowing, sinus congestion  Cardiovascular chest pain, paroxysmal nocturnal dyspnea, lower ext edema, palpitations   Respiratory SOB, cough, DOE, hemoptysis, wheezing  Gastrointestinal nausea, vomiting, diarrhea  Genitourinary burning with urination, trouble urinating  Musculoskeletal joint aches, joint swelling, back pain  Integumentary  rashes, skin lesions  Neurological focal weakness, focal numbness, trouble speaking, headaches  Psychiatric depression, anxiety, confusion  Endocrine polyuria, polydipsia, cold intolerance, heat intolerance  Hematologic abnormal bruising, abnormal bleeding, unexplained nose bleeds  Allergic/Immunologic recurrent infections, hives, swollen lymph nodes     Past Medical History  He,  has a past medical history of Cataracts, bilateral, Chronic diastolic CHF (congestive heart failure) ([place]), GERD (gastroesophageal reflux disease), Hearing loss, Hernia, High cholesterol, Hypertension, Renal disorder, and Stroke ([place]).   Surgical History    Past Surgical History:  Procedure Laterality Date  . CHOLECYSTECTOMY    . EYE SURGERY    . HERNIA REPAIR    . HIP ARTHROPLASTY Right 04/05/2016   Procedure: RIGHT HIP HEMIARTHROPLASTY;  Surgeon: Renette Butters, MD;  Location: [facility];  Service: Orthopedics;  Laterality: Right;     Social History   reports that he quit smoking about 57 years ago. He has never used smokeless tobacco. He reports that he does not drink alcohol or use drugs.   Family History   His family history includes Diabetes Mellitus II in his brother.   Allergies No Known Allergies   Home Medications  Prior to Admission medications     Medication Sig Start Date End Date Taking? Authorizing Provider  aspirin EC 325 MG tablet Take 325 mg by mouth daily.   Yes [provider]  hydrochlorothiazide (HYDRODIURIL) 25 MG tablet TAKE 1 TABLET BY MOUTH EVERY DAY Patient taking differently: Take 25 mg by mouth daily.  12/05/19  Yes Susy Frizzle, MD  losartan (COZAAR) 100 MG tablet TAKE 1/2 TABLET BY MOUTH DAILY Patient taking differently: Take 50 mg by mouth daily.  05/31/19  Yes Susy Frizzle, MD  Multiple Vitamin (MULTIVITAMIN WITH MINERALS) TABS tablet Take 1 tablet by mouth daily. Centrum Silver   Yes [provider]  Saw Palmetto 450 MG CAPS Take 450 mg by mouth daily.    Yes [provider]  simvastatin (ZOCOR) 40 MG tablet 1 TABLET BY MOUTH ONCE DAILY AT 6PM. REQUIRES OFFICE VISIT BEFORE ANY FURTHER REFILLS CAN BE GIVEN Patient taking differently: Take 40 mg by mouth daily.  12/05/19  Yes Susy Frizzle, MD  Apoaequorin (PREVAGEN EXTRA STRENGTH PO) Take 1 tablet by mouth daily.    [provider]  aspirin 81 MG tablet Take 1 tablet (81 mg  total) by mouth daily. Patient not taking: Reported on 01/13/2020 03/02/18   Donita Brooks, MD

## 2020-01-21 NOTE — ED Notes (Signed)
Floor coverage paged to get new order for admission.

## 2020-01-21 NOTE — Evaluation (Signed)
Occupational Therapy Evaluation Patient Details Name: Eric Berger MRN: [id number] DOB: 02/02/1926 Today's Date: 01/21/2020    History of Present Illness Pt is a 86 yr old male who was admitted due to falls, decrease in appetitie, vomiting and increase in need for assistance. pmh but not limited to: CVA, CKD, HTN, HLD, GERD, HFpEF (reported, Last TTE was 2017 with no abnormalities), pneumonia 2 years ago   Clinical Impression   Pt admitted with see above. Pt currently with functional limitations due to the deficits listed below (see OT Problem List). Pt at this time unable to get a clear baseline of cognition as no family present. Pt reports at home they never got dressed and would just walk down the hall to the chair. Pt required moderate to max assist x2 with bed mobility from supine to sitting and sitting to standing max x2 to walker. Pt was able to only stand with BUE support and tolerated only a couple mins in standing position. Pt required sitting EOB moderate assist for UE dressing and supervision with face washing while sitting at EOB unsupportive. Pt will benefit from skilled OT to increase their safety and independence with ADL and functional mobility for ADL to facilitate discharge to venue listed below.       Follow Up Recommendations  SNF;Supervision/Assistance - 24 hour(depending on progress and PLOF function with family HH care)    Equipment Recommendations       Recommendations for Other Services       Precautions / Restrictions Precautions Precautions: Other (comment) Precaution Comments: monitor vitals      Mobility Bed Mobility Overal bed mobility: Needs Assistance Bed Mobility: Supine to Sit;Sit to Supine     Supine to sit: Mod assist;+2 for physical assistance;+2 for safety/equipment Sit to supine: Mod assist;+2 for physical assistance;+2 for safety/equipment      Transfers Overall transfer level: Needs assistance Equipment used: Rolling walker (2  wheeled) Transfers: Sit to/from Stand Sit to Stand: Max assist;+2 physical assistance;+2 safety/equipment;From elevated surface         General transfer comment: cues on safety     Balance Overall balance assessment: Mild deficits observed, not formally tested                                         ADL either performed or assessed with clinical judgement   ADL Overall ADL's : Needs assistance/impaired Eating/Feeding: Supervision/ safety;Sitting   Grooming: Wash/dry hands;Wash/dry face;Sitting;Supervision/safety   Upper Body Bathing: Moderate assistance;Sitting;Cueing for safety;Cueing for sequencing   Lower Body Bathing: Maximal assistance;Cueing for safety;Cueing for sequencing;Sit to/from stand;+2 for physical assistance;+2 for safety/equipment   Upper Body Dressing : Moderate assistance;Sitting;Cueing for safety;Cueing for sequencing   Lower Body Dressing: Maximal assistance;+2 for physical assistance;+2 for safety/equipment;Cueing for safety;Cueing for sequencing;Sit to/from stand   Toilet Transfer: Maximal assistance;+2 for physical assistance;+2 for safety/equipment;Cueing for safety;Cueing for sequencing;RW   Toileting- Clothing Manipulation and Hygiene: Maximal assistance;+2 for physical assistance;+2 for safety/equipment;Cueing for safety;Cueing for sequencing;Sit to/from stand   Tub/ Engineer, structural: Maximal assistance;+2 for physical assistance;+2 for safety/equipment   Functional mobility during ADLs: (due to BP unable to assess ambulation ) General ADL Comments: decrease ability to follow more then 1 step      Vision Baseline Vision/History: Wears glasses Patient Visual Report: No change from baseline Vision Assessment?: No apparent visual deficits     Perception Perception Perception Tested?:  No   Praxis Praxis Praxis tested?: Not tested    Pertinent Vitals/Pain Pain Assessment: No/denies pain Pain Score: 3  Faces Pain Scale: Hurts  a little bit Pain Location: back Pain Descriptors / Indicators: Grimacing Pain Intervention(s): Limited activity within patient's tolerance     Hand Dominance Right   Extremity/Trunk Assessment Upper Extremity Assessment Upper Extremity Assessment: LUE deficits/detail;RUE deficits/detail RUE Deficits / Details: decrease in coordination RUE Coordination: decreased fine motor LUE Deficits / Details: decrease in ROM   Lower Extremity Assessment Lower Extremity Assessment: Defer to PT evaluation       Communication Communication Communication: HOH   Cognition Arousal/Alertness: Awake/alert Behavior During Therapy: WFL for tasks assessed/performed Overall Cognitive Status: No family/caregiver present to determine baseline cognitive functioning                                     General Comments       Exercises     Shoulder Instructions      Home Living Family/patient expects to be discharged to:: Private residence Living Arrangements: Spouse/significant other Available Help at Discharge: Family(wife and daughter there 24/7) Type of Home: House Home Access: Stairs to enter Technical brewer of Steps: 3 Entrance Stairs-Rails: Right;Left;Can reach both Home Layout: One level     Bathroom Shower/Tub: Teacher, early years/pre: Standard Bathroom Accessibility: Yes How Accessible: Accessible via walker Home Equipment: Fithian - 2 wheels;Walker - 4 wheels;Shower seat;Grab bars - tub/shower   Additional Comments: pt had decrease in focusing and unsure if all information accurate      Prior Functioning/Environment Level of Independence: (unknown level at home)        Comments: per pt they do not get dressed and had help        OT Problem List: Decreased strength;Decreased activity tolerance;Impaired balance (sitting and/or standing);Decreased safety awareness;Decreased knowledge of use of DME or AE      OT Treatment/Interventions:  Self-care/ADL training;Therapeutic exercise;Neuromuscular education;DME and/or AE instruction;Balance training;Patient/family education;Cognitive remediation/compensation;Therapeutic activities    OT Goals(Current goals can be found in the care plan section) Acute Rehab OT Goals Patient Stated Goal: to rest OT Goal Formulation: With patient Time For Goal Achievement: 2020/02/19 Potential to Achieve Goals: Good ADL Goals Pt Will Perform Upper Body Dressing: with min guard assist;sitting Pt Will Perform Lower Body Dressing: with mod assist;sit to/from stand Pt Will Transfer to Toilet: with mod assist;ambulating;regular height toilet  OT Frequency: Min 2X/week   Barriers to D/C:            Co-evaluation PT/OT/SLP Co-Evaluation/Treatment: Yes Reason for Co-Treatment: Complexity of the patient's impairments (multi-system involvement);Necessary to address cognition/behavior during functional activity;For patient/therapist safety;To address functional/ADL transfers   OT goals addressed during session: ADL's and self-care      AM-PAC OT "6 Clicks" Daily Activity     Outcome Measure Help from another person eating meals?: A Little Help from another person taking care of personal grooming?: A Little Help from another person toileting, which includes using toliet, bedpan, or urinal?: A Lot Help from another person bathing (including washing, rinsing, drying)?: A Lot Help from another person to put on and taking off regular upper body clothing?: A Little Help from another person to put on and taking off regular lower body clothing?: A Lot 6 Click Score: 15   End of Session Equipment Utilized During Treatment: Gait belt;Rolling walker  Activity Tolerance: Patient limited by  fatigue Patient left: in bed;with call bell/phone within reach;with bed alarm set  OT Visit Diagnosis: Unsteadiness on feet (R26.81);Muscle weakness (generalized) (M62.81)                Time: 8502-7741 OT Time  Calculation (min): 39 min Charges:  OT General Charges $OT Visit: 1 Visit OT Evaluation $OT Eval Low Complexity: 1 Low OT Treatments $Self Care/Home Management : 8-22 mins  Alphia Moh OTR/L  Acute Rehab Services  [phone] office number [phone] pager number   Alphia Moh 01/21/2020, 12:44 PM

## 2020-01-21 NOTE — Evaluation (Signed)
Clinical/Bedside Swallow Evaluation Patient Details  Name: Eric Berger MRN: [id number] Date of Birth: 1926-05-17  Today's Date: 01/21/2020 Time: SLP Start Time (ACUTE ONLY): 0910 SLP Stop Time (ACUTE ONLY): 0955 SLP Time Calculation (min) (ACUTE ONLY): 45 min  Past Medical History:  Past Medical History:  Diagnosis Date  . Cataracts, bilateral   . Chronic diastolic CHF (congestive heart failure) ([place])   . GERD (gastroesophageal reflux disease)   . Hearing loss   . Hernia   . High cholesterol   . Hypertension   . Renal disorder   . Stroke [facility])    Past Surgical History:  Past Surgical History:  Procedure Laterality Date  . CHOLECYSTECTOMY    . EYE SURGERY    . HERNIA REPAIR    . HIP ARTHROPLASTY Right 04/05/2016   Procedure: RIGHT HIP HEMIARTHROPLASTY;  Surgeon: Renette Butters, MD;  Location: [facility];  Service: Orthopedics;  Laterality: Right;   HPI:  Eric Berger, 89y/m, presented to ED due to fall and weakness. PMH significant for previous stroke, CKD, HTN, HLD, GERD, dementia. He has had recent epidsodes of vomitting as well. Patient has a history of dysphagia requiring nectar and honey thickened liquids. Last MBS was 04/08/2016 and showed silent penetration and residuals. Honey thickened liquids was recommended at that time, but pt refused.    Assessment / Plan / Recommendation Clinical Impression  Clinical swallow evaluation completed at bedside by request of MD. A chest CT revealed:  1. Left lower lobe consolidation without proximal bronchial occlusion, likely aspiration/pneumonia. 2. Linear calcific density within the right main stem bronchus extending into the right lower lobe bronchus. This is probably an aspirated foreign body, likely an animal bone.  Plan for pt to have a brochoscopy and removal of foreign body on 01/22/20. Pt's Oral motor exam was unremarkable. He is edetulous, but has dentures at home. He reports self limiting his diet to soft  consistencies and not eating meats or breads. He also takse his medications with applesauce and small single sips of liquids. Observation of PO trials of water, applesauce and cracker noted complete mastication of cracker with no oral residue, small sips of liquid from cup and straw (pt would not take larger sips, even when prompted, he reported "I have to drink slow") and puree were all tolerated with no s/s of aspiration. His vocal quality remained clear, no coughing or clearing his throat.  Pt has a history of dysphagia, last MBS 04/08/2016 (penetration and residue with thin and nectar) honey thickened liquids recommended, but pt refused. He has been eating a regular diet with thin liquids up until the last few weeks and he has been self limiting what he eats to softer foods such as eggs.  Recommend regular diet (pt will make choices for softer foods) and thin liquids. Medication in purees. Pt will be NPO at midnight for bronchoscopy. ST will follow up with pt to evaluate need for instrumental exam.     Aspiration Risk    Mild to moderate   Diet Recommendation Regular;Thin liquid   Liquid Administration via: Straw Medication Administration: Whole meds with puree Supervision: Patient able to self feed Compensations: Slow rate;Small sips/bites Postural Changes: Seated upright at 90 degrees;Remain upright for at least 30 minutes after po intake    Other  Recommendations Oral Care Recommendations: Oral care BID   Follow up Recommendations Skilled Nursing facility      Frequency and Duration min 2x/week  2 weeks  Swallow Study   General Date of Onset: 01/20/20 HPI: Eric Berger, 89y/m, presented to ED due to fall and weakness. PMH significant for previous stroke, CKD, HTN, HLD, GERD, dementia. He has had recent epidsodes of vomitting as well. Patient has a history of dysphagia requiring nectar and honey thickened liquids. Last MBS was 04/08/2016 and showed silent  penetration and residuals. Honey thickened liquids was recommended at that time, but pt refused.  Type of Study: Bedside Swallow Evaluation Previous Swallow Assessment: 04/08/2016 Diet Prior to this Study: Regular;Thin liquids Temperature Spikes Noted: No Respiratory Status: Nasal cannula History of Recent Intubation: No Behavior/Cognition: Alert;Pleasant mood Oral Cavity Assessment: Within Functional Limits Oral Care Completed by SLP: Yes Oral Cavity - Dentition: Edentulous(pt reports dentures are at home) Vision: Functional for self-feeding Self-Feeding Abilities: Able to feed self Patient Positioning: Upright in bed Baseline Vocal Quality: Normal Volitional Cough: Congested Volitional Swallow: Unable to elicit    Oral/Motor/Sensory Function Overall Oral Motor/Sensory Function: Within functional limits   Ice Chips Ice chips: Within functional limits Presentation: Spoon   Thin Liquid Thin Liquid: Impaired Presentation: Cup;Straw Pharyngeal  Phase Impairments: Multiple swallows(pt takes only small sips) Other Comments: pt self limits to small sips only    Nectar Thick Nectar Thick Liquid: Not tested   Honey Thick Honey Thick Liquid: Not tested   Puree Puree: Within functional limits Presentation: Self Fed   Solid     Solid: Within functional limits Presentation: Self Fed      Eric Berger W., MA CCC-SLP 01/21/2020,10:19 AM

## 2020-01-21 NOTE — Progress Notes (Signed)
Patient ate breakfast, will do bronch tomorrow AM.

## 2020-01-22 ENCOUNTER — Encounter (HOSPITAL_COMMUNITY): Payer: Self-pay | Admitting: Internal Medicine

## 2020-01-22 ENCOUNTER — Inpatient Hospital Stay (HOSPITAL_COMMUNITY): Payer: Medicare HMO | Admitting: Certified Registered Nurse Anesthetist

## 2020-01-22 ENCOUNTER — Encounter (HOSPITAL_COMMUNITY)
Admission: EM | Disposition: A | Discharge status: Skilled Nursing Facility | Payer: Self-pay | Source: Home / Self Care | Attending: Internal Medicine

## 2020-01-22 DIAGNOSIS — T17900A Unspecified foreign body in respiratory tract, part unspecified causing asphyxiation, initial encounter: Secondary | ICD-10-CM | POA: Diagnosis not present

## 2020-01-22 DIAGNOSIS — J189 Pneumonia, unspecified organism: Secondary | ICD-10-CM | POA: Diagnosis not present

## 2020-01-22 DIAGNOSIS — D72829 Elevated white blood cell count, unspecified: Secondary | ICD-10-CM | POA: Diagnosis not present

## 2020-01-22 DIAGNOSIS — T17908A Unspecified foreign body in respiratory tract, part unspecified causing other injury, initial encounter: Secondary | ICD-10-CM

## 2020-01-22 DIAGNOSIS — I10 Essential (primary) hypertension: Secondary | ICD-10-CM | POA: Diagnosis not present

## 2020-01-22 DIAGNOSIS — N179 Acute kidney failure, unspecified: Secondary | ICD-10-CM | POA: Diagnosis not present

## 2020-01-22 HISTORY — PX: VIDEO BRONCHOSCOPY: SHX5072

## 2020-01-22 HISTORY — PX: BRONCHIAL WASHINGS: SHX5105

## 2020-01-22 HISTORY — PX: FOREIGN BODY REMOVAL: SHX962

## 2020-01-22 LAB — COMPREHENSIVE METABOLIC PANEL
ALT: 30 U/L (ref 0–44)
AST: 32 U/L (ref 15–41)
Albumin: 2.2 g/dL — ABNORMAL LOW (ref 3.5–5.0)
Alkaline Phosphatase: 55 U/L (ref 38–126)
Anion gap: 9 (ref 5–15)
BUN: 39 mg/dL — ABNORMAL HIGH (ref 8–23)
CO2: 23 mmol/L (ref 22–32)
Calcium: 8.2 mg/dL — ABNORMAL LOW (ref 8.9–10.3)
Chloride: 109 mmol/L (ref 98–111)
Creatinine, Ser: 1.18 mg/dL (ref 0.61–1.24)
GFR calc Af Amer: >60 mL/min (ref >60)
GFR calc non Af Amer: 54 mL/min — ABNORMAL LOW (ref >60)
Glucose, Bld: 96 mg/dL (ref 70–99)
Potassium: 3.1 mmol/L — ABNORMAL LOW (ref 3.5–5.1)
Sodium: 141 mmol/L (ref 135–145)
Total Bilirubin: 0.6 mg/dL (ref 0.3–1.2)
Total Protein: 5.4 g/dL — ABNORMAL LOW (ref 6.5–8.1)

## 2020-01-22 LAB — CBC
HCT: 32.1 % — ABNORMAL LOW (ref 39.0–52.0)
Hemoglobin: 10.8 g/dL — ABNORMAL LOW (ref 13.0–17.0)
MCH: 32 pg (ref 26.0–34.0)
MCHC: 33.6 g/dL (ref 30.0–36.0)
MCV: 95.3 fL (ref 80.0–100.0)
Platelets: 326 10*3/uL (ref 150–400)
RBC: 3.37 MIL/uL — ABNORMAL LOW (ref 4.22–5.81)
RDW: 13 % (ref 11.5–15.5)
WBC: 9.5 10*3/uL (ref 4.0–10.5)
nRBC: 0 % (ref 0.0–0.2)

## 2020-01-22 LAB — GLUCOSE, CAPILLARY: Glucose-Capillary: 98 mg/dL (ref 70–99)

## 2020-01-22 LAB — MAGNESIUM: Magnesium: 2 mg/dL (ref 1.7–2.4)

## 2020-01-22 SURGERY — BRONCHOSCOPY, WITH FLUOROSCOPY
Anesthesia: General

## 2020-01-22 MED ORDER — FENTANYL CITRATE (PF) 100 MCG/2ML IJ SOLN
INTRAMUSCULAR | Status: AC
  Filled 2020-01-22: qty 2

## 2020-01-22 MED ORDER — SUCCINYLCHOLINE CHLORIDE 20 MG/ML IJ SOLN
INTRAMUSCULAR | Status: DC | PRN
  Administered 2020-01-22: 80 mg via INTRAVENOUS

## 2020-01-22 MED ORDER — LACTATED RINGERS IV SOLN
INTRAVENOUS | Status: DC | PRN
Start: 2020-01-22 — End: 2020-01-22

## 2020-01-22 MED ORDER — ENOXAPARIN SODIUM 40 MG/0.4ML ~~LOC~~ SOLN
40.0000 mg | SUBCUTANEOUS | Status: DC
  Administered 2020-01-22 – 2020-01-23 (×2): 40 mg via SUBCUTANEOUS
  Filled 2020-01-22 (×2): qty 0.4

## 2020-01-22 MED ORDER — LIDOCAINE HCL (PF) 1 % IJ SOLN
INTRAMUSCULAR | Status: AC
  Filled 2020-01-22: qty 30

## 2020-01-22 MED ORDER — AMOXICILLIN-POT CLAVULANATE 875-125 MG PO TABS
1.0000 | ORAL_TABLET | Freq: Two times a day (BID) | ORAL | Status: DC
  Administered 2020-01-22 – 2020-01-24 (×6): 1 via ORAL
  Filled 2020-01-22 (×6): qty 1

## 2020-01-22 MED ORDER — DEXAMETHASONE SODIUM PHOSPHATE 10 MG/ML IJ SOLN
INTRAMUSCULAR | Status: DC | PRN
Start: 2020-01-22 — End: 2020-01-22
  Administered 2020-01-22: 10 mg via INTRAVENOUS

## 2020-01-22 MED ORDER — LIDOCAINE 2% (20 MG/ML) 5 ML SYRINGE
INTRAMUSCULAR | Status: DC | PRN
  Administered 2020-01-22: 100 mg via INTRAVENOUS

## 2020-01-22 MED ORDER — PHENYLEPHRINE 40 MCG/ML (10ML) SYRINGE FOR IV PUSH (FOR BLOOD PRESSURE SUPPORT)
PREFILLED_SYRINGE | INTRAVENOUS | Status: DC | PRN
  Administered 2020-01-22: 80 ug via INTRAVENOUS

## 2020-01-22 MED ORDER — POTASSIUM CHLORIDE 20 MEQ/15ML (10%) PO SOLN
40.0000 meq | ORAL | Status: AC
  Administered 2020-01-22 (×2): 40 meq via ORAL
  Filled 2020-01-22 (×2): qty 30

## 2020-01-22 MED ORDER — PROPOFOL 10 MG/ML IV BOLUS
INTRAVENOUS | Status: DC | PRN
  Administered 2020-01-22: 70 mg via INTRAVENOUS

## 2020-01-22 MED ORDER — SUGAMMADEX SODIUM 200 MG/2ML IV SOLN
INTRAVENOUS | Status: DC | PRN
  Administered 2020-01-22: 200 mg via INTRAVENOUS

## 2020-01-22 MED ORDER — PHENOL 1.4 % MT LIQD
1.0000 | OROMUCOSAL | Status: DC | PRN
  Administered 2020-01-22: 1 via OROMUCOSAL
  Filled 2020-01-22: qty 177

## 2020-01-22 MED ORDER — ROCURONIUM BROMIDE 100 MG/10ML IV SOLN
INTRAVENOUS | Status: DC | PRN
  Administered 2020-01-22: 40 mg via INTRAVENOUS

## 2020-01-22 MED ORDER — ONDANSETRON HCL 4 MG/2ML IJ SOLN
INTRAMUSCULAR | Status: DC | PRN
  Administered 2020-01-22: 4 mg via INTRAVENOUS

## 2020-01-22 NOTE — Anesthesia Preprocedure Evaluation (Signed)
Anesthesia Evaluation  Patient identified by MRN, date of birth, ID band Patient awake    Reviewed: Allergy & Precautions, NPO status , Patient's Chart, lab work & pertinent test results  Airway Mallampati: II  TM Distance: >3 FB Neck ROM: Full    Dental  (+) Edentulous Upper, Edentulous Lower   Pulmonary pneumonia, former smoker,    Pulmonary exam normal        Cardiovascular hypertension, Pt. on medications +CHF  Normal cardiovascular exam  ECHO 1/17 Study Conclusions  - Left ventricle: The cavity size was normal. Systolic function was normal. The estimated ejection fraction was in the range of 55% to 60%. Wall motion was normal; there were no regional wall motion abnormalities. - Aortic valve: Trileaflet; normal thickness, mildly calcified  leaflets. - Mitral valve: There was moderate regurgitation. - Left atrium: The atrium was moderately dilated. - Right atrium: The atrium was mildly dilated. - Tricuspid valve: There was mild-moderate regurgitation. - Pulmonary arteries: Systolic pressure was moderately increased. PA peak pressure: 56 mm Hg (S).    Neuro/Psych CVA, Residual Symptoms    GI/Hepatic GERD  Controlled,  Endo/Other    Renal/GU Renal disease     Musculoskeletal   Abdominal   Peds  Hematology   Anesthesia Other Findings Weakness on right side from CVA  Reproductive/Obstetrics                             Anesthesia Physical  Anesthesia Plan  ASA: III  Anesthesia Plan: General   Post-op Pain Management:    Induction: Intravenous  PONV Risk Score and Plan: 2 and Ondansetron and Dexamethasone  Airway Management Planned: Oral ETT  Additional Equipment: None  Intra-op Plan:   Post-operative Plan: Extubation in OR  Informed Consent: I have reviewed the patients History and Physical, chart, labs and discussed the procedure including the risks, benefits and  alternatives for the proposed anesthesia with the patient or authorized representative who has indicated his/her understanding and acceptance.     Dental advisory given  Plan Discussed with: CRNA  Anesthesia Plan Comments:         Anesthesia Quick Evaluation

## 2020-01-22 NOTE — Progress Notes (Signed)
01/22/20 S: Seen in f/u for abnormal CT.  Seen in endo room. Denies pain. Some lingering dry cough.  O: No acute distress MM dry. adentulous Lungs clear No ext edema  A: Foreign body aspiration: pea and chicken bone: removed LLL aspiration pneumonitis: BAL sent but suspect low yield  P: He needs to modify his home diet or else needs to be in hospice, I called and discussed with son Ulice Dash.  7 days augmentin  Will sign off, call if we can be of further help  Erskine Emery MD PCCM

## 2020-01-22 NOTE — Anesthesia Procedure Notes (Signed)
Procedure Name: Intubation Date/Time: 01/22/2020 9:49 AM Performed by: Candis Shine, CRNA Pre-anesthesia Checklist: Patient identified, Emergency Drugs available, Suction available and Patient being monitored Patient Re-evaluated:Patient Re-evaluated prior to induction Oxygen Delivery Method: Circle System Utilized Preoxygenation: Pre-oxygenation with 100% oxygen Induction Type: IV induction Ventilation: Mask ventilation without difficulty and Oral airway inserted - appropriate to patient size Laryngoscope Size: Mac and 3 Grade View: Grade I Tube type: Oral Tube size: 8.5 mm Number of attempts: 1 Airway Equipment and Method: Stylet and Oral airway Placement Confirmation: ETT inserted through vocal cords under direct vision,  positive ETCO2 and breath sounds checked- equal and bilateral Secured at: 22 cm Tube secured with: Tape Dental Injury: Teeth and Oropharynx as per pre-operative assessment

## 2020-01-22 NOTE — Op Note (Signed)
Bronchoscopy - with BAL - with foreign body removal  Indication: Foreign body aspiration  Consent: signed in chart  Anesthesia: general  Procedure - Timeout performed - Bronchoscope advanced through ETT - Airways examined down to subsegmental level - Following airway examination, diagnostic BAL in LLL and removal of chicken bone and pea from right airways (see images).  Pea removed with cryoprobe. Chicken bone removed en bloc with ETT  Findings - Chicken bone extending from lateral left trachea into R mainstem - Pea at bottom of chicken bone occluding bronchus intermedius - Some damage to right lower lobe at level of superior subsegment which along with appearance of chicken bone indicates it may have been there a while  Specimen(s): BAL LLL  Complications: None immediate   Pea and chicken bone removed from R mainstem

## 2020-01-22 NOTE — Progress Notes (Signed)
PROGRESS NOTE  AZHAR KNOPE ZMO:[id number] DOB: 10/12/1925 DOA: 01/20/2020 PCP: Donita Brooks, MD   LOS: 2 days   Brief Narrative / Interim history: 86 year old male with history of CVA, chronic kidney disease, HTN, HLD, heart failure with preserved EF with last TTE in 2017, came to the hospital with failure to thrive, falls, weakness.  For the past couple of weeks patient has been having poor appetite, nausea, vomiting and recurrent falls at home.  He has a history of having pneumonia about couple years ago and has had a chronic cough since.  He also had difficulty swallowing.  Subjective / 24h Interval events: No complaints this morning.  No chest pain, no shortness of breath  Assessment & Plan: Principal Problem Left lower lobe pneumonia, POA-possibly aspiration pneumonia, no evidence of proximal bronchial occlusion.  Patient was started on antibiotics with ceftriaxone and azithromycin, continue for now -Speech therapy evaluation plan recommends regular diet yesterday  Active Problems Aspirated foreign body within the right mainstem bronchus extending into the right lower lobe bronchus-this appears to be bone, pulmonology plans for bronchoscopy today  Hypokalemia -continue to replete  Acute kidney injury on chronic kidney disease stage II-baseline creatinine about 1.0-1.2, elevated to 1.5 on admission.  Received IV fluids and now normalized to 1.18.  Essential hypertension-continue to hold home medications periprocedure  Hyperlipidemia-continue home statin  Chronic diastolic CHF-appears stable and euvolemic this morning  Prior CVA, weakness-generalized weakness today.  Continue aspirin.  PT recommended SNF, will consult TOC  Recent follow-up L-spine fracture- no pain, supportive management right now  Scheduled Meds: . aspirin EC  325 mg Oral Daily  . azithromycin  250 mg Oral Daily  . enoxaparin (LOVENOX) injection  30 mg Subcutaneous Q24H  . multivitamin with  minerals  1 tablet Oral Daily  . simvastatin  40 mg Oral Daily   Continuous Infusions: . cefTRIAXone (ROCEPHIN)  IV 1 g (01/21/20 2141)   PRN Meds:.acetaminophen **OR** acetaminophen, metoprolol tartrate, promethazine  DVT prophylaxis: Lovenox Code Status: Full code Family Communication: Updated son over the phone on 5/1  Status is: Inpatient  Remains inpatient appropriate because:Ongoing diagnostic testing needed not appropriate for outpatient work up   Dispo: The patient is from: Home              Anticipated d/c is to: SNF              Anticipated d/c date is: 3 days              Patient currently is not medically stable to d/c.  Consultants:  Pumonary   Procedures:  None   Microbiology  None   Antimicrobials: Ceftriaxone / Azithromycin 4/30 >>    Objective: Vitals:   01/22/20 0054 01/22/20 0418 01/22/20 0500 01/22/20 0752  BP: (!) 108/53 113/60  108/61  Pulse: 72 70  70  Resp: 18 15  16   Temp: 98 F (36.7 C) (!) 97.5 F (36.4 C)  97.7 F (36.5 C)  TempSrc: Oral Oral  Oral  SpO2: 95% 98%  97%  Weight:   77.1 kg   Height:        Intake/Output Summary (Last 24 hours) at 01/22/2020 0903 Last data filed at 01/22/2020 0500 Gross per 24 hour  Intake 865 ml  Output 925 ml  Net -60 ml   Filed Weights   01/20/20 1057 01/22/20 0500  Weight: 63.5 kg 77.1 kg    Examination:  Constitutional: No distress Eyes: No icterus ENMT: Moist mucous  membranes Neck: normal, supple Respiratory: Clear bilaterally without wheezing or crackles Cardiovascular: Regular rate and rhythm, no murmurs appreciated.  Trace edema Abdomen: Soft, nontender, nondistended, positive bowel sounds Musculoskeletal: no clubbing / cyanosis.  Skin: No rashes seen Neurologic: Nonfocal  Data Reviewed: I have independently reviewed following labs and imaging studies   CBC: Recent Labs  Lab 01/20/20 1412 01/21/20 0405 01/22/20 0635  WBC 14.3* 8.5 9.5  NEUTROABS 11.5*  --   --   HGB  13.9 11.0* 10.8*  HCT 41.2 31.8* 32.1*  MCV 94.3 94.1 95.3  PLT 280 292 267   Basic Metabolic Panel: Recent Labs  Lab 01/20/20 1412 01/21/20 0405 01/22/20 0635  NA 137 140 141  K 4.3 2.9* 3.1*  CL 95* 107 109  CO2 22 22 23   GLUCOSE 113* 89 96  BUN 57* 49* 39*  CREATININE 1.47* 1.27* 1.18  CALCIUM 9.2 7.9* 8.2*  MG  --   --  2.0   Liver Function Tests: Recent Labs  Lab 01/20/20 1412 01/21/20 0405 01/22/20 0635  AST 45* 27 32  ALT 31 24 30   ALKPHOS 65 45 55  BILITOT 2.7* 0.8 0.6  PROT 7.1 5.4* 5.4*  ALBUMIN 3.2* 2.2* 2.2*   Coagulation Profile: No results for input(s): INR, PROTIME in the last 168 hours. HbA1C: No results for input(s): HGBA1C in the last 72 hours. CBG: Recent Labs  Lab 01/21/20 0732 01/21/20 0828 01/22/20 0626  GLUCAP 61* 78 98    Recent Results (from the past 240 hour(s))  Respiratory Panel by RT PCR (Flu A&B, Covid) - Nasopharyngeal Swab     Status: None   Collection Time: 01/20/20  4:16 PM   Specimen: Nasopharyngeal Swab  Result Value Ref Range Status   SARS Coronavirus 2 by RT PCR NEGATIVE NEGATIVE Final    Comment: (NOTE) SARS-CoV-2 target nucleic acids are NOT DETECTED. The SARS-CoV-2 RNA is generally detectable in upper respiratoy specimens during the acute phase of infection. The lowest concentration of SARS-CoV-2 viral copies this assay can detect is 131 copies/mL. A negative result does not preclude SARS-Cov-2 infection and should not be used as the sole basis for treatment or other patient management decisions. A negative result may occur with  improper specimen collection/handling, submission of specimen other than nasopharyngeal swab, presence of viral mutation(s) within the areas targeted by this assay, and inadequate number of viral copies (<131 copies/mL). A negative result must be combined with clinical observations, patient history, and epidemiological information. The expected result is Negative. Fact Sheet for  Patients:  PinkCheek.be Fact Sheet for Healthcare Providers:  GravelBags.it This test is not yet ap proved or cleared by the [place] FDA and  has been authorized for detection and/or diagnosis of SARS-CoV-2 by FDA under an Emergency Use Authorization (EUA). This EUA will remain  in effect (meaning this test can be used) for the duration of the COVID-19 declaration under Section 564(b)(1) of the Act, 21 U.S.C. section 360bbb-3(b)(1), unless the authorization is terminated or revoked sooner.    Influenza A by PCR NEGATIVE NEGATIVE Final   Influenza B by PCR NEGATIVE NEGATIVE Final    Comment: (NOTE) The Xpert Xpress SARS-CoV-2/FLU/RSV assay is intended as an aid in  the diagnosis of influenza from Nasopharyngeal swab specimens and  should not be used as a sole basis for treatment. Nasal washings and  aspirates are unacceptable for Xpert Xpress SARS-CoV-2/FLU/RSV  testing. Fact Sheet for Patients: PinkCheek.be Fact Sheet for Healthcare Providers: GravelBags.it This test is not  yet approved or cleared by the [place] and  has been authorized for detection and/or diagnosis of SARS-CoV-2 by  FDA under an Emergency Use Authorization (EUA). This EUA will remain  in effect (meaning this test can be used) for the duration of the  Covid-19 declaration under Section 564(b)(1) of the Act, 21  U.S.C. section 360bbb-3(b)(1), unless the authorization is  terminated or revoked. Performed at [facility], [address], [place], [place] [address]   Blood culture (routine x 2)     Status: None (Preliminary result)   Collection Time: 01/20/20  6:47 PM   Specimen: BLOOD  Result Value Ref Range Status   Specimen Description BLOOD SITE NOT SPECIFIED  Final   Special Requests   Final    BOTTLES DRAWN AEROBIC AND ANAEROBIC Blood Culture adequate volume   Culture    Final    NO GROWTH 2 DAYS Performed at [facility], [address] [place], [place] [address]    Report Status PENDING  Incomplete  Blood culture (routine x 2)     Status: None (Preliminary result)   Collection Time: 01/21/20  4:05 AM   Specimen: BLOOD  Result Value Ref Range Status   Specimen Description BLOOD RIGHT ANTECUBITAL  Final   Special Requests   Final    BOTTLES DRAWN AEROBIC AND ANAEROBIC Blood Culture adequate volume   Culture   Final    NO GROWTH 1 DAY Performed at [facility], [address], [place], [place] [address]    Report Status PENDING  Incomplete     Radiology Studies: No results found.  Pamella Pert, MD, PhD [facility]  Between 7 am - 7 pm I am available, please contact me via Amion or Securechat  Between 7 pm - 7 am I am not available, please contact night coverage MD/APP via Amion

## 2020-01-22 NOTE — Transfer of Care (Signed)
Immediate Anesthesia Transfer of Care Note  Patient: Eric Berger  Procedure(s) Performed: VIDEO BRONCHOSCOPY WITH FLUORO (N/A ) FOREIGN BODY REMOVAL BRONCHIAL WASHINGS  Patient Location: PACU  Anesthesia Type:General  Level of Consciousness: awake and alert   Airway & Oxygen Therapy: Patient Spontanous Breathing and Patient connected to face mask oxygen  Post-op Assessment: Report given to RN and Post -op Vital signs reviewed and stable  Post vital signs: Reviewed and stable  Last Vitals:  Vitals Value Taken Time  BP 118/51 01/22/20 1026  Temp    Pulse 76 01/22/20 1026  Resp 11 01/22/20 1026  SpO2 98 % 01/22/20 1026  Vitals shown include unvalidated device data.  Last Pain:  Vitals:   01/22/20 0918  TempSrc: Oral  PainSc: 0-No pain      Patients Stated Pain Goal: 0 (01/21/20 2145)  Complications: No apparent anesthesia complications

## 2020-01-22 NOTE — Anesthesia Postprocedure Evaluation (Signed)
Anesthesia Post Note  Patient: Eric Berger  Procedure(s) Performed: VIDEO BRONCHOSCOPY WITH FLUORO (N/A ) FOREIGN BODY REMOVAL BRONCHIAL WASHINGS     Patient location during evaluation: PACU Anesthesia Type: General Level of consciousness: sedated and patient cooperative Pain management: pain level controlled Vital Signs Assessment: post-procedure vital signs reviewed and stable Respiratory status: spontaneous breathing Cardiovascular status: stable Anesthetic complications: no    Last Vitals:  Vitals:   01/22/20 1056 01/22/20 1259  BP: (!) 111/41 (!) 118/52  Pulse: 72 72  Resp: 18 18  Temp: 36.5 C (!) 36.4 C  SpO2: 97% 95%    Last Pain:  Vitals:   01/22/20 1259  TempSrc: Oral  PainSc:                  Lewie Loron

## 2020-01-23 DIAGNOSIS — D72829 Elevated white blood cell count, unspecified: Secondary | ICD-10-CM | POA: Diagnosis not present

## 2020-01-23 DIAGNOSIS — J189 Pneumonia, unspecified organism: Secondary | ICD-10-CM | POA: Diagnosis not present

## 2020-01-23 DIAGNOSIS — I10 Essential (primary) hypertension: Secondary | ICD-10-CM | POA: Diagnosis not present

## 2020-01-23 DIAGNOSIS — N179 Acute kidney failure, unspecified: Secondary | ICD-10-CM | POA: Diagnosis not present

## 2020-01-23 LAB — CBC
HCT: 32.2 % — ABNORMAL LOW (ref 39.0–52.0)
Hemoglobin: 10.8 g/dL — ABNORMAL LOW (ref 13.0–17.0)
MCH: 32.4 pg (ref 26.0–34.0)
MCHC: 33.5 g/dL (ref 30.0–36.0)
MCV: 96.7 fL (ref 80.0–100.0)
Platelets: 328 10*3/uL (ref 150–400)
RBC: 3.33 MIL/uL — ABNORMAL LOW (ref 4.22–5.81)
RDW: 13 % (ref 11.5–15.5)
WBC: 19 10*3/uL — ABNORMAL HIGH (ref 4.0–10.5)
nRBC: 0 % (ref 0.0–0.2)

## 2020-01-23 LAB — BASIC METABOLIC PANEL
Anion gap: 7 (ref 5–15)
BUN: 32 mg/dL — ABNORMAL HIGH (ref 8–23)
CO2: 25 mmol/L (ref 22–32)
Calcium: 8.5 mg/dL — ABNORMAL LOW (ref 8.9–10.3)
Chloride: 111 mmol/L (ref 98–111)
Creatinine, Ser: 1.18 mg/dL (ref 0.61–1.24)
GFR calc Af Amer: >60 mL/min (ref >60)
GFR calc non Af Amer: 54 mL/min — ABNORMAL LOW (ref >60)
Glucose, Bld: 127 mg/dL — ABNORMAL HIGH (ref 70–99)
Potassium: 4.8 mmol/L (ref 3.5–5.1)
Sodium: 143 mmol/L (ref 135–145)

## 2020-01-23 LAB — GLUCOSE, CAPILLARY
Glucose-Capillary: 131 mg/dL — ABNORMAL HIGH (ref 70–99)
Glucose-Capillary: 169 mg/dL — ABNORMAL HIGH (ref 70–99)

## 2020-01-23 LAB — SARS CORONAVIRUS 2 (TAT 6-24 HRS): SARS Coronavirus 2: NEGATIVE

## 2020-01-23 NOTE — Progress Notes (Signed)
PROGRESS NOTE  Eric Berger WCH:[id number] DOB: 26-Oct-1925 DOA: 01/20/2020 PCP: Susy Frizzle, MD   LOS: 3 days   Brief Narrative / Interim history: 86 year old male with history of CVA, chronic kidney disease, HTN, HLD, heart failure with preserved EF with last TTE in 2017, came to the hospital with failure to thrive, falls, weakness.  For the past couple of weeks patient has been having poor appetite, nausea, vomiting and recurrent falls at home.  He has a history of having pneumonia about couple years ago and has had a chronic cough since.  He also had difficulty swallowing.  Subjective / 24h Interval events: Feeling well, eating breakfast  Assessment & Plan: Principal Problem Left lower lobe pneumonia, POA-possibly aspiration pneumonia, no evidence of proximal bronchial occlusion.  Patient was started on antibiotics with ceftriaxone and azithromycin, continue for now -Speech therapy recommended regular diet  Active Problems Aspirated foreign body within the right mainstem bronchus extending into the right lower lobe bronchus-status post bronchoscopy on 5/2, chicken bone as well as a PE was extracted.  Patient with stable respiratory status today however his white count has jumped to 19 K from normal values yesterday.  While this is likely post bronchoscopy as he has BAL, monitor over the next 24 hours and if he remains afebrile and leukocytosis stable can potentially go home tomorrow  Hypokalemia -potassium normalized  Acute kidney injury on chronic kidney disease stage II-baseline creatinine about 1.0-1.2, elevated to 1.5 on admission.  Creatinine remains stable today  Essential hypertension-continue to hold home medications periprocedure  Hyperlipidemia-continue home statin  Chronic diastolic CHF-appears stable and euvolemic this morning  Prior CVA, weakness-generalized weakness today.  Continue aspirin.  PT recommended SNF, will consult TOC  Recent follow-up L-spine  fracture- no pain, supportive management right now  Scheduled Meds: . amoxicillin-clavulanate  1 tablet Oral Q12H  . aspirin EC  325 mg Oral Daily  . enoxaparin (LOVENOX) injection  40 mg Subcutaneous Q24H  . multivitamin with minerals  1 tablet Oral Daily  . simvastatin  40 mg Oral Daily   Continuous Infusions:  PRN Meds:.acetaminophen **OR** acetaminophen, metoprolol tartrate, phenol, promethazine  DVT prophylaxis: Lovenox Code Status: Full code Family Communication: will update son in the afternoon  Status is: Inpatient  Remains inpatient appropriate because:Increase leukocytosis, monitor and home vs SNF tomorrow  Dispo: The patient is from: Home              Anticipated d/c is to: SNF              Anticipated d/c date is: 1 day              Patient currently is not medically stable to d/c.  Consultants:  Pumonary   Procedures:  None   Microbiology  None   Antimicrobials: Ceftriaxone / Azithromycin 4/30 >> 5/2 Augmentin 5/2 >>   Objective: Vitals:   01/22/20 1259 01/22/20 1953 01/23/20 0431 01/23/20 0900  BP: (!) 118/52 (!) 115/52  (!) 122/56  Pulse: 72 74  70  Resp: _0 Temp: (!) 97.5 F (36.4 C) (!) 97.4 F (36.3 C)  (!) 97.4 F (36.3 C)  TempSrc: Oral Oral  Oral  SpO2: 95% 94%  97%  Weight:   75.8 kg   Height:        Intake/Output Summary (Last 24 hours) at 01/23/2020 0943 Last data filed at 01/23/2020 0432 Gross per 24 hour  Intake 300 ml  Output 900 ml  Net -  600 ml   Filed Weights   01/22/20 0500 01/22/20 0918 01/23/20 0431  Weight: 77.1 kg 77.1 kg 75.8 kg    Examination:  Constitutional: NAD, eating breakfast Eyes: No scleral icterus ENMT: mmm Neck: normal, supple Respiratory: Clear bilaterally, no wheezing Cardiovascular: Regular rate and rhythm, no murmurs, trace edema Abdomen: Soft, nontender, nondistended, bowel sounds positive Musculoskeletal: no clubbing / cyanosis.  Skin: No rashes Neurologic: No focal deficits  Data  Reviewed: I have independently reviewed following labs and imaging studies   CBC: Recent Labs  Lab 01/20/20 1412 01/21/20 0405 01/22/20 0635 01/23/20 0622  WBC 14.3* 8.5 9.5 19.0*  NEUTROABS 11.5*  --   --   --   HGB 13.9 11.0* 10.8* 10.8*  HCT 41.2 31.8* 32.1* 32.2*  MCV 94.3 94.1 95.3 96.7  PLT 280 292 326 510   Basic Metabolic Panel: Recent Labs  Lab 01/20/20 1412 01/21/20 0405 01/22/20 0635 01/23/20 0622  NA 137 140 141 143  K 4.3 2.9* 3.1* 4.8  CL 95* 107 109 111  CO2 _0 GLUCOSE 113* 89 96 127*  BUN 57* 49* 39* 32*  CREATININE 1.47* 1.27* 1.18 1.18  CALCIUM 9.2 7.9* 8.2* 8.5*  MG  --   --  2.0  --    Liver Function Tests: Recent Labs  Lab 01/20/20 1412 01/21/20 0405 01/22/20 0635  AST 45* 27 32  ALT _1 ALKPHOS 65 45 55  BILITOT 2.7* 0.8 0.6  PROT 7.1 5.4* 5.4*  ALBUMIN 3.2* 2.2* 2.2*   Coagulation Profile: No results for input(s): INR, PROTIME in the last 168 hours. HbA1C: No results for input(s): HGBA1C in the last 72 hours. CBG: Recent Labs  Lab 01/21/20 0732 01/21/20 0828 01/22/20 0626 01/23/20 0614  GLUCAP 61* 78 98 131*    Recent Results (from the past 240 hour(s))  Respiratory Panel by RT PCR (Flu A&B, Covid) - Nasopharyngeal Swab     Status: None   Collection Time: 01/20/20  4:16 PM   Specimen: Nasopharyngeal Swab  Result Value Ref Range Status   SARS Coronavirus 2 by RT PCR NEGATIVE NEGATIVE Final    Comment: (NOTE) SARS-CoV-2 target nucleic acids are NOT DETECTED. The SARS-CoV-2 RNA is generally detectable in upper respiratoy specimens during the acute phase of infection. The lowest concentration of SARS-CoV-2 viral copies this assay can detect is 131 copies/mL. A negative result does not preclude SARS-Cov-2 infection and should not be used as the sole basis for treatment or other patient management decisions. A negative result may occur with  improper specimen collection/handling, submission of specimen  other than nasopharyngeal swab, presence of viral mutation(s) within the areas targeted by this assay, and inadequate number of viral copies (<131 copies/mL). A negative result must be combined with clinical observations, patient history, and epidemiological information. The expected result is Negative. Fact Sheet for Patients:  PinkCheek.be Fact Sheet for Healthcare Providers:  GravelBags.it This test is not yet ap proved or cleared by the [place] FDA and  has been authorized for detection and/or diagnosis of SARS-CoV-2 by FDA under an Emergency Use Authorization (EUA). This EUA will remain  in effect (meaning this test can be used) for the duration of the COVID-19 declaration under Section 564(b)(1) of the Act, 21 U.S.C. section 360bbb-3(b)(1), unless the authorization is terminated or revoked sooner.    Influenza A by PCR NEGATIVE NEGATIVE Final   Influenza B by PCR NEGATIVE NEGATIVE Final    Comment: (NOTE) The  Xpert Xpress SARS-CoV-2/FLU/RSV assay is intended as an aid in  the diagnosis of influenza from Nasopharyngeal swab specimens and  should not be used as a sole basis for treatment. Nasal washings and  aspirates are unacceptable for Xpert Xpress SARS-CoV-2/FLU/RSV  testing. Fact Sheet for Patients: PinkCheek.be Fact Sheet for Healthcare Providers: GravelBags.it This test is not yet approved or cleared by the [place] FDA and  has been authorized for detection and/or diagnosis of SARS-CoV-2 by  FDA under an Emergency Use Authorization (EUA). This EUA will remain  in effect (meaning this test can be used) for the duration of the  Covid-19 declaration under Section 564(b)(1) of the Act, 21  U.S.C. section 360bbb-3(b)(1), unless the authorization is  terminated or revoked. Performed at [facility], [place] [address] [place], [place] [address]    Blood culture (routine x 2)     Status: None (Preliminary result)   Collection Time: 01/20/20  6:47 PM   Specimen: BLOOD  Result Value Ref Range Status   Specimen Description BLOOD SITE NOT SPECIFIED  Final   Special Requests   Final    BOTTLES DRAWN AEROBIC AND ANAEROBIC Blood Culture adequate volume   Culture   Final    NO GROWTH 3 DAYS Performed at [facility], [address] [place], [place] [address]    Report Status PENDING  Incomplete  Blood culture (routine x 2)     Status: None (Preliminary result)   Collection Time: 01/21/20  4:05 AM   Specimen: BLOOD  Result Value Ref Range Status   Specimen Description BLOOD RIGHT ANTECUBITAL  Final   Special Requests   Final    BOTTLES DRAWN AEROBIC AND ANAEROBIC Blood Culture adequate volume   Culture   Final    NO GROWTH 2 DAYS Performed at [facility], [place] [address], [place], [place] [address]    Report Status PENDING  Incomplete  Culture, bal-quantitative     Status: Abnormal (Preliminary result)   Collection Time: 01/22/20 10:09 AM   Specimen: Bronchoalveolar Lavage  Result Value Ref Range Status   Specimen Description BRONCHIAL ALVEOLAR LAVAGE  Final   Special Requests LEFT LUNG  Final   Gram Stain   Final    MODERATE WBC PRESENT, PREDOMINANTLY MONONUCLEAR NO ORGANISMS SEEN Performed at [facility], [place] [address] [place], [place] [address]    Culture 20,000 COLONIES/mL ESCHERICHIA COLI (A)  Final   Report Status PENDING  Incomplete     Radiology Studies: No results found.  Marzetta Board, MD, PhD [facility]  Between 7 am - 7 pm I am available, please contact me via Amion or Securechat  Between 7 pm - 7 am I am not available, please contact night coverage MD/APP via Amion

## 2020-01-23 NOTE — NC FL2 (Signed)
[place] LEVEL OF CARE SCREENING TOOL     IDENTIFICATION  Patient Name: Eric Berger Birthdate: [birth year] Sex: male Admission Date (Current Location): 01/20/2020  [facility] and [place] Number:  Herbalist and Address:  [facility], [place] [address] [place], [place] [address]      Provider Number: [id number]  Attending Physician Name and Address:  Caren Griffins, MD  Relative Name and Phone Number:  Ulice Dash ([phone]    Current Level of Care: Hospital Recommended Level of Care: [place] Prior Approval Number:    Date Approved/Denied:   PASRR Number: [id number]  Discharge Plan: SNF    Current Diagnoses: Patient Active Problem List   Diagnosis Date Noted  . Foreign body in airway   . AKI (acute kidney injury) ([place]) 01/20/2020  . Closed right hip fracture ([place]) 04/04/2016  . Nausea & vomiting 04/04/2016  . Stroke ([place])   . High cholesterol   . Chronic diastolic CHF (congestive heart failure) ([place])   . Dysphagia 11/23/2015  . Mitral regurgitation 09/29/2015  . CAP (community acquired pneumonia) 09/23/2015  . ARF (acute renal failure) ([place]) 09/23/2015  . HTN (hypertension) 11/07/2014  . Encounter for long-term (current) use of other high-risk medications 11/07/2014  . G E R D 12/02/2007  . ASPIRATION FOREIGN BODY 12/02/2007    Orientation RESPIRATION BLADDER Height & Weight     Self, Place  Normal Incontinent, External catheter Weight: 167 lb (75.8 kg) Height:  5\' 5"  (165.1 cm)  BEHAVIORAL SYMPTOMS/MOOD NEUROLOGICAL BOWEL NUTRITION STATUS      Incontinent Diet(see discharge summary)  AMBULATORY STATUS COMMUNICATION OF NEEDS Skin   Extensive Assist Verbally Normal                       Personal Care Assistance Level of Assistance  Bathing, Dressing, Feeding, Total care Bathing Assistance: Limited assistance Feeding assistance: Independent Dressing Assistance: Limited  assistance Total Care Assistance: Maximum assistance   Functional Limitations Info  Sight, Hearing, Speech Sight Info: Adequate Hearing Info: Impaired Speech Info: Adequate    SPECIAL CARE FACTORS FREQUENCY  PT (By licensed PT), OT (By licensed OT)     PT Frequency: min 5x weekly OT Frequency: min 5x weekly            Contractures Contractures Info: Not present    Additional Factors Info  Code Status, Allergies Code Status Info: full Allergies Info: No Known Allergies           Current Medications (01/23/2020):  This is the current hospital active medication list Current Facility-Administered Medications  Medication Dose Route Frequency Provider Last Rate Last Admin  . acetaminophen (TYLENOL) tablet 650 mg  650 mg Oral Q6H PRN Gilles Chiquito B, MD   650 mg at 01/21/20 2145   Or  . acetaminophen (TYLENOL) suppository 650 mg  650 mg Rectal Q6H PRN Sid Falcon, MD      . amoxicillin-clavulanate (AUGMENTIN) 875-125 MG per tablet 1 tablet  1 tablet Oral Q12H Candee Furbish, MD   1 tablet at 01/23/20 [phone]  . aspirin EC tablet 325 mg  325 mg Oral Daily Gilles Chiquito B, MD   325 mg at 01/23/20 0857  . enoxaparin (LOVENOX) injection 40 mg  40 mg Subcutaneous Q24H Caren Griffins, MD   40 mg at 01/22/20 2053  . metoprolol tartrate (LOPRESSOR) injection 5 mg  5 mg Intravenous Q6H PRN Sid Falcon, MD      .  multivitamin with minerals tablet 1 tablet  1 tablet Oral Daily Inez Catalina, MD   1 tablet at 01/23/20 0857  . phenol (CHLORASEPTIC) mouth spray 1 spray  1 spray Mouth/Throat PRN Leatha Gilding, MD   1 spray at 01/22/20 1149  . promethazine (PHENERGAN) tablet 12.5 mg  12.5 mg Oral Q6H PRN Debe Coder B, MD      . simvastatin (ZOCOR) tablet 40 mg  40 mg Oral Daily Inez Catalina, MD   40 mg at 01/22/20 1714     Discharge Medications: Please see discharge summary for a list of discharge medications.  Relevant Imaging Results:  Relevant Lab  Results:   Additional Information SSN: [id number]  Gildardo Griffes, LCSW

## 2020-01-23 NOTE — Plan of Care (Signed)

## 2020-01-23 NOTE — TOC Initial Note (Addendum)
Transition of Care [facility]) - Initial/Assessment Note    Patient Details  Name: Eric Berger MRN: [id number] Date of Birth: 1926/01/25  Transition of Care [facility]) CM/SW Contact:    Alberteen Sam, LCSW Phone Number: 01/23/2020, 10:06 AM  Clinical Narrative:                  Update 1:07 pm: CSW spoke with Ulice Dash who reports choosing [facility], CSW has notified Olivia Mackie at [facility] of bed choice and requested [employer] [facility] auth be started today for patient to dc to them tomorrow.   Update 11:26 - CSW lvm with Ulice Dash to inform no beds at [facility] and gave Ulice Dash 3 bed offers currently of [facility] [place]. Pending call back with choice. Insurance will need to be started at facility choice.   CSW spoke with patient's son Ulice Dash regarding Skilled Nursing recommendation, he is in agreement with this plan at time of discharge with preference for [facility] as patient had gone there several years ago.   CSW has faxed referral to [facility] pending bed offer at this time.   Expected Discharge Plan: Skilled Nursing Facility Barriers to Discharge: Continued Medical Work up   Patient Goals and CMS Choice   CMS Medicare.gov Compare Post Acute Care list provided to:: Patient Represenative (must comment)(son Ulice Dash) Choice offered to / list presented to : Adult Children  Expected Discharge Plan and Services Expected Discharge Plan: [place] Acute Care Choice: [place] Living arrangements for the past 2 months: Single Family Home                                      Prior Living Arrangements/Services Living arrangements for the past 2 months: Single Family Home Lives with:: Self Patient language and need for interpreter reviewed:: Yes Do you feel safe going back to the place where you live?: No   needs short term rehab  Need for Family Participation in Patient Care: Yes (Comment) Care giver support system in place?: Yes  (comment)   Criminal Activity/Legal Involvement Pertinent to Current Situation/Hospitalization: No - Comment as needed  Activities of Daily Living Home Assistive Devices/Equipment: Cane (specify quad or straight), Walker (specify type) ADL Screening (condition at time of admission) Patient's cognitive ability adequate to safely complete daily activities?: Yes Is the patient deaf or have difficulty hearing?: Yes Does the patient have difficulty seeing, even when wearing glasses/contacts?: No Does the patient have difficulty concentrating, remembering, or making decisions?: Yes Patient able to express need for assistance with ADLs?: Yes Does the patient have difficulty dressing or bathing?: Yes Independently performs ADLs?: No Communication: Independent Dressing (OT): Needs assistance Is this a change from baseline?: Pre-admission baseline Grooming: Needs assistance Is this a change from baseline?: Pre-admission baseline Feeding: Independent Bathing: Needs assistance Is this a change from baseline?: Pre-admission baseline Toileting: Dependent Is this a change from baseline?: Pre-admission baseline In/Out Bed: Needs assistance Is this a change from baseline?: Pre-admission baseline Walks in Home: Needs assistance Is this a change from baseline?: Pre-admission baseline Does the patient have difficulty walking or climbing stairs?: Yes Weakness of Legs: Right Weakness of Arms/Hands: None  Permission Sought/Granted Permission sought to share information with : Case Manager, Customer service manager, Family Supports Permission granted to share information with : Yes, Verbal Permission Granted  Share Information with NAME: Ulice Dash  Permission granted to  share info w AGENCY: SNFs  Permission granted to share info w Relationship: son  Permission granted to share info w Contact Information: [phone]  Emotional Assessment Appearance:: Appears stated age Attitude/Demeanor/Rapport:  Gracious Affect (typically observed): Calm Orientation: : Oriented to Self, Oriented to Place Alcohol / Substance Use: Not Applicable Psych Involvement: No (comment)  Admission diagnosis:  Poor appetite [R63.0] Trauma [T14.90XA] Renal failure [N19] AKI (acute kidney injury) (HCC) [N17.9] Leukocytosis, unspecified type [D72.829] Community acquired pneumonia of left lower lobe of lung [J18.9] Patient Active Problem List   Diagnosis Date Noted  . Foreign body in airway   . AKI (acute kidney injury) (HCC) 01/20/2020  . Closed right hip fracture (HCC) 04/04/2016  . Nausea & vomiting 04/04/2016  . Stroke (HCC)   . High cholesterol   . Chronic diastolic CHF (congestive heart failure) (HCC)   . Dysphagia 11/23/2015  . Mitral regurgitation 09/29/2015  . CAP (community acquired pneumonia) 09/23/2015  . ARF (acute renal failure) (HCC) 09/23/2015  . HTN (hypertension) 11/07/2014  . Encounter for long-term (current) use of other high-risk medications 11/07/2014  . G E R D 12/02/2007  . ASPIRATION FOREIGN BODY 12/02/2007   PCP:  Donita Brooks, MD Pharmacy:   [facility] [address], [place] - [address] [place] [address] Phone: [phone] Fax: [phone]     Social Determinants of Health (SDOH) Interventions    Readmission Risk Interventions No flowsheet data found.

## 2020-01-24 DIAGNOSIS — Z7401 Bed confinement status: Secondary | ICD-10-CM | POA: Diagnosis not present

## 2020-01-24 DIAGNOSIS — I130 Hypertensive heart and chronic kidney disease with heart failure and stage 1 through stage 4 chronic kidney disease, or unspecified chronic kidney disease: Secondary | ICD-10-CM | POA: Diagnosis not present

## 2020-01-24 DIAGNOSIS — M2550 Pain in unspecified joint: Secondary | ICD-10-CM | POA: Diagnosis not present

## 2020-01-24 DIAGNOSIS — N390 Urinary tract infection, site not specified: Secondary | ICD-10-CM | POA: Diagnosis not present

## 2020-01-24 DIAGNOSIS — R627 Adult failure to thrive: Secondary | ICD-10-CM | POA: Diagnosis not present

## 2020-01-24 DIAGNOSIS — I10 Essential (primary) hypertension: Secondary | ICD-10-CM | POA: Diagnosis not present

## 2020-01-24 DIAGNOSIS — M6281 Muscle weakness (generalized): Secondary | ICD-10-CM | POA: Diagnosis not present

## 2020-01-24 DIAGNOSIS — N182 Chronic kidney disease, stage 2 (mild): Secondary | ICD-10-CM | POA: Diagnosis not present

## 2020-01-24 DIAGNOSIS — M6389 Disorders of muscle in diseases classified elsewhere, multiple sites: Secondary | ICD-10-CM | POA: Diagnosis not present

## 2020-01-24 DIAGNOSIS — R296 Repeated falls: Secondary | ICD-10-CM | POA: Diagnosis not present

## 2020-01-24 DIAGNOSIS — R1112 Projectile vomiting: Secondary | ICD-10-CM | POA: Diagnosis not present

## 2020-01-24 DIAGNOSIS — R1110 Vomiting, unspecified: Secondary | ICD-10-CM | POA: Diagnosis not present

## 2020-01-24 DIAGNOSIS — R410 Disorientation, unspecified: Secondary | ICD-10-CM | POA: Diagnosis not present

## 2020-01-24 DIAGNOSIS — J181 Lobar pneumonia, unspecified organism: Secondary | ICD-10-CM | POA: Diagnosis not present

## 2020-01-24 DIAGNOSIS — F39 Unspecified mood [affective] disorder: Secondary | ICD-10-CM | POA: Diagnosis not present

## 2020-01-24 DIAGNOSIS — Z8781 Personal history of (healed) traumatic fracture: Secondary | ICD-10-CM | POA: Diagnosis not present

## 2020-01-24 DIAGNOSIS — R7989 Other specified abnormal findings of blood chemistry: Secondary | ICD-10-CM | POA: Diagnosis not present

## 2020-01-24 DIAGNOSIS — R109 Unspecified abdominal pain: Secondary | ICD-10-CM | POA: Diagnosis not present

## 2020-01-24 DIAGNOSIS — F0391 Unspecified dementia with behavioral disturbance: Secondary | ICD-10-CM | POA: Diagnosis not present

## 2020-01-24 DIAGNOSIS — R7981 Abnormal blood-gas level: Secondary | ICD-10-CM | POA: Diagnosis not present

## 2020-01-24 DIAGNOSIS — N19 Unspecified kidney failure: Secondary | ICD-10-CM | POA: Diagnosis not present

## 2020-01-24 DIAGNOSIS — T17900A Unspecified foreign body in respiratory tract, part unspecified causing asphyxiation, initial encounter: Secondary | ICD-10-CM | POA: Diagnosis not present

## 2020-01-24 DIAGNOSIS — E8809 Other disorders of plasma-protein metabolism, not elsewhere classified: Secondary | ICD-10-CM | POA: Diagnosis not present

## 2020-01-24 DIAGNOSIS — N179 Acute kidney failure, unspecified: Secondary | ICD-10-CM | POA: Diagnosis not present

## 2020-01-24 DIAGNOSIS — Z79899 Other long term (current) drug therapy: Secondary | ICD-10-CM | POA: Diagnosis not present

## 2020-01-24 DIAGNOSIS — R41841 Cognitive communication deficit: Secondary | ICD-10-CM | POA: Diagnosis not present

## 2020-01-24 DIAGNOSIS — U071 COVID-19: Secondary | ICD-10-CM | POA: Diagnosis not present

## 2020-01-24 DIAGNOSIS — R1311 Dysphagia, oral phase: Secondary | ICD-10-CM | POA: Diagnosis not present

## 2020-01-24 DIAGNOSIS — S32010D Wedge compression fracture of first lumbar vertebra, subsequent encounter for fracture with routine healing: Secondary | ICD-10-CM | POA: Diagnosis not present

## 2020-01-24 DIAGNOSIS — I5032 Chronic diastolic (congestive) heart failure: Secondary | ICD-10-CM | POA: Diagnosis not present

## 2020-01-24 DIAGNOSIS — R5381 Other malaise: Secondary | ICD-10-CM | POA: Diagnosis not present

## 2020-01-24 DIAGNOSIS — D72829 Elevated white blood cell count, unspecified: Secondary | ICD-10-CM | POA: Diagnosis not present

## 2020-01-24 DIAGNOSIS — T17908A Unspecified foreign body in respiratory tract, part unspecified causing other injury, initial encounter: Secondary | ICD-10-CM | POA: Diagnosis not present

## 2020-01-24 DIAGNOSIS — R630 Anorexia: Secondary | ICD-10-CM | POA: Diagnosis not present

## 2020-01-24 DIAGNOSIS — E876 Hypokalemia: Secondary | ICD-10-CM | POA: Diagnosis not present

## 2020-01-24 DIAGNOSIS — J189 Pneumonia, unspecified organism: Secondary | ICD-10-CM | POA: Diagnosis not present

## 2020-01-24 DIAGNOSIS — L270 Generalized skin eruption due to drugs and medicaments taken internally: Secondary | ICD-10-CM | POA: Diagnosis not present

## 2020-01-24 DIAGNOSIS — R2681 Unsteadiness on feet: Secondary | ICD-10-CM | POA: Diagnosis not present

## 2020-01-24 DIAGNOSIS — J690 Pneumonitis due to inhalation of food and vomit: Secondary | ICD-10-CM | POA: Diagnosis not present

## 2020-01-24 LAB — CBC
HCT: 37.6 % — ABNORMAL LOW (ref 39.0–52.0)
Hemoglobin: 12.8 g/dL — ABNORMAL LOW (ref 13.0–17.0)
MCH: 32.5 pg (ref 26.0–34.0)
MCHC: 34 g/dL (ref 30.0–36.0)
MCV: 95.4 fL (ref 80.0–100.0)
Platelets: 384 10*3/uL (ref 150–400)
RBC: 3.94 MIL/uL — ABNORMAL LOW (ref 4.22–5.81)
RDW: 12.9 % (ref 11.5–15.5)
WBC: 17.4 10*3/uL — ABNORMAL HIGH (ref 4.0–10.5)
nRBC: 0 % (ref 0.0–0.2)

## 2020-01-24 LAB — CULTURE, BAL-QUANTITATIVE W GRAM STAIN: Culture: 20000 — AB

## 2020-01-24 LAB — GLUCOSE, CAPILLARY
Glucose-Capillary: 86 mg/dL (ref 70–99)
Glucose-Capillary: 104 mg/dL — ABNORMAL HIGH (ref 70–99)

## 2020-01-24 MED ORDER — AMOXICILLIN-POT CLAVULANATE 875-125 MG PO TABS: 1.0000 | ORAL_TABLET | Freq: Two times a day (BID) | ORAL | 0 refills | Status: AC

## 2020-01-24 NOTE — Progress Notes (Signed)
Occupational Therapy Treatment Patient Details Name: Eric Berger MRN: [id number] DOB: Nov 29, 1925 Today's Date: 01/24/2020    History of present illness Pt is a 86 yr old male who was admitted due to falls, decrease in appetitie, vomiting and increase in need for assistance. pmh but not limited to: CVA, CKD, HTN, HLD, GERD, HFpEF (reported, Last TTE was 2017 with no abnormalities), pneumonia 2 years ago   OT comments  Pt progressing to OOB ADL; standing for pericare x1 min with leaning forward posture. Pt very HOH and able to follow commands, but also requires multimodal cues to perform task. Pt set-upA for light grooming sitting upright in recliner and encouraged to perform BUE exercises. Pt requiring rest break and continued eating in sitting after set-upA. Pt appears motivated to return to PLOF. Pt would benefit from continued OT skilled services for ADL, mobility and safety. OT following acutely.   Follow Up Recommendations  SNF;Supervision/Assistance - 24 hour    Equipment Recommendations  Other (comment)(deferred)    Recommendations for Other Services      Precautions / Restrictions Precautions Precautions: Fall;Other (comment) Precaution Comments: monitor vitals Restrictions Weight Bearing Restrictions: No       Mobility Bed Mobility Overal bed mobility: Needs Assistance Bed Mobility: Supine to Sit;Sit to Supine     Supine to sit: Mod assist;+2 for physical assistance;+2 for safety/equipment     General bed mobility comments: modAx2, with hand over hand, to come to EoB with cues for hand placement and guidance for BLEs maneuvering and trunk elevation.  Transfers Overall transfer level: Needs assistance Equipment used: 2 person hand held assist;1 person hand held assist Transfers: Sit to/from Stand Sit to Stand: +2 physical assistance;Mod assist;Min assist         General transfer comment: modA for power up into standing; pericare performed in standing x1  min.    Balance Overall balance assessment: Needs assistance Sitting-balance support: Bilateral upper extremity supported;Feet supported Sitting balance-Leahy Scale: Fair     Standing balance support: Bilateral upper extremity supported;During functional activity Standing balance-Leahy Scale: Poor                             ADL either performed or assessed with clinical judgement   ADL Overall ADL's : Needs assistance/impaired Eating/Feeding: Set up;Sitting Eating/Feeding Details (indicate cue type and reason): feeding self; able to open containers Grooming: Wash/dry hands;Set up;Sitting Grooming Details (indicate cue type and reason): cues to sit upright instead of leaning in recliner                 Toilet Transfer: Moderate assistance;+2 for physical assistance;Stand-pivot Toilet Transfer Details (indicate cue type and reason): no use of RW, hand held assist taking a few steps to recliner; (simulated by recliner) Toileting- Clothing Manipulation and Hygiene: Moderate assistance;+2 for physical assistance;Sit to/from stand Toileting - Clothing Manipulation Details (indicate cue type and reason): Pt stood for posterior pericare in standing x1 mins- stooped over posture for task     Functional mobility during ADLs: Moderate assistance;+2 for physical assistance(constant cueing) General ADL Comments: Pt consistently following 1 step commands with repeated instructions due to [facility]. Pt requiring assist for most ADL, but improvement today with bed mobility.     Vision       Perception     Praxis      Cognition Arousal/Alertness: Awake/alert Behavior During Therapy: WFL for tasks assessed/performed Overall Cognitive Status: No family/caregiver present to determine baseline  cognitive functioning                                 General Comments: following all commands        Exercises     Shoulder Instructions       General Comments VSS  on RA.    Pertinent Vitals/ Pain       Pain Assessment: No/denies pain  Home Living                                          Prior Functioning/Environment              Frequency  Min 2X/week        Progress Toward Goals  OT Goals(current goals can now be found in the care plan section)  Progress towards OT goals: Progressing toward goals  Acute Rehab OT Goals Patient Stated Goal: go to rehab OT Goal Formulation: With patient Time For Goal Achievement: 02-25-20 Potential to Achieve Goals: Good ADL Goals Pt Will Perform Upper Body Dressing: with min guard assist;sitting Pt Will Perform Lower Body Dressing: with mod assist;sit to/from stand Pt Will Transfer to Toilet: with mod assist;ambulating;regular height toilet  Plan Discharge plan remains appropriate    Co-evaluation    PT/OT/SLP Co-Evaluation/Treatment: Yes Reason for Co-Treatment: Complexity of the patient's impairments (multi-system involvement);To address functional/ADL transfers PT goals addressed during session: Mobility/safety with mobility OT goals addressed during session: ADL's and self-care      AM-PAC OT "6 Clicks" Daily Activity     Outcome Measure   Help from another person eating meals?: A Little Help from another person taking care of personal grooming?: A Little Help from another person toileting, which includes using toliet, bedpan, or urinal?: A Lot Help from another person bathing (including washing, rinsing, drying)?: A Lot Help from another person to put on and taking off regular upper body clothing?: A Little Help from another person to put on and taking off regular lower body clothing?: A Lot 6 Click Score: 15    End of Session Equipment Utilized During Treatment: Gait belt;Rolling walker  OT Visit Diagnosis: Unsteadiness on feet (R26.81);Muscle weakness (generalized) (M62.81)   Activity Tolerance Patient limited by fatigue   Patient Left in bed;with call  bell/phone within reach;with bed alarm set   Nurse Communication Mobility status        Time: 0347-4259 OT Time Calculation (min): 30 min  Charges: OT General Charges $OT Visit: 1 Visit OT Treatments $Self Care/Home Management : 8-22 mins  Flora Lipps, OTR/L Acute Rehabilitation Services Pager: [phone] Office: [phone]    Phyllis Whitefield C 01/24/2020, 1:21 PM

## 2020-01-24 NOTE — Progress Notes (Signed)
Pt offered breakfast and refused at this time. He stated she wasn't ready.

## 2020-01-24 NOTE — Progress Notes (Signed)
Physical Therapy Treatment Patient Details Name: Eric Berger MRN: [id number] DOB: 19-Sep-1926 Today's Date: 01/24/2020    History of Present Illness Pt is a 86 yr old male who was admitted due to falls, decrease in appetitie, vomiting and increase in need for assistance. pmh but not limited to: CVA, CKD, HTN, HLD, GERD, HFpEF (reported, Last TTE was 2017 with no abnormalities), pneumonia 2 years ago    PT Comments    Pt initially reluctant to get out of bed, but when told the sheets need to be changed pt agreeable. Pt is limited in safe mobility by decreased strength and balance. Pt requires modAx2 for bed mobility, transfers and ambulation of 2 feet from bed to recliner. D/c plans remain appropriate at this time. PT will continue to follow acutely.    Follow Up Recommendations  SNF     Equipment Recommendations  None recommended by PT       Precautions / Restrictions Precautions Precautions: Fall;Other (comment) Precaution Comments: monitor vitals Restrictions Weight Bearing Restrictions: No    Mobility  Bed Mobility Overal bed mobility: Needs Assistance Bed Mobility: Supine to Sit;Sit to Supine     Supine to sit: Mod assist;+2 for physical assistance;+2 for safety/equipment     General bed mobility comments: modAx2, with hand over hand, to come to EoB pt states "don't be rough" as attempting to move B LE across bed, once off bed pt able to pull up on bed rail to come to EoB and push trunk up to upright   Transfers Overall transfer level: Needs assistance Equipment used: 2 person hand held assist;1 person hand held assist Transfers: Sit to/from Stand Sit to Stand: +2 physical assistance;Mod assist;Min assist         General transfer comment: modA for power up into standing using gait belt, pt able to use bed rail to push up, min Ax1 for power up from recliner for pericare after transfer  Ambulation/Gait Ambulation/Gait assistance: Mod assist;+2 physical  assistance Gait Distance (Feet): 2 Feet Assistive device: 2 person hand held assist Gait Pattern/deviations: Step-to pattern;Decreased step length - right;Decreased step length - left;Shuffle Gait velocity: slowed Gait velocity interpretation: <1.31 ft/sec, indicative of household ambulator General Gait Details: modAx2 for steadying with shuffling steps from bed to recliner, multiomodal cues for hand placement          Balance Overall balance assessment: Needs assistance Sitting-balance support: Bilateral upper extremity supported;Feet supported Sitting balance-Leahy Scale: Fair     Standing balance support: Bilateral upper extremity supported;During functional activity Standing balance-Leahy Scale: Poor                              Cognition Arousal/Alertness: Awake/alert Behavior During Therapy: WFL for tasks assessed/performed Overall Cognitive Status: No family/caregiver present to determine baseline cognitive functioning                                           General Comments General comments (skin integrity, edema, etc.): VSS on RA      Pertinent Vitals/Pain Pain Assessment: No/denies pain           PT Goals (current goals can now be found in the care plan section) Acute Rehab PT Goals Patient Stated Goal: get to bed PT Goal Formulation: With patient Time For Goal Achievement: 02/04/20 Potential to Achieve Goals: Good  Progress towards PT goals: Progressing toward goals    Frequency    Min 2X/week      PT Plan Current plan remains appropriate    Co-evaluation PT/OT/SLP Co-Evaluation/Treatment: Yes Reason for Co-Treatment: For patient/therapist safety;Necessary to address cognition/behavior during functional activity PT goals addressed during session: Mobility/safety with mobility        AM-PAC PT "6 Clicks" Mobility   Outcome Measure  Help needed turning from your back to your side while in a flat bed without  using bedrails?: A Little Help needed moving from lying on your back to sitting on the side of a flat bed without using bedrails?: A Lot Help needed moving to and from a bed to a chair (including a wheelchair)?: A Lot Help needed standing up from a chair using your arms (e.g., wheelchair or bedside chair)?: A Lot Help needed to walk in hospital room?: Total Help needed climbing 3-5 steps with a railing? : Total 6 Click Score: 11    End of Session Equipment Utilized During Treatment: Gait belt Activity Tolerance: Patient tolerated treatment well Patient left: with call bell/phone within reach;in chair;with chair alarm set Nurse Communication: Mobility status PT Visit Diagnosis: Unsteadiness on feet (R26.81);Muscle weakness (generalized) (M62.81);Difficulty in walking, not elsewhere classified (R26.2)     Time: 8616-8372 PT Time Calculation (min) (ACUTE ONLY): 11 min  Charges:  $Therapeutic Activity: 8-22 mins                     Liviana Mills B. Beverely Risen PT, DPT Acute Rehabilitation Services Pager [phone] Office [phone]    Elon Alas Fleet 01/24/2020, 12:16 PM

## 2020-01-24 NOTE — Progress Notes (Deleted)
Patient transported to [facility] via [place]. Discharge paper work provided. No needs voiced at time of discharge.

## 2020-01-24 NOTE — Progress Notes (Signed)
Patient transported to [facility] via [place]. No needs voiced at time of discharge.

## 2020-01-24 NOTE — Discharge Summary (Signed)
Physician Discharge Summary  Eric Berger:[id number] DOB: 07-Aug-1926 DOA: 01/20/2020  PCP: Susy Frizzle, MD  Admit date: 01/20/2020 Discharge date: 01/24/2020  Admitted From: home Disposition:  SNF  Recommendations for Outpatient Follow-up:  1. Follow up with PCP in 1-2 weeks 2. Please obtain BMP/CBC in one week  Home Health: none Equipment/Devices: none  Discharge Condition: stable CODE STATUS: Full code Diet recommendation: regular  HPI: Per admitting MD, Eric Berger is a 86 y.o. male with medical history significant of stroke (previous), CKD, HTN, HLD, GERD, HFpEF (reported, Last TTE was 2017 with no abnormalities) who presents for fall and weakness.  Son, Ulice Dash, is in room and helps with reporting.  Per Ulice Dash, about 2 weeks ago patient started doing poorly with nausea, vomiting decreased appetite.  He fell on Thursday of last week, Monday, Wednesday and Thursday of this week and needed assistance to get up.  His wife and daughter who live in the home were unable to get him up.  He has been throwing up everything except eggs and in the last 4 days he has eaten about 4 eggs.  He is not sure if his urine is decreased.  He has no pain.  He has a chronic cough for about 2 years, but it does seem to be worse.  He had pneumonia 2 years ago and since then has had a cough.  He needed thickener in his fluids at that time and had difficulty swallowing.  However, he has not used that for a while.  He is no longer to be cared for at home as his wife is elderly and his daughter is in poor health.  He presented to the ED on 4/23 and was noted to have a compression fracture in the L spine.  He only has pain when lying on it certain ways.  He reports no dizziness, chest pain, fever, chills, hematemesis, blood in the stool.  He has never had a colonoscopy and "never will."  ED Course: In the ED, he was found to have acute on chronic renal failure with a BUN/Cr ratio of 38.  His AG is 20,  with small ketones in the urine.  His GFR is around 40, down from the 50s.  He has a WBC of 14.3 with a neutrophil predominance.  UA does not show an infection.  SARS test is negative. CXR showed a possible LLL pneumonia.  CT abdomen/pelvis showed consolidation of the LLL, findings more likely due to collapse than pneumonia per the read, L1 compression fracture, renal cysts.    Hospital Course / Discharge diagnoses: Principal Problem Left lower lobe pneumonia, POA-possibly aspiration pneumonia, no evidence of proximal bronchial occlusion.  Patient was initially started on IV antibiotics, and now transition to Augmentin for total of 7 days   Active Problems Aspirated foreign body within the right mainstem bronchus extending into the right lower lobe bronchus-status post bronchoscopy on 5/2, chicken bone as well as a pea was extracted.  Patient has been stable on room air, he did have elevated WBC post bronchoscopy but that is now improving.  He was narrowed to Augmentin for a total of 7 days.  Speech therapy also saw the patient and cleared him for regular diet Hypokalemia -potassium normalized Acute kidney injury on chronic kidney disease stage II-baseline creatinine about 1.0-1.2, elevated to 1.5 on admission.  Creatinine normalized and remained stable Essential hypertension-his blood pressure is stable, resume home medications Hyperlipidemia-continue home statin Chronic diastolic CHF-appears stable and  euvolemic this morning Prior CVA, weakness-to go to SNF Recent follow-up L-spine fracture- no pain, supportive management right now  Discharge Instructions   Allergies as of 01/24/2020   No Known Allergies     Medication List    TAKE these medications   amoxicillin-clavulanate 875-125 MG tablet Commonly known as: AUGMENTIN Take 1 tablet by mouth every 12 (twelve) hours.   aspirin EC 325 MG tablet Take 325 mg by mouth daily. What changed: Another medication with the same name was  removed. Continue taking this medication, and follow the directions you see here.   hydrochlorothiazide 25 MG tablet Commonly known as: HYDRODIURIL TAKE 1 TABLET BY MOUTH EVERY DAY   losartan 100 MG tablet Commonly known as: COZAAR TAKE 1/2 TABLET BY MOUTH DAILY   multivitamin with minerals Tabs tablet Take 1 tablet by mouth daily. Centrum Silver   PREVAGEN EXTRA STRENGTH PO Take 1 tablet by mouth daily.   Saw Palmetto 450 MG Caps Take 450 mg by mouth daily.   simvastatin 40 MG tablet Commonly known as: ZOCOR 1 TABLET BY MOUTH ONCE DAILY AT 6PM. REQUIRES OFFICE VISIT BEFORE ANY FURTHER REFILLS CAN BE GIVEN What changed:   how much to take  how to take this  when to take this  additional instructions      Contact information for after-discharge care    Destination    [facility] .   Service: Skilled Nursing Contact information: [address] [place] [phone]              Consultations:  Pulmonary   Procedures/Studies:  Bronchoscopy   DG Chest 2 View  Result Date: 01/13/2020 CLINICAL DATA:  Pain after fall EXAM: CHEST - 2 VIEW COMPARISON:  April 07, 2016 FINDINGS: The heart size and mediastinal contours are within normal limits. Both lungs are clear. The visualized skeletal structures are unremarkable. IMPRESSION: No active cardiopulmonary disease. Electronically Signed   By: Dorise Bullion III M.D   On: 01/13/2020 21:01   DG Lumbar Spine Complete  Result Date: 01/13/2020 CLINICAL DATA:  Pain after fall EXAM: LUMBAR SPINE - COMPLETE 4+ VIEW COMPARISON:  None. FINDINGS: No malalignment. Scalping of the superior endplate of L1, apparently new since 2017. No other fracture or traumatic malalignment. Calcified atherosclerosis in the abdominal aorta. IMPRESSION: There is scalloping of the superior endplate of L1 which is new since 2017. This is consistent with an age indeterminate compression fracture.  If there is concern for an acute compression fracture at this level, MRI could better assess acuity. Electronically Signed   By: Dorise Bullion III M.D   On: 01/13/2020 21:00   CT CHEST WO CONTRAST  Result Date: 01/21/2020 CLINICAL DATA:  Left lower lobe collapse EXAM: CT CHEST WITHOUT CONTRAST TECHNIQUE: Multidetector CT imaging of the chest was performed following the standard protocol without IV contrast. COMPARISON:  Chest radiograph 01/13/2020 FINDINGS: Cardiovascular: Calcific atherosclerosis of the aorta and coronary arteries. Heart size is normal. No pericardial effusion. Mediastinum/Nodes: Multiple subcentimeter mediastinal lymph nodes. Normal thyroid gland. No axillary adenopathy. Normal course of the esophagus. No hiatal hernia. Lungs/Pleura: There is a linear calcific density within the right main stem bronchus extending into the right lower lobe bronchus. The distal aspect appears adherent to the bronchial wall. There is left lower lobe consolidation without proximal bronchial occlusion. Upper Abdomen: No acute abnormality. Musculoskeletal: No chest wall mass or suspicious bone lesions identified. IMPRESSION: 1. Left lower lobe consolidation without proximal bronchial occlusion,  likely aspiration/pneumonia. 2. Linear calcific density within the right main stem bronchus extending into the right lower lobe bronchus. This is probably an aspirated foreign body, likely an animal bone. Chronicity is indeterminate. Bronchoscopy and retrieval should be considered. 3. Aortic Atherosclerosis (ICD10-I70.0). Electronically Signed   By: Ulyses Jarred M.D.   On: 01/21/2020 02:20   CT ABDOMEN PELVIS W CONTRAST  Result Date: 01/20/2020 CLINICAL DATA:  Nausea, vomiting and back pain after recent fall. EXAM: CT ABDOMEN AND PELVIS WITH CONTRAST TECHNIQUE: Multidetector CT imaging of the abdomen and pelvis was performed using the standard protocol following bolus administration of intravenous contrast. CONTRAST:   131m OMNIPAQUE IOHEXOL 300 MG/ML  SOLN COMPARISON:  09/24/2015 FINDINGS: Lower chest: Calcified granuloma over the lateral right middle lobe. Mild atelectasis over the medial right base improved compared to the previous exam. Moderate consolidation over the left lower lobe with suggestion of opacification over the associated left lower lobe bronchus as this likely represents postobstructive atelectasis, although infection is possible. No effusion. Calcified plaque is present over the left anterior descending and right coronary arteries. There is calcified plaque over the descending thoracic aorta. Hepatobiliary: There are a few tiny subcentimeter hypodensities too small to characterize but likely cysts or hemangiomas. Previous cholecystectomy. Subtle prominence of the central intrahepatic ducts. Pancreas: Normal. Spleen: Normal. Adrenals/Urinary Tract: Adrenal glands are normal. Kidneys are normal in size. Evidence of parapelvic left renal cysts 1 of which near the renal pelvis has associated calcification as these are unchanged. Subcentimeter left lower pole renal cortical hypodensity too small to characterize but likely a cyst. Ureters and bladder are unremarkable. Stomach/Bowel: Stomach and small bowel are normal. Appendix is normal. Minimal diverticulosis of the colon with decompression of the ascending and transverse colon. No adjacent inflammatory change or free fluid. Mild fecal retention over the rectum. Vascular/Lymphatic: Moderate calcified plaque over the abdominal aorta which is normal in caliber. No adenopathy. Reproductive: Normal. Other: No free fluid or focal inflammatory change. Musculoskeletal: Degenerative changes of the spine. There is a mild L1 compression fracture new since the previous exam, although unchanged from 01/13/2020 plain films with age indeterminate. Right hip arthroplasty intact. IMPRESSION: 1.  No acute findings in the abdomen/pelvis. 2. Consolidation over the left lower lobe  with obstruction of the associated left lower lobe bronchus as findings likely due to atelectasis/collapse and less likely infection. Minimal medial right basilar atelectasis. 3. Mild L1 compression fracture unchanged from the recent plain films 01/13/2020 but new since 2017 as age indeterminate. 4. Parapelvic left renal cysts unchanged and subcentimeter left renal cortical hypodensity too small to characterize but likely a cyst. 5. Few small subcentimeter liver hypodensities too small to characterize but likely cysts or hemangiomas. 6. Aortic Atherosclerosis (ICD10-I70.0). Atherosclerotic coronary artery disease. Electronically Signed   By: DMarin OlpM.D.   On: 01/20/2020 16:24   [place]RENAL  Result Date: 01/20/2020 CLINICAL DATA:  Renal failure.  Acuity not specified. EXAM: RENAL / URINARY TRACT ULTRASOUND COMPLETE COMPARISON:  Contrast-enhanced abdominopelvic CT earlier today. FINDINGS: Right Kidney: Renal measurements: 9.7 x 4.3 x 4.6 cm = volume: 99.1 mL. Echogenicity within normal limits. No mass or hydronephrosis visualized. Left Kidney: Renal measurements: 10.8 x 4.8 x 4.5 cm = volume: 120 mL. No hydronephrosis. Parapelvic cysts were better appreciated on CT earlier today. No evidence of solid lesion. Bladder: Appears normal for degree of bladder distention. Both ureteral jets are seen. Other: None. IMPRESSION: 1. No hydronephrosis or obstructive uropathy. 2. Left parapelvic cysts on CT earlier  today are not well demonstrated. Electronically Signed   By: Keith Rake M.D.   On: 01/20/2020 21:32   CT L-SPINE NO CHARGE  Result Date: 01/20/2020 CLINICAL DATA:  Back pain after recent fall. EXAM: CT LUMBAR SPINE WITHOUT CONTRAST TECHNIQUE: Multidetector CT imaging of the lumbar spine was performed without intravenous contrast administration. Multiplanar CT image reconstructions were also generated. COMPARISON:  Lumbar spine plain films 01/13/2020 and previous CT 09/24/2015 FINDINGS: Segmentation:  Normal. Alignment: Normal. Vertebrae: Mild spondylosis throughout the lumbar spine to include facet arthropathy over the lower lumbar spine. There is a mild compression fracture of L1 with air immediately under the superior endplate as this is unchanged from the recent plain films, although new since 2017. Presence of air suggest degree of osteonecrosis likely more of a subacute to chronic injury. There is no significant retropulsion of the posterior border of this compression fracture. No fragments within the spinal canal. Mild bilateral neural foraminal narrowing at the L3-4 L4-5 levels. No significant minimal degree of canal stenosis at the L4-5 level greater than the L3-4 level. Paraspinal and other soft tissues: Negative. Disc levels: Disc space narrowing at the L3-4 level unchanged. Minimal broad-based disc bulge at the L3-4 L4-5 levels without definite disc herniation. IMPRESSION: 1. Mild L1 compression fracture, age indeterminate. This fracture is new since 2017 and unchanged from 01/13/2020. Air under the superior endplate of L1 suggest a degree of osteonecrosis as the fracture is likely subacute to chronic in nature. 2. Mild spondylosis of the lumbar spine with disc disease at the L3-4 and L4-5 levels. Minimal canal stenosis at the L4-5 level greater than the L3-4 level. Electronically Signed   By: Marin Olp M.D.   On: 01/20/2020 16:35   DG Chest Portable 1 View  Result Date: 01/20/2020 CLINICAL DATA:  Cough and recent falls. EXAM: PORTABLE CHEST 1 VIEW COMPARISON:  01/13/2020 and prior radiographs FINDINGS: The cardiomediastinal silhouette is unremarkable. Streaky LEFT LOWER lung opacity noted and may represent pneumonia. There is no evidence of pulmonary edema, suspicious pulmonary nodule/mass, pleural effusion, or pneumothorax. No acute bony abnormalities are identified. IMPRESSION: Streaky LEFT LOWER lung opacity which may represent pneumonia. Radiographic follow-up to resolution recommended.  Electronically Signed   By: Margarette Canada M.D.   On: 01/20/2020 14:24   DG Hips Bilat W or Wo Pelvis 3-4 Views  Result Date: 01/13/2020 CLINICAL DATA:  Fall.  History of hip fracture. EXAM: DG HIP (WITH OR WITHOUT PELVIS) 3-4V BILAT COMPARISON:  None. FINDINGS: There is no evidence of hip fracture or dislocation. There is no evidence of arthropathy or other focal bone abnormality. IMPRESSION: Negative. Electronically Signed   By: Dorise Bullion III M.D   On: 01/13/2020 20:57      Subjective: - no chest pain, shortness of breath, no abdominal pain, nausea or vomiting.   Discharge Exam: BP (!) 142/89 (BP Location: Left Arm)   Pulse 71   Temp 97.7 F (36.5 C) (Oral)   Resp 16   Ht _0  (1.651 m)   Wt 75.8 kg   SpO2 94%   BMI 27.79 kg/m   General: Pt is alert, awake, not in acute distress Cardiovascular: RRR, S1/S2 +, no rubs, no gallops Respiratory: CTA bilaterally, no wheezing, no rhonchi Abdominal: Soft, NT, ND, bowel sounds + Extremities: no edema, no cyanosis    The results of significant diagnostics from this hospitalization (including imaging, microbiology, ancillary and laboratory) are listed below for reference.     Microbiology: Recent Results (from  the past 240 hour(s))  Respiratory Panel by RT PCR (Flu A&B, Covid) - Nasopharyngeal Swab     Status: None   Collection Time: 01/20/20  4:16 PM   Specimen: Nasopharyngeal Swab  Result Value Ref Range Status   SARS Coronavirus 2 by RT PCR NEGATIVE NEGATIVE Final    Comment: (NOTE) SARS-CoV-2 target nucleic acids are NOT DETECTED. The SARS-CoV-2 RNA is generally detectable in upper respiratoy specimens during the acute phase of infection. The lowest concentration of SARS-CoV-2 viral copies this assay can detect is 131 copies/mL. A negative result does not preclude SARS-Cov-2 infection and should not be used as the sole basis for treatment or other patient management decisions. A negative result may occur with    improper specimen collection/handling, submission of specimen other than nasopharyngeal swab, presence of viral mutation(s) within the areas targeted by this assay, and inadequate number of viral copies (<131 copies/mL). A negative result must be combined with clinical observations, patient history, and epidemiological information. The expected result is Negative. Fact Sheet for Patients:  PinkCheek.be Fact Sheet for Healthcare Providers:  GravelBags.it This test is not yet ap proved or cleared by the [place] FDA and  has been authorized for detection and/or diagnosis of SARS-CoV-2 by FDA under an Emergency Use Authorization (EUA). This EUA will remain  in effect (meaning this test can be used) for the duration of the COVID-19 declaration under Section 564(b)(1) of the Act, 21 U.S.C. section 360bbb-3(b)(1), unless the authorization is terminated or revoked sooner.    Influenza A by PCR NEGATIVE NEGATIVE Final   Influenza B by PCR NEGATIVE NEGATIVE Final    Comment: (NOTE) The Xpert Xpress SARS-CoV-2/FLU/RSV assay is intended as an aid in  the diagnosis of influenza from Nasopharyngeal swab specimens and  should not be used as a sole basis for treatment. Nasal washings and  aspirates are unacceptable for Xpert Xpress SARS-CoV-2/FLU/RSV  testing. Fact Sheet for Patients: PinkCheek.be Fact Sheet for Healthcare Providers: GravelBags.it This test is not yet approved or cleared by the [place] FDA and  has been authorized for detection and/or diagnosis of SARS-CoV-2 by  FDA under an Emergency Use Authorization (EUA). This EUA will remain  in effect (meaning this test can be used) for the duration of the  Covid-19 declaration under Section 564(b)(1) of the Act, 21  U.S.C. section 360bbb-3(b)(1), unless the authorization is  terminated or revoked. Performed at  [facility], [place] [address], [place], [place] [address]   Blood culture (routine x 2)     Status: None (Preliminary result)   Collection Time: 01/20/20  6:47 PM   Specimen: BLOOD  Result Value Ref Range Status   Specimen Description BLOOD SITE NOT SPECIFIED  Final   Special Requests   Final    BOTTLES DRAWN AEROBIC AND ANAEROBIC Blood Culture adequate volume   Culture   Final    NO GROWTH 4 DAYS Performed at [facility], [address], [place], [place] [address]    Report Status PENDING  Incomplete  Blood culture (routine x 2)     Status: None (Preliminary result)   Collection Time: 01/21/20  4:05 AM   Specimen: BLOOD  Result Value Ref Range Status   Specimen Description BLOOD RIGHT ANTECUBITAL  Final   Special Requests   Final    BOTTLES DRAWN AEROBIC AND ANAEROBIC Blood Culture adequate volume   Culture   Final    NO GROWTH 3 DAYS Performed at [facility], [address] [place], [place] [address]    Report Status PENDING  Incomplete  Culture, bal-quantitative     Status: Abnormal   Collection Time: 01/22/20 10:09 AM   Specimen: Bronchoalveolar Lavage  Result Value Ref Range Status   Specimen Description BRONCHIAL ALVEOLAR LAVAGE  Final   Special Requests LEFT LUNG  Final   Gram Stain   Final    MODERATE WBC PRESENT, PREDOMINANTLY MONONUCLEAR NO ORGANISMS SEEN Performed at [facility], [place] [address], [place], [place] [address]    Culture 20,000 COLONIES/mL ESCHERICHIA COLI (A)  Final   Report Status 01/24/2020 FINAL  Final   Organism ID, Bacteria ESCHERICHIA COLI (A)  Final      Susceptibility   Escherichia coli - MIC*    AMPICILLIN 4 SENSITIVE Sensitive     CEFAZOLIN <=4 SENSITIVE Sensitive     CEFEPIME <=1 SENSITIVE Sensitive     CEFTAZIDIME <=1 SENSITIVE Sensitive     CEFTRIAXONE <=1 SENSITIVE Sensitive     CIPROFLOXACIN <=0.25 SENSITIVE Sensitive     GENTAMICIN <=1 SENSITIVE Sensitive     IMIPENEM <=0.25 SENSITIVE Sensitive      TRIMETH/SULFA <=20 SENSITIVE Sensitive     AMPICILLIN/SULBACTAM <=2 SENSITIVE Sensitive     PIP/TAZO <=4 SENSITIVE Sensitive     * 20,000 COLONIES/mL ESCHERICHIA COLI  SARS CORONAVIRUS 2 (TAT 6-24 HRS) Nasopharyngeal Nasopharyngeal Swab     Status: None   Collection Time: 01/23/20 12:04 PM   Specimen: Nasopharyngeal Swab  Result Value Ref Range Status   SARS Coronavirus 2 NEGATIVE NEGATIVE Final    Comment: (NOTE) SARS-CoV-2 target nucleic acids are NOT DETECTED. The SARS-CoV-2 RNA is generally detectable in upper and lower respiratory specimens during the acute phase of infection. Negative results do not preclude SARS-CoV-2 infection, do not rule out co-infections with other pathogens, and should not be used as the sole basis for treatment or other patient management decisions. Negative results must be combined with clinical observations, patient history, and epidemiological information. The expected result is Negative. Fact Sheet for Patients: SugarRoll.be Fact Sheet for Healthcare Providers: https://www.woods-mathews.com/ This test is not yet approved or cleared by the [place] FDA and  has been authorized for detection and/or diagnosis of SARS-CoV-2 by FDA under an Emergency Use Authorization (EUA). This EUA will remain  in effect (meaning this test can be used) for the duration of the COVID-19 declaration under Section 56 4(b)(1) of the Act, 21 U.S.C. section 360bbb-3(b)(1), unless the authorization is terminated or revoked sooner. Performed at [facility], [place] [address] [place], [place] [address]      Labs: Basic Metabolic Panel: Recent Labs  Lab 01/20/20 1412 01/21/20 0405 01/22/20 0635 01/23/20 0622  NA 137 140 141 143  K 4.3 2.9* 3.1* 4.8  CL 95* 107 109 111  CO2 _0 GLUCOSE 113* 89 96 127*  BUN 57* 49* 39* 32*  CREATININE 1.47* 1.27* 1.18 1.18  CALCIUM 9.2 7.9* 8.2* 8.5*  MG  --   --  2.0  --      Liver Function Tests: Recent Labs  Lab 01/20/20 1412 01/21/20 0405 01/22/20 0635  AST 45* 27 32  ALT _1 ALKPHOS 65 45 55  BILITOT 2.7* 0.8 0.6  PROT 7.1 5.4* 5.4*  ALBUMIN 3.2* 2.2* 2.2*   CBC: Recent Labs  Lab 01/20/20 1412 01/21/20 0405 01/22/20 0635 01/23/20 0622 01/24/20 0516  WBC 14.3* 8.5 9.5 19.0* 17.4*  NEUTROABS 11.5*  --   --   --   --  HGB 13.9 11.0* 10.8* 10.8* 12.8*  HCT 41.2 31.8* 32.1* 32.2* 37.6*  MCV 94.3 94.1 95.3 96.7 95.4  PLT 280 292 326 328 384   CBG: Recent Labs  Lab 01/21/20 0828 01/22/20 0626 01/23/20 0614 01/23/20 2125 01/24/20 1154  GLUCAP 78 98 131* 169* 86   Hgb A1c No results for input(s): HGBA1C in the last 72 hours. Lipid Profile No results for input(s): CHOL, HDL, LDLCALC, TRIG, CHOLHDL, LDLDIRECT in the last 72 hours. Thyroid function studies No results for input(s): TSH, T4TOTAL, T3FREE, THYROIDAB in the last 72 hours.  Invalid input(s): FREET3 Urinalysis    Component Value Date/Time   COLORURINE YELLOW 01/20/2020 1430   APPEARANCEUR CLEAR 01/20/2020 1430   LABSPEC 1.017 01/20/2020 1430   PHURINE 5.0 01/20/2020 1430   GLUCOSEU NEGATIVE 01/20/2020 1430   HGBUR NEGATIVE 01/20/2020 1430   BILIRUBINUR NEGATIVE 01/20/2020 1430   KETONESUR 20 (A) 01/20/2020 1430   PROTEINUR NEGATIVE 01/20/2020 1430   UROBILINOGEN 0.2 06/08/2012 0901   NITRITE NEGATIVE 01/20/2020 1430   LEUKOCYTESUR NEGATIVE 01/20/2020 1430    FURTHER DISCHARGE INSTRUCTIONS:   Get Medicines reviewed and adjusted: Please take all your medications with you for your next visit with your Primary MD   Laboratory/radiological data: Please request your Primary MD to go over all hospital tests and procedure/radiological results at the follow up, please ask your Primary MD to get all Hospital records sent to his/her office.   In some cases, they will be blood work, cultures and biopsy results pending at the time of your discharge. Please request  that your primary care M.D. goes through all the records of your hospital data and follows up on these results.   Also Note the following: If you experience worsening of your admission symptoms, develop shortness of breath, life threatening emergency, suicidal or homicidal thoughts you must seek medical attention immediately by calling 911 or calling your MD immediately  if symptoms less severe.   You must read complete instructions/literature along with all the possible adverse reactions/side effects for all the Medicines you take and that have been prescribed to you. Take any new Medicines after you have completely understood and accpet all the possible adverse reactions/side effects.    Do not drive when taking Pain medications or sleeping medications (Benzodaizepines)   Do not take more than prescribed Pain, Sleep and Anxiety Medications. It is not advisable to combine anxiety,sleep and pain medications without talking with your primary care practitioner   Special Instructions: If you have smoked or chewed Tobacco  in the last 2 yrs please stop smoking, stop any regular Alcohol  and or any Recreational drug use.   Wear Seat belts while driving.   Please note: You were cared for by a hospitalist during your hospital stay. Once you are discharged, your primary care physician will handle any further medical issues. Please note that NO REFILLS for any discharge medications will be authorized once you are discharged, as it is imperative that you return to your primary care physician (or establish a relationship with a primary care physician if you do not have one) for your post hospital discharge needs so that they can reassess your need for medications and monitor your lab values.  Time coordinating discharge: 40 minutes  SIGNED:  Marzetta Board, MD, PhD 01/24/2020, 3:38 PM

## 2020-01-24 NOTE — TOC Transition Note (Signed)
Transition of Care [facility]) - CM/SW Discharge Note   Patient Details  Name: Eric Berger MRN: [id number] Date of Birth: 1926/07/09  [facility] CM/SW Contact:  Gildardo Griffes, LCSW Phone Number: 01/24/2020, 3:39 PM   Clinical Narrative:     Patient will DC to: [facility] Anticipated DC date: 01/24/20 Family notified: Vonna Kotyk Transport by: Sharin Mons  Per MD patient ready for DC to [facility]. RN, patient, patient's family, and facility notified of DC. Discharge Summary sent to facility. RN given number for report [phone]  . DC packet on chart. Ambulance transport requested for patient.  CSW signing off.  [place], [place] [phone]   Final next level of care: Skilled Nursing Facility Barriers to Discharge: No Barriers Identified   Patient Goals and CMS Choice   CMS Medicare.gov Compare Post Acute Care list provided to:: Patient Represenative (must comment)(son Vonna Kotyk) Choice offered to / list presented to : Adult Children(son Vonna Kotyk)  Discharge Placement PASRR number recieved: 01/23/20            Patient chooses bed at: [facility] Patient to be transferred to facility by: [facility] Name of family member notified: Vonna Kotyk (Son) Patient and family notified of of transfer: 01/24/20  Discharge Plan and Services     Post Acute Care Choice: Skilled Nursing Facility                               Social Determinants of Health (SDOH) Interventions     Readmission Risk Interventions No flowsheet data found.

## 2020-01-24 NOTE — TOC Progression Note (Signed)
Transition of Care [facility]) - Progression Note    Patient Details  Name: AGUSTINE ROSSITTO MRN: [id number] Date of Birth: 07/01/1926  Transition of Care [facility]) CM/SW Contact  Gildardo Griffes, [place] Phone Number: 01/24/2020, 10:02 AM  Clinical Narrative:     CSW confirmed with French Ana at [facility] is pending, needing updated PT/OT notes. CSW has reached out to PT and OT, they will see patient before lunch for updated notes to be sent to [facility] for insurance auth.   Expected Discharge Plan: Skilled Nursing Facility Barriers to Discharge: Continued Medical Work up  Expected Discharge Plan and Services Expected Discharge Plan: Skilled Nursing Facility     Post Acute Care Choice: Skilled Nursing Facility Living arrangements for the past 2 months: Single Family Home                                       Social Determinants of Health (SDOH) Interventions    Readmission Risk Interventions No flowsheet data found.

## 2020-01-24 NOTE — Progress Notes (Deleted)
PROGRESS NOTE  Eric Berger WNI:[id number] DOB: July 03, 1926 DOA: 01/20/2020 PCP: Susy Frizzle, MD   LOS: 4 days   Brief Narrative / Interim history: 86 year old male with history of CVA, chronic kidney disease, HTN, HLD, heart failure with preserved EF with last TTE in 2017, came to the hospital with failure to thrive, falls, weakness.  For the past couple of weeks patient has been having poor appetite, nausea, vomiting and recurrent falls at home.  He has a history of having pneumonia about couple years ago and has had a chronic cough since.  He also had difficulty swallowing.  CT scan of the chest showed foreign object in his mainstem bronchus on the right side, pulmonology consulted and he is status post bronchoscopy with bone retrieval.  PT recommended SNF and he is now awaiting for insurance authorization  Subjective / 24h Interval events: No complaints this morning, feeling well, denies any shortness of breath  Assessment & Plan: Principal Problem Left lower lobe pneumonia, POA-possibly aspiration pneumonia, no evidence of proximal bronchial occlusion.  Patient was initially started on IV antibiotics, and now transition to Augmentin for total of 7 days   Active Problems Aspirated foreign body within the right mainstem bronchus extending into the right lower lobe bronchus-status post bronchoscopy on 5/2, chicken bone as well as a pea was extracted.  Patient has been stable on room air, he did have elevated WBC post bronchoscopy but that is now improving.  He was narrowed to Augmentin for a total of 7 days.  Speech therapy also saw the patient and cleared him for regular diet  Hypokalemia -potassium normalized  Acute kidney injury on chronic kidney disease stage II-baseline creatinine about 1.0-1.2, elevated to 1.5 on admission.  Creatinine normalized and remained stable  Essential hypertension-his blood pressure is stable, his home hydrochlorothiazide and losartan are still on  hold  Hyperlipidemia-continue home statin  Chronic diastolic CHF-appears stable and euvolemic this morning  Prior CVA, weakness-insurance authorization pending for SNF placement  Recent follow-up L-spine fracture- no pain, supportive management right now  Scheduled Meds: . amoxicillin-clavulanate  1 tablet Oral Q12H  . aspirin EC  325 mg Oral Daily  . enoxaparin (LOVENOX) injection  40 mg Subcutaneous Q24H  . multivitamin with minerals  1 tablet Oral Daily  . simvastatin  40 mg Oral Daily   Continuous Infusions:  PRN Meds:.acetaminophen **OR** acetaminophen, metoprolol tartrate, phenol, promethazine  DVT prophylaxis: Lovenox Code Status: Full code Family Communication: will update son in the afternoon  Status is: Inpatient  Remains inpatient appropriate because: Waiting on insurance authorization  Dispo: The patient is from: Home              Anticipated d/c is to: SNF              Anticipated d/c date is: 1 day              Patient currently is not medically stable to d/c.  Consultants:  Pumonary   Procedures:  None   Microbiology  None   Antimicrobials: Ceftriaxone / Azithromycin 4/30 >> 5/2 Augmentin 5/2 >>   Objective: Vitals:   01/24/20 0751 01/24/20 0814 01/24/20 1138 01/24/20 1141  BP: 134/73 108/75 (!) 142/89   Pulse: (!) 106 75 71   Resp: _0 Temp: 99.2 F (37.3 C)  97.7 F (36.5 C)   TempSrc: Oral  Oral   SpO2: 96% 94% (!) 81% 94%  Weight:      Height:  Intake/Output Summary (Last 24 hours) at 01/24/2020 1418 Last data filed at 01/24/2020 1300 Gross per 24 hour  Intake 720 ml  Output 275 ml  Net 445 ml   Filed Weights   01/22/20 0500 01/22/20 0918 01/23/20 0431  Weight: 77.1 kg 77.1 kg 75.8 kg    Examination:  Constitutional: No distress Eyes: No icterus ENMT: mmm Neck: normal, supple Respiratory: Clear to auscultation bilaterally, no wheezing, no crackles Cardiovascular: Regular rate and rhythm, no murmurs, trace  edema Abdomen: Soft, nontender, nondistended, positive bowel sounds Musculoskeletal: no clubbing / cyanosis.  Skin: No rashes seen Neurologic: Nonfocal  Data Reviewed: I have independently reviewed following labs and imaging studies   CBC: Recent Labs  Lab 01/20/20 1412 01/21/20 0405 01/22/20 0635 01/23/20 0622 01/24/20 0516  WBC 14.3* 8.5 9.5 19.0* 17.4*  NEUTROABS 11.5*  --   --   --   --   HGB 13.9 11.0* 10.8* 10.8* 12.8*  HCT 41.2 31.8* 32.1* 32.2* 37.6*  MCV 94.3 94.1 95.3 96.7 95.4  PLT 280 292 326 328 053   Basic Metabolic Panel: Recent Labs  Lab 01/20/20 1412 01/21/20 0405 01/22/20 0635 01/23/20 0622  NA 137 140 141 143  K 4.3 2.9* 3.1* 4.8  CL 95* 107 109 111  CO2 _0 GLUCOSE 113* 89 96 127*  BUN 57* 49* 39* 32*  CREATININE 1.47* 1.27* 1.18 1.18  CALCIUM 9.2 7.9* 8.2* 8.5*  MG  --   --  2.0  --    Liver Function Tests: Recent Labs  Lab 01/20/20 1412 01/21/20 0405 01/22/20 0635  AST 45* 27 32  ALT _1 ALKPHOS 65 45 55  BILITOT 2.7* 0.8 0.6  PROT 7.1 5.4* 5.4*  ALBUMIN 3.2* 2.2* 2.2*   Coagulation Profile: No results for input(s): INR, PROTIME in the last 168 hours. HbA1C: No results for input(s): HGBA1C in the last 72 hours. CBG: Recent Labs  Lab 01/21/20 0828 01/22/20 0626 01/23/20 0614 01/23/20 2125 01/24/20 1154  GLUCAP 78 98 131* 169* 86    Recent Results (from the past 240 hour(s))  Respiratory Panel by RT PCR (Flu A&B, Covid) - Nasopharyngeal Swab     Status: None   Collection Time: 01/20/20  4:16 PM   Specimen: Nasopharyngeal Swab  Result Value Ref Range Status   SARS Coronavirus 2 by RT PCR NEGATIVE NEGATIVE Final    Comment: (NOTE) SARS-CoV-2 target nucleic acids are NOT DETECTED. The SARS-CoV-2 RNA is generally detectable in upper respiratoy specimens during the acute phase of infection. The lowest concentration of SARS-CoV-2 viral copies this assay can detect is 131 copies/mL. A negative result does not  preclude SARS-Cov-2 infection and should not be used as the sole basis for treatment or other patient management decisions. A negative result may occur with  improper specimen collection/handling, submission of specimen other than nasopharyngeal swab, presence of viral mutation(s) within the areas targeted by this assay, and inadequate number of viral copies (<131 copies/mL). A negative result must be combined with clinical observations, patient history, and epidemiological information. The expected result is Negative. Fact Sheet for Patients:  PinkCheek.be Fact Sheet for Healthcare Providers:  GravelBags.it This test is not yet ap proved or cleared by the [place] FDA and  has been authorized for detection and/or diagnosis of SARS-CoV-2 by FDA under an Emergency Use Authorization (EUA). This EUA will remain  in effect (meaning this test can be used) for the duration of the COVID-19 declaration under  Section 564(b)(1) of the Act, 21 U.S.C. section 360bbb-3(b)(1), unless the authorization is terminated or revoked sooner.    Influenza A by PCR NEGATIVE NEGATIVE Final   Influenza B by PCR NEGATIVE NEGATIVE Final    Comment: (NOTE) The Xpert Xpress SARS-CoV-2/FLU/RSV assay is intended as an aid in  the diagnosis of influenza from Nasopharyngeal swab specimens and  should not be used as a sole basis for treatment. Nasal washings and  aspirates are unacceptable for Xpert Xpress SARS-CoV-2/FLU/RSV  testing. Fact Sheet for Patients: PinkCheek.be Fact Sheet for Healthcare Providers: GravelBags.it This test is not yet approved or cleared by the [place] FDA and  has been authorized for detection and/or diagnosis of SARS-CoV-2 by  FDA under an Emergency Use Authorization (EUA). This EUA will remain  in effect (meaning this test can be used) for the duration of the    Covid-19 declaration under Section 564(b)(1) of the Act, 21  U.S.C. section 360bbb-3(b)(1), unless the authorization is  terminated or revoked. Performed at [facility], [place] [address], [place], [place] [address]   Blood culture (routine x 2)     Status: None (Preliminary result)   Collection Time: 01/20/20  6:47 PM   Specimen: BLOOD  Result Value Ref Range Status   Specimen Description BLOOD SITE NOT SPECIFIED  Final   Special Requests   Final    BOTTLES DRAWN AEROBIC AND ANAEROBIC Blood Culture adequate volume   Culture   Final    NO GROWTH 4 DAYS Performed at [facility], [address], [place], [place] [address]    Report Status PENDING  Incomplete  Blood culture (routine x 2)     Status: None (Preliminary result)   Collection Time: 01/21/20  4:05 AM   Specimen: BLOOD  Result Value Ref Range Status   Specimen Description BLOOD RIGHT ANTECUBITAL  Final   Special Requests   Final    BOTTLES DRAWN AEROBIC AND ANAEROBIC Blood Culture adequate volume   Culture   Final    NO GROWTH 3 DAYS Performed at [facility], [place] [address], [place], [place] [address]    Report Status PENDING  Incomplete  Culture, bal-quantitative     Status: Abnormal   Collection Time: 01/22/20 10:09 AM   Specimen: Bronchoalveolar Lavage  Result Value Ref Range Status   Specimen Description BRONCHIAL ALVEOLAR LAVAGE  Final   Special Requests LEFT LUNG  Final   Gram Stain   Final    MODERATE WBC PRESENT, PREDOMINANTLY MONONUCLEAR NO ORGANISMS SEEN Performed at [facility], [place] [address], [place], [place] [address]    Culture 20,000 COLONIES/mL ESCHERICHIA COLI (A)  Final   Report Status 01/24/2020 FINAL  Final   Organism ID, Bacteria ESCHERICHIA COLI (A)  Final      Susceptibility   Escherichia coli - MIC*    AMPICILLIN 4 SENSITIVE Sensitive     CEFAZOLIN <=4 SENSITIVE Sensitive     CEFEPIME <=1 SENSITIVE Sensitive     CEFTAZIDIME <=1 SENSITIVE Sensitive      CEFTRIAXONE <=1 SENSITIVE Sensitive     CIPROFLOXACIN <=0.25 SENSITIVE Sensitive     GENTAMICIN <=1 SENSITIVE Sensitive     IMIPENEM <=0.25 SENSITIVE Sensitive     TRIMETH/SULFA <=20 SENSITIVE Sensitive     AMPICILLIN/SULBACTAM <=2 SENSITIVE Sensitive     PIP/TAZO <=4 SENSITIVE Sensitive     * 20,000 COLONIES/mL ESCHERICHIA COLI  SARS CORONAVIRUS 2 (TAT 6-24 HRS) Nasopharyngeal Nasopharyngeal Swab  Status: None   Collection Time: 01/23/20 12:04 PM   Specimen: Nasopharyngeal Swab  Result Value Ref Range Status   SARS Coronavirus 2 NEGATIVE NEGATIVE Final    Comment: (NOTE) SARS-CoV-2 target nucleic acids are NOT DETECTED. The SARS-CoV-2 RNA is generally detectable in upper and lower respiratory specimens during the acute phase of infection. Negative results do not preclude SARS-CoV-2 infection, do not rule out co-infections with other pathogens, and should not be used as the sole basis for treatment or other patient management decisions. Negative results must be combined with clinical observations, patient history, and epidemiological information. The expected result is Negative. Fact Sheet for Patients: SugarRoll.be Fact Sheet for Healthcare Providers: https://www.woods-mathews.com/ This test is not yet approved or cleared by the [place] FDA and  has been authorized for detection and/or diagnosis of SARS-CoV-2 by FDA under an Emergency Use Authorization (EUA). This EUA will remain  in effect (meaning this test can be used) for the duration of the COVID-19 declaration under Section 56 4(b)(1) of the Act, 21 U.S.C. section 360bbb-3(b)(1), unless the authorization is terminated or revoked sooner. Performed at [facility], [place] [address], [place], [place] [address]      Radiology Studies: No results found.  Marzetta Board, MD, PhD [facility]  Between 7 am - 7 pm I am available, please contact me via Amion or  Securechat  Between 7 pm - 7 am I am not available, please contact night coverage MD/APP via Amion

## 2020-01-25 DIAGNOSIS — N182 Chronic kidney disease, stage 2 (mild): Secondary | ICD-10-CM | POA: Diagnosis not present

## 2020-01-25 DIAGNOSIS — J690 Pneumonitis due to inhalation of food and vomit: Secondary | ICD-10-CM | POA: Diagnosis not present

## 2020-01-25 DIAGNOSIS — E876 Hypokalemia: Secondary | ICD-10-CM | POA: Diagnosis not present

## 2020-01-25 DIAGNOSIS — N179 Acute kidney failure, unspecified: Secondary | ICD-10-CM | POA: Diagnosis not present

## 2020-01-25 LAB — CULTURE, BLOOD (ROUTINE X 2)
Culture: NO GROWTH
Special Requests: ADEQUATE

## 2020-01-26 DIAGNOSIS — Z8781 Personal history of (healed) traumatic fracture: Secondary | ICD-10-CM | POA: Diagnosis not present

## 2020-01-26 DIAGNOSIS — J690 Pneumonitis due to inhalation of food and vomit: Secondary | ICD-10-CM | POA: Diagnosis not present

## 2020-01-26 DIAGNOSIS — N179 Acute kidney failure, unspecified: Secondary | ICD-10-CM | POA: Diagnosis not present

## 2020-01-26 DIAGNOSIS — E876 Hypokalemia: Secondary | ICD-10-CM | POA: Diagnosis not present

## 2020-01-26 LAB — CULTURE, BLOOD (ROUTINE X 2)
Culture: NO GROWTH
Special Requests: ADEQUATE

## 2020-01-27 DIAGNOSIS — D72829 Elevated white blood cell count, unspecified: Secondary | ICD-10-CM | POA: Diagnosis not present

## 2020-01-27 DIAGNOSIS — J690 Pneumonitis due to inhalation of food and vomit: Secondary | ICD-10-CM | POA: Diagnosis not present

## 2020-01-27 DIAGNOSIS — N179 Acute kidney failure, unspecified: Secondary | ICD-10-CM | POA: Diagnosis not present

## 2020-01-27 DIAGNOSIS — E8809 Other disorders of plasma-protein metabolism, not elsewhere classified: Secondary | ICD-10-CM | POA: Diagnosis not present

## 2020-01-30 DIAGNOSIS — R1110 Vomiting, unspecified: Secondary | ICD-10-CM | POA: Diagnosis not present

## 2020-01-30 DIAGNOSIS — F0391 Unspecified dementia with behavioral disturbance: Secondary | ICD-10-CM | POA: Diagnosis not present

## 2020-01-30 DIAGNOSIS — L270 Generalized skin eruption due to drugs and medicaments taken internally: Secondary | ICD-10-CM | POA: Diagnosis not present

## 2020-01-30 DIAGNOSIS — J690 Pneumonitis due to inhalation of food and vomit: Secondary | ICD-10-CM | POA: Diagnosis not present

## 2020-01-31 DIAGNOSIS — L270 Generalized skin eruption due to drugs and medicaments taken internally: Secondary | ICD-10-CM | POA: Diagnosis not present

## 2020-01-31 DIAGNOSIS — J690 Pneumonitis due to inhalation of food and vomit: Secondary | ICD-10-CM | POA: Diagnosis not present

## 2020-02-01 DIAGNOSIS — J690 Pneumonitis due to inhalation of food and vomit: Secondary | ICD-10-CM | POA: Diagnosis not present

## 2020-02-01 DIAGNOSIS — L270 Generalized skin eruption due to drugs and medicaments taken internally: Secondary | ICD-10-CM | POA: Diagnosis not present

## 2020-02-01 DIAGNOSIS — I5032 Chronic diastolic (congestive) heart failure: Secondary | ICD-10-CM | POA: Diagnosis not present

## 2020-02-01 DIAGNOSIS — N179 Acute kidney failure, unspecified: Secondary | ICD-10-CM | POA: Diagnosis not present

## 2020-02-02 DIAGNOSIS — J690 Pneumonitis due to inhalation of food and vomit: Secondary | ICD-10-CM | POA: Diagnosis not present

## 2020-02-02 DIAGNOSIS — N179 Acute kidney failure, unspecified: Secondary | ICD-10-CM | POA: Diagnosis not present

## 2020-02-03 DIAGNOSIS — N179 Acute kidney failure, unspecified: Secondary | ICD-10-CM | POA: Diagnosis not present

## 2020-02-03 DIAGNOSIS — J690 Pneumonitis due to inhalation of food and vomit: Secondary | ICD-10-CM | POA: Diagnosis not present

## 2020-02-03 DIAGNOSIS — R1110 Vomiting, unspecified: Secondary | ICD-10-CM | POA: Diagnosis not present

## 2020-02-03 DIAGNOSIS — I5032 Chronic diastolic (congestive) heart failure: Secondary | ICD-10-CM | POA: Diagnosis not present

## 2020-02-06 DIAGNOSIS — I5032 Chronic diastolic (congestive) heart failure: Secondary | ICD-10-CM | POA: Diagnosis not present

## 2020-02-06 DIAGNOSIS — J690 Pneumonitis due to inhalation of food and vomit: Secondary | ICD-10-CM | POA: Diagnosis not present

## 2020-02-06 DIAGNOSIS — I130 Hypertensive heart and chronic kidney disease with heart failure and stage 1 through stage 4 chronic kidney disease, or unspecified chronic kidney disease: Secondary | ICD-10-CM | POA: Diagnosis not present

## 2020-02-07 ENCOUNTER — Other Ambulatory Visit: Payer: Self-pay

## 2020-02-07 DIAGNOSIS — R627 Adult failure to thrive: Secondary | ICD-10-CM | POA: Diagnosis not present

## 2020-02-07 DIAGNOSIS — F0391 Unspecified dementia with behavioral disturbance: Secondary | ICD-10-CM | POA: Diagnosis not present

## 2020-02-07 DIAGNOSIS — N179 Acute kidney failure, unspecified: Secondary | ICD-10-CM | POA: Diagnosis not present

## 2020-02-07 DIAGNOSIS — J690 Pneumonitis due to inhalation of food and vomit: Secondary | ICD-10-CM | POA: Diagnosis not present

## 2020-02-07 NOTE — Patient Outreach (Signed)
[facility]) Care Management  02/07/2020  ROBLEY MATASSA Aug 08, 1926 [id number]     Transition of Care Referral  Referral Date: 02/07/2020 Referral Source: [facility] Discharge Report Date of Discharge: 02/04/2020 Facility: [facility] Insurance: [facility]   Referral received. Transition of care calls being completed via EMMI-automated calls. RN CM will outreach patient for any red flags received.    Plan: RN CM will close case at this time.    Antionette Fairy, RN,BSN,CCM [facility] Care Management Telephonic Care Management Coordinator Direct Phone: [phone] Toll Free: [phone] Fax: [phone]

## 2020-02-13 NOTE — Progress Notes (Signed)
This encounter was created in error - please disregard.

## 2020-02-21 DEATH — deceased
